# Patient Record
Sex: Female | Born: 1990 | Race: Black or African American | Hispanic: No | Marital: Single | Smoking: Former smoker
Health system: Southern US, Community
[De-identification: ages and names within clinical notes are randomized; demographics above are authoritative.]

## PROBLEM LIST (undated history)

## (undated) DIAGNOSIS — B9689 Other specified bacterial agents as the cause of diseases classified elsewhere: Secondary | ICD-10-CM

## (undated) DIAGNOSIS — N760 Acute vaginitis: Secondary | ICD-10-CM

## (undated) DIAGNOSIS — Z9289 Personal history of other medical treatment: Secondary | ICD-10-CM

## (undated) DIAGNOSIS — J45909 Unspecified asthma, uncomplicated: Secondary | ICD-10-CM

## (undated) DIAGNOSIS — R1115 Cyclical vomiting syndrome unrelated to migraine: Secondary | ICD-10-CM

## (undated) HISTORY — PX: UMBILICAL HERNIA REPAIR: SHX196

## (undated) HISTORY — PX: RIGHT OOPHORECTOMY: SHX2359

## (undated) HISTORY — PX: HERNIA REPAIR: SHX51

---

## 2014-07-27 ENCOUNTER — Emergency Department (HOSPITAL_COMMUNITY)
Admission: EM | Admit: 2014-07-27 | Discharge: 2014-07-27 | Disposition: A | Discharge status: Home / Self Care | Payer: Medicaid - Out of State | Attending: Emergency Medicine | Admitting: Emergency Medicine

## 2014-07-27 ENCOUNTER — Encounter (HOSPITAL_COMMUNITY): Payer: Self-pay

## 2014-07-27 ENCOUNTER — Emergency Department (HOSPITAL_COMMUNITY): Payer: Medicaid - Out of State

## 2014-07-27 DIAGNOSIS — J45909 Unspecified asthma, uncomplicated: Secondary | ICD-10-CM | POA: Diagnosis not present

## 2014-07-27 DIAGNOSIS — Z3A01 Less than 8 weeks gestation of pregnancy: Secondary | ICD-10-CM | POA: Insufficient documentation

## 2014-07-27 DIAGNOSIS — O2341 Unspecified infection of urinary tract in pregnancy, first trimester: Secondary | ICD-10-CM | POA: Diagnosis not present

## 2014-07-27 DIAGNOSIS — F17210 Nicotine dependence, cigarettes, uncomplicated: Secondary | ICD-10-CM | POA: Diagnosis not present

## 2014-07-27 DIAGNOSIS — O99511 Diseases of the respiratory system complicating pregnancy, first trimester: Secondary | ICD-10-CM | POA: Insufficient documentation

## 2014-07-27 DIAGNOSIS — Z3490 Encounter for supervision of normal pregnancy, unspecified, unspecified trimester: Secondary | ICD-10-CM

## 2014-07-27 DIAGNOSIS — O23591 Infection of other part of genital tract in pregnancy, first trimester: Secondary | ICD-10-CM | POA: Diagnosis not present

## 2014-07-27 DIAGNOSIS — O99331 Smoking (tobacco) complicating pregnancy, first trimester: Secondary | ICD-10-CM | POA: Insufficient documentation

## 2014-07-27 DIAGNOSIS — B9689 Other specified bacterial agents as the cause of diseases classified elsewhere: Secondary | ICD-10-CM

## 2014-07-27 DIAGNOSIS — O9989 Other specified diseases and conditions complicating pregnancy, childbirth and the puerperium: Secondary | ICD-10-CM | POA: Diagnosis present

## 2014-07-27 DIAGNOSIS — R102 Pelvic and perineal pain: Secondary | ICD-10-CM

## 2014-07-27 DIAGNOSIS — N390 Urinary tract infection, site not specified: Secondary | ICD-10-CM

## 2014-07-27 DIAGNOSIS — N760 Acute vaginitis: Secondary | ICD-10-CM

## 2014-07-27 HISTORY — DX: Acute vaginitis: N76.0

## 2014-07-27 HISTORY — DX: Other specified bacterial agents as the cause of diseases classified elsewhere: B96.89

## 2014-07-27 HISTORY — DX: Unspecified asthma, uncomplicated: J45.909

## 2014-07-27 LAB — URINALYSIS, ROUTINE W REFLEX MICROSCOPIC
Bilirubin Urine: NEGATIVE
GLUCOSE, UA: NEGATIVE mg/dL
Ketones, ur: NEGATIVE mg/dL
NITRITE: NEGATIVE
PH: 6 (ref 5.0–8.0)
Protein, ur: NEGATIVE mg/dL
Specific Gravity, Urine: 1.029 (ref 1.005–1.030)
Urobilinogen, UA: 0.2 mg/dL (ref 0.0–1.0)

## 2014-07-27 LAB — WET PREP, GENITAL
Trich, Wet Prep: NONE SEEN
YEAST WET PREP: NONE SEEN

## 2014-07-27 LAB — URINE MICROSCOPIC-ADD ON

## 2014-07-27 LAB — I-STAT BETA HCG BLOOD, ED (MC, WL, AP ONLY): I-stat hCG, quantitative: >2000 m[IU]/mL — ABNORMAL HIGH (ref <5)

## 2014-07-27 LAB — POC URINE PREG, ED: PREG TEST UR: POSITIVE — AB

## 2014-07-27 MED ORDER — METRONIDAZOLE 500 MG PO TABS: 500.0000 mg | ORAL_TABLET | Freq: Two times a day (BID) | ORAL | Status: DC

## 2014-07-27 MED ORDER — CEPHALEXIN 250 MG PO CAPS: 250.0000 mg | ORAL_CAPSULE | Freq: Four times a day (QID) | ORAL | Status: DC

## 2014-07-27 NOTE — ED Notes (Signed)
Pt attempted to give sample. Unsuccessful

## 2014-07-27 NOTE — ED Notes (Signed)
Pt dx with BV x 1 1/2 weeks ago.  Unable to complete meds d/t side effects.  Pt continues to have nausea and vaginal discharge.

## 2014-07-27 NOTE — ED Notes (Signed)
US at bedside

## 2014-07-27 NOTE — ED Provider Notes (Signed)
CSN: [id number]     Arrival date & time 07/27/14  [id number] History   First MD Initiated Contact with Patient 07/27/14 1105     Chief Complaint  Patient presents with  . Vaginal Discharge     (Consider location/radiation/quality/duration/timing/severity/associated sxs/prior Treatment) Patient is a 24 y.o. female presenting with vaginal discharge. The history is provided by the patient.  Vaginal Discharge Quality:  Malodorous, yellow and thick Severity:  Moderate Onset quality:  Gradual Duration:  2 weeks Timing:  Constant Progression:  Waxing and waning Chronicity:  Recurrent Context: spontaneously   Relieved by:  Nothing Worsened by:  Nothing tried Ineffective treatments: Took 3 doses of clindamycin given to her last week when she was seen in [place] and diagnosed with BV however because of the severe side effects she was only able to tolerate 3 doses. Associated symptoms: dysuria and vaginal itching   Associated symptoms: no abdominal pain, no fever, no genital lesions, no urinary hesitancy and no urinary incontinence   Risk factors: unprotected sex   Risk factors: no new sexual partner   Risk factors comment:  Prior history of BV. She was diagnosed 2 weeks ago with the same and placed on clindamycin   Past Medical History  Diagnosis Date  . Asthma   . Bacterial vaginitis    Past Surgical History  Procedure Laterality Date  . Hernia repair     History reviewed. No pertinent family history. History  Substance Use Topics  . Smoking status: Current Some Day Smoker  . Smokeless tobacco: Not on file  . Alcohol Use: No   OB History    No data available     Review of Systems  Constitutional: Negative for fever.  Gastrointestinal: Negative for abdominal pain.  Genitourinary: Positive for dysuria and vaginal discharge. Negative for bladder incontinence and hesitancy.  All other systems reviewed and are negative.     Allergies  Review of patient's allergies indicates  no known allergies.  Home Medications   Prior to Admission medications   Medication Sig Start Date End Date Taking? Authorizing Provider  clindamycin (CLEOCIN) 150 MG capsule Take 150 mg by mouth 3 (three) times daily.    Historical Provider, MD   BP 115/76 mmHg  Pulse 71  Temp(Src) 98.1 F (36.7 C) (Oral)  Resp 18  SpO2 100%  LMP 06/26/2014 Physical Exam  Constitutional: She is oriented to person, place, and time. She appears well-developed and well-nourished. No distress.  HENT:  Head: Normocephalic and atraumatic.  Mouth/Throat: Oropharynx is clear and moist.  Eyes: Conjunctivae and EOM are normal. Pupils are equal, round, and reactive to light.  Neck: Normal range of motion. Neck supple.  Cardiovascular: Normal rate, regular rhythm and intact distal pulses.   No murmur heard. Pulmonary/Chest: Effort normal and breath sounds normal. No respiratory distress. She has no wheezes. She has no rales.  Abdominal: Soft. She exhibits no distension. There is no tenderness. There is no rebound and no guarding.  Genitourinary: Uterus normal. Cervix exhibits discharge. Cervix exhibits no motion tenderness and no friability. Right adnexum displays no mass and no tenderness. Left adnexum displays no mass and no tenderness. No tenderness or bleeding in the vagina. Vaginal discharge found.  Musculoskeletal: Normal range of motion. She exhibits no edema or tenderness.  Neurological: She is alert and oriented to person, place, and time.  Skin: Skin is warm and dry. No rash noted. No erythema.  Psychiatric: She has a normal mood and affect. Her behavior is normal.  Nursing  note and vitals reviewed.   ED Course  Procedures (including critical care time) Labs Review Labs Reviewed  WET PREP, GENITAL - Abnormal; Notable for the following:    Clue Cells Wet Prep HPF POC MODERATE (*)    WBC, Wet Prep HPF POC FEW (*)    All other components within normal limits  URINALYSIS, ROUTINE W REFLEX  MICROSCOPIC (NOT AT [facility]) - Abnormal; Notable for the following:    Color, Urine AMBER (*)    APPearance CLOUDY (*)    Hgb urine dipstick SMALL (*)    Leukocytes, UA MODERATE (*)    All other components within normal limits  URINE MICROSCOPIC-ADD ON - Abnormal; Notable for the following:    Squamous Epithelial / LPF FEW (*)    All other components within normal limits  POC URINE PREG, ED - Abnormal; Notable for the following:    Preg Test, Ur POSITIVE (*)    All other components within normal limits  I-STAT BETA HCG BLOOD, ED ([facility], [facility], [facility] ONLY)  GC/CHLAMYDIA PROBE AMP ([place]) NOT AT [facility]    Imaging Review US Ob Comp Less 14 Wks  07/27/2014   CLINICAL DATA:  Pregnancy, discharge, chest pain  EXAM: OBSTETRIC <14 WK [place] AND TRANSVAGINAL OB US  TECHNIQUE: Both transabdominal and transvaginal ultrasound examinations were performed for complete evaluation of the gestation as well as the maternal uterus, adnexal regions, and pelvic cul-de-sac. Transvaginal technique was performed to assess early pregnancy.  COMPARISON:  None.  FINDINGS: Intrauterine gestational sac: Visualized/normal in shape.  Yolk sac:  Present  Embryo:  Presents  Cardiac Activity: Present  Heart Rate: 105  bpm  CRL:  3  mm   5 w   6 d                  [place] EDC: 03/23/2015  Maternal uterus/adnexae: Normal right ovary. Corpus luteum cyst in the left ovary. No adnexal mass. No pelvic free fluid. No subchorionic hemorrhage.  IMPRESSION: 1. Single live intrauterine pregnancy dating 5 weeks 6 days with an ultrasound EDC of 03/23/2015.   Electronically Signed   By: Elige Ko   On: 07/27/2014 16:02   US Ob Transvaginal  07/27/2014   CLINICAL DATA:  Pregnancy, discharge, chest pain  EXAM: OBSTETRIC <14 WK [place] AND TRANSVAGINAL OB US  TECHNIQUE: Both transabdominal and transvaginal ultrasound examinations were performed for complete evaluation of the gestation as well as the maternal uterus, adnexal regions, and pelvic cul-de-sac.  Transvaginal technique was performed to assess early pregnancy.  COMPARISON:  None.  FINDINGS: Intrauterine gestational sac: Visualized/normal in shape.  Yolk sac:  Present  Embryo:  Presents  Cardiac Activity: Present  Heart Rate: 105  bpm  CRL:  3  mm   5 w   6 d                  [place] EDC: 03/23/2015  Maternal uterus/adnexae: Normal right ovary. Corpus luteum cyst in the left ovary. No adnexal mass. No pelvic free fluid. No subchorionic hemorrhage.  IMPRESSION: 1. Single live intrauterine pregnancy dating 5 weeks 6 days with an ultrasound EDC of 03/23/2015.   Electronically Signed   By: Elige Ko   On: 07/27/2014 16:02     EKG Interpretation None      MDM   Final diagnoses:  UTI (lower urinary tract infection)  Pregnancy at early stage  Bacterial vaginosis   patient presents with ongoing vaginal discharge that has only slightly  improved for the last 2 weeks. She was seen in [place] where she normally lives prior to coming here however she was given clindamycin for BV which she can only tolerate 3 doses and she continues to have the discharge. She denies any new sexual partners but does not use protection. She denies any abdominal pain fever or vomiting but has had some mild nausea. LMP was 3 weeks ago.  On exam patient has no abdominal pain or flank tenderness. Low suspicion for pyelonephritis or PID at this time. Will repeat pelvic exam  3:00 PM Discharge without localized tenderness or concern for PID. Patient's UA is consistent with pregnancy which would explain the excessive vaginal discharge or mild nausea. Will do an ultrasound to further identify. UA also consistent with a UTI.  Will treat with abd.  HCG and U/S pending 4:10 PM Ultrasound consistent with a 5 week 6 day pregnancy. No ectopic identified. Patient treated for BV and for UTI. She can follow-up with OB when she gets back to [facility], MD 07/27/14 1610

## 2014-07-28 LAB — GC/CHLAMYDIA PROBE AMP (~~LOC~~) NOT AT ARMC
CHLAMYDIA, DNA PROBE: NEGATIVE
NEISSERIA GONORRHEA: NEGATIVE

## 2014-09-05 ENCOUNTER — Emergency Department (HOSPITAL_COMMUNITY)
Admission: EM | Admit: 2014-09-05 | Discharge: 2014-09-05 | Disposition: A | Discharge status: Home / Self Care | Payer: Medicaid - Out of State | Attending: Emergency Medicine | Admitting: Emergency Medicine

## 2014-09-05 ENCOUNTER — Emergency Department (HOSPITAL_COMMUNITY)
Admission: EM | Admit: 2014-09-05 | Discharge: 2014-09-05 | Disposition: A | Discharge status: Home / Self Care | Payer: Medicaid - Out of State | Attending: Physician Assistant | Admitting: Physician Assistant

## 2014-09-05 ENCOUNTER — Encounter (HOSPITAL_COMMUNITY): Payer: Self-pay

## 2014-09-05 DIAGNOSIS — N760 Acute vaginitis: Secondary | ICD-10-CM | POA: Insufficient documentation

## 2014-09-05 DIAGNOSIS — Z3202 Encounter for pregnancy test, result negative: Secondary | ICD-10-CM | POA: Diagnosis not present

## 2014-09-05 DIAGNOSIS — Z720 Tobacco use: Secondary | ICD-10-CM | POA: Insufficient documentation

## 2014-09-05 DIAGNOSIS — Z792 Long term (current) use of antibiotics: Secondary | ICD-10-CM | POA: Diagnosis not present

## 2014-09-05 DIAGNOSIS — B9689 Other specified bacterial agents as the cause of diseases classified elsewhere: Secondary | ICD-10-CM

## 2014-09-05 DIAGNOSIS — J45909 Unspecified asthma, uncomplicated: Secondary | ICD-10-CM | POA: Diagnosis not present

## 2014-09-05 DIAGNOSIS — R102 Pelvic and perineal pain: Secondary | ICD-10-CM | POA: Diagnosis present

## 2014-09-05 LAB — URINALYSIS, ROUTINE W REFLEX MICROSCOPIC
BILIRUBIN URINE: NEGATIVE
GLUCOSE, UA: NEGATIVE mg/dL
Hgb urine dipstick: NEGATIVE
KETONES UR: NEGATIVE mg/dL
NITRITE: NEGATIVE
PROTEIN: NEGATIVE mg/dL
Specific Gravity, Urine: 1.025 (ref 1.005–1.030)
Urobilinogen, UA: 0.2 mg/dL (ref 0.0–1.0)
pH: 7 (ref 5.0–8.0)

## 2014-09-05 LAB — WET PREP, GENITAL
Trich, Wet Prep: NONE SEEN
Yeast Wet Prep HPF POC: NONE SEEN

## 2014-09-05 LAB — URINE MICROSCOPIC-ADD ON

## 2014-09-05 LAB — PREGNANCY, URINE: Preg Test, Ur: NEGATIVE

## 2014-09-05 MED ORDER — METRONIDAZOLE 500 MG PO TABS: 500.0000 mg | ORAL_TABLET | Freq: Two times a day (BID) | ORAL | Status: DC

## 2014-09-05 NOTE — Discharge Instructions (Signed)
Bacterial Vaginosis Bacterial vaginosis is a vaginal infection that occurs when the normal balance of bacteria in the vagina is disrupted. It results from an overgrowth of certain bacteria. This is the most common vaginal infection in women of childbearing age. Treatment is important to prevent complications, especially in pregnant women, as it can cause a premature delivery. CAUSES  Bacterial vaginosis is caused by an increase in harmful bacteria that are normally present in smaller amounts in the vagina. Several different kinds of bacteria can cause bacterial vaginosis. However, the reason that the condition develops is not fully understood. RISK FACTORS Certain activities or behaviors can put you at an increased risk of developing bacterial vaginosis, including:  Having a new sex partner or multiple sex partners.  Douching.  Using an intrauterine device (IUD) for contraception. Women do not get bacterial vaginosis from toilet seats, bedding, swimming pools, or contact with objects around them. SIGNS AND SYMPTOMS  Some women with bacterial vaginosis have no signs or symptoms. Common symptoms include:  Grey vaginal discharge.  A fishlike odor with discharge, especially after sexual intercourse.  Itching or burning of the vagina and vulva.  Burning or pain with urination. DIAGNOSIS  Your health care provider will take a medical history and examine the vagina for signs of bacterial vaginosis. A sample of vaginal fluid may be taken. Your health care provider will look at this sample under a microscope to check for bacteria and abnormal cells. A vaginal pH test may also be done.  TREATMENT  Bacterial vaginosis may be treated with antibiotic medicines. These may be given in the form of a pill or a vaginal cream. A second round of antibiotics may be prescribed if the condition comes back after treatment.  HOME CARE INSTRUCTIONS   Only take over-the-counter or prescription medicines as  directed by your health care provider.  If antibiotic medicine was prescribed, take it as directed. Make sure you finish it even if you start to feel better.  Do not have sex until treatment is completed.  Tell all sexual partners that you have a vaginal infection. They should see their health care provider and be treated if they have problems, such as a mild rash or itching.  Practice safe sex by using condoms and only having one sex partner. SEEK MEDICAL CARE IF:   Your symptoms are not improving after 3 days of treatment.  You have increased discharge or pain.  You have a fever. MAKE SURE YOU:   Understand these instructions.  Will watch your condition.  Will get help right away if you are not doing well or get worse. FOR MORE INFORMATION  Centers for Disease Control and Prevention, Division of STD Prevention: www.cdc.gov/std American Sexual Health Association (ASHA): www.ashastd.org  Document Released: 12/31/2004 Document Revised: 10/21/2012 Document Reviewed: 08/12/2012 ExitCare Patient Information 2015 [employer]. This information is not intended to replace advice given to you by your health care provider. Make sure you discuss any questions you have with your health care provider.  

## 2014-09-05 NOTE — ED Notes (Signed)
Pt is prone to getting bv with swimming, change in soaps.  Pt having same symptoms.  Burning, itching pain.  Worsening last night.

## 2014-09-05 NOTE — ED Notes (Signed)
Called patient in waiting room multiple times with no answer.

## 2014-09-05 NOTE — ED Notes (Signed)
Called out patient to be triaged. No response

## 2014-09-05 NOTE — ED Provider Notes (Signed)
CSN: [id number]     Arrival date & time 09/05/14  1615 History   First MD Initiated Contact with Patient 09/05/14 1856     Chief Complaint  Patient presents with  . Vaginal Pain     (Consider location/radiation/quality/duration/timing/severity/associated sxs/prior Treatment) HPI Comments: Patient presents to the emergency department with a chief complaint of vaginal discharge, irritation, and history of bacterial vaginitis. Patient states that she began having the symptoms over the past 2 days. It worsened last night. She reports some associated dysuria. She believes that she got this from swimming. She is sexually active. She denies any new sexual partners. There are no aggravating or relieving factors. She denies any fevers, chills, nausea, vomiting, or abdominal pain. She also reports that she has a history of gonorrhea.  The history is provided by the patient. No language interpreter was used.    Past Medical History  Diagnosis Date  . Asthma   . Bacterial vaginitis    Past Surgical History  Procedure Laterality Date  . Hernia repair     History reviewed. No pertinent family history. Social History  Substance Use Topics  . Smoking status: Current Some Day Smoker  . Smokeless tobacco: None  . Alcohol Use: No   OB History    No data available     Review of Systems  Constitutional: Negative for fever and chills.  Respiratory: Negative for shortness of breath.   Cardiovascular: Negative for chest pain.  Gastrointestinal: Negative for nausea, vomiting, diarrhea and constipation.  Genitourinary: Positive for dysuria and vaginal discharge.  All other systems reviewed and are negative.     Allergies  Review of patient's allergies indicates no known allergies.  Home Medications   Prior to Admission medications   Medication Sig Start Date End Date Taking? Authorizing Provider  cephALEXin (KEFLEX) 250 MG capsule Take 1 capsule (250 mg total) by mouth 4 (four) times  daily. 07/27/14   Gwyneth Sprout, MD  clindamycin (CLEOCIN) 150 MG capsule Take 150 mg by mouth 3 (three) times daily.    Historical Provider, MD  metroNIDAZOLE (FLAGYL) 500 MG tablet Take 1 tablet (500 mg total) by mouth 2 (two) times daily. 07/27/14   Gwyneth Sprout, MD   BP 103/67 mmHg  Pulse 88  Temp(Src) 97.9 F (36.6 C) (Oral)  Resp 16  SpO2 100%  LMP 08/08/2014 Physical Exam  Constitutional: She is oriented to person, place, and time. She appears well-developed and well-nourished.  HENT:  Head: Normocephalic and atraumatic.  Eyes: Conjunctivae and EOM are normal. Pupils are equal, round, and reactive to light.  Neck: Normal range of motion. Neck supple.  Cardiovascular: Normal rate and regular rhythm.  Exam reveals no gallop and no friction rub.   No murmur heard. Pulmonary/Chest: Effort normal and breath sounds normal. No respiratory distress. She has no wheezes. She has no rales. She exhibits no tenderness.  Abdominal: Soft. Bowel sounds are normal. She exhibits no distension and no mass. There is no tenderness. There is no rebound and no guarding.  Genitourinary:  Pelvic exam chaperoned by female ER tech, no right or left adnexal tenderness, no uterine tenderness, moderate vaginal discharge, no bleeding, no CMT or friability, no foreign body, no injury to the external genitalia, no other significant findings   Musculoskeletal: Normal range of motion. She exhibits no edema or tenderness.  Neurological: She is alert and oriented to person, place, and time.  Skin: Skin is warm and dry.  Psychiatric: She has a normal mood and affect.  Her behavior is normal. Judgment and thought content normal.  Nursing note and vitals reviewed.   ED Course  Procedures (including critical care time) Labs Review Labs Reviewed  WET PREP, GENITAL  URINALYSIS, ROUTINE W REFLEX MICROSCOPIC (NOT AT [facility])  PREGNANCY, URINE  GC/CHLAMYDIA PROBE AMP ([place]) NOT AT [facility]    Imaging  Review No results found. I have personally reviewed and evaluated these images and lab results as part of my medical decision-making.   EKG Interpretation None      MDM   Final diagnoses:  BV (bacterial vaginosis)    Patient with vaginal discharge and dysuria, will check urinalysis and pelvic exam.  Wet prep remarkable for moderate clue cells. Will discharge with metronidazole. Patient does not have any abdominal pain on exam. Vital signs are stable. Discharge to home. Instructed to follow-up with OB/GYN. Patient understands and agrees to plan. She is stable and ready for discharge.    Roxy Horseman, PA-C 09/05/14 2342  Courteney Randall An, MD 09/05/14 1610

## 2014-09-06 LAB — GC/CHLAMYDIA PROBE AMP (~~LOC~~) NOT AT ARMC
CHLAMYDIA, DNA PROBE: NEGATIVE
NEISSERIA GONORRHEA: NEGATIVE

## 2014-11-07 ENCOUNTER — Emergency Department (HOSPITAL_COMMUNITY)
Admission: EM | Admit: 2014-11-07 | Discharge: 2014-11-07 | Disposition: A | Discharge status: Home / Self Care | Payer: Medicaid - Out of State | Attending: Emergency Medicine | Admitting: Emergency Medicine

## 2014-11-07 ENCOUNTER — Encounter (HOSPITAL_COMMUNITY): Payer: Self-pay | Admitting: Cardiology

## 2014-11-07 DIAGNOSIS — N760 Acute vaginitis: Secondary | ICD-10-CM

## 2014-11-07 DIAGNOSIS — B373 Candidiasis of vulva and vagina: Secondary | ICD-10-CM | POA: Diagnosis not present

## 2014-11-07 DIAGNOSIS — B9689 Other specified bacterial agents as the cause of diseases classified elsewhere: Secondary | ICD-10-CM

## 2014-11-07 DIAGNOSIS — Z87891 Personal history of nicotine dependence: Secondary | ICD-10-CM | POA: Diagnosis not present

## 2014-11-07 DIAGNOSIS — L293 Anogenital pruritus, unspecified: Secondary | ICD-10-CM | POA: Diagnosis present

## 2014-11-07 DIAGNOSIS — J45909 Unspecified asthma, uncomplicated: Secondary | ICD-10-CM | POA: Diagnosis not present

## 2014-11-07 DIAGNOSIS — B379 Candidiasis, unspecified: Secondary | ICD-10-CM

## 2014-11-07 LAB — URINALYSIS, ROUTINE W REFLEX MICROSCOPIC
Bilirubin Urine: NEGATIVE
GLUCOSE, UA: NEGATIVE mg/dL
Ketones, ur: NEGATIVE mg/dL
Nitrite: NEGATIVE
PH: 8 (ref 5.0–8.0)
Protein, ur: NEGATIVE mg/dL
SPECIFIC GRAVITY, URINE: 1.011 (ref 1.005–1.030)
Urobilinogen, UA: 0.2 mg/dL (ref 0.0–1.0)

## 2014-11-07 LAB — URINE MICROSCOPIC-ADD ON

## 2014-11-07 LAB — WET PREP, GENITAL: TRICH WET PREP: NONE SEEN

## 2014-11-07 LAB — POC URINE PREG, ED: Preg Test, Ur: POSITIVE — AB

## 2014-11-07 MED ORDER — FLUCONAZOLE 100 MG PO TABS
150.0000 mg | ORAL_TABLET | Freq: Every day | ORAL | Status: DC
  Administered 2014-11-07: 150 mg via ORAL
  Filled 2014-11-07: qty 2

## 2014-11-07 MED ORDER — METRONIDAZOLE 500 MG PO TABS: 500.0000 mg | ORAL_TABLET | Freq: Two times a day (BID) | ORAL | Status: DC

## 2014-11-07 NOTE — ED Notes (Signed)
Per pt, pt had miscarriage recently and had D&C last week.

## 2014-11-07 NOTE — ED Notes (Signed)
Reports vaginal itching that started yesterday after she went swimming.

## 2014-11-07 NOTE — ED Provider Notes (Signed)
CSN: [id number]     Arrival date & time 11/07/14  1000 History   First MD Initiated Contact with Patient 11/07/14 1113     Chief Complaint  Patient presents with  . Vaginal Itching   HPI  Ms. Krista Stout is a 24 year old female with PMHx of asthma presenting with vaginal itching and discharge. Pt states these symptoms began yesterday after swimming. Discharge is yellow and thin. She states that she frequently get BV and this feels similar. She has not tried any OTC symptom relievers. She denies fever, chills, abdominal pain, nausea, vomiting, diarrhea, dysuria or hematuria. Pt reports that she had a miscarriage last week and had a D&C performed at the time. She reports no complications from the procedure except for occasional light spotting.   Past Medical History  Diagnosis Date  . Asthma   . Bacterial vaginitis    Past Surgical History  Procedure Laterality Date  . Hernia repair     History reviewed. No pertinent family history. Social History  Substance Use Topics  . Smoking status: Former Games developer  . Smokeless tobacco: None  . Alcohol Use: No   OB History    No data available     Review of Systems  Constitutional: Negative for fever and chills.  Gastrointestinal: Negative for nausea, vomiting and abdominal pain.  Genitourinary: Positive for vaginal bleeding, vaginal discharge and vaginal pain (itching). Negative for dysuria, hematuria and flank pain.  Skin: Negative for rash.      Allergies  Review of patient's allergies indicates no known allergies.  Home Medications   Prior to Admission medications   Medication Sig Start Date End Date Taking? Authorizing Provider  cephALEXin (KEFLEX) 250 MG capsule Take 1 capsule (250 mg total) by mouth 4 (four) times daily. Patient not taking: Reported on 09/05/2014 07/27/14   Gwyneth Sprout, MD  metroNIDAZOLE (FLAGYL) 500 MG tablet Take 1 tablet (500 mg total) by mouth 2 (two) times daily. 11/07/14   Rebeca Valdivia, PA-C   BP 115/64  mmHg  Pulse 61  Temp(Src) 98.5 F (36.9 C) (Oral)  Resp 17  Ht  (1.575 m)  Wt 134 lb (60.782 kg)  BMI 24.50 kg/m2  SpO2 100%  LMP 11/05/2014 Physical Exam  Constitutional: She appears well-developed and well-nourished. No distress.  HENT:  Head: Normocephalic and atraumatic.  Eyes: Conjunctivae are normal. Right eye exhibits no discharge. Left eye exhibits no discharge. No scleral icterus.  Neck: Normal range of motion.  Cardiovascular: Normal rate and regular rhythm.   Pulmonary/Chest: Effort normal. No respiratory distress.  Abdominal: Soft. Bowel sounds are normal. She exhibits no distension. There is no tenderness. There is no rebound and no guarding.  Genitourinary: There is no rash or lesion on the right labia. There is no rash or lesion on the left labia. Cervix exhibits no motion tenderness, no discharge and no friability. Right adnexum displays no mass and no tenderness. Left adnexum displays no mass and no tenderness. No erythema or bleeding in the vagina. Vaginal discharge (thin, white) found.  Musculoskeletal: Normal range of motion.  Moves extremities spontaneously  Neurological: She is alert. Coordination normal.  Skin: Skin is warm and dry.  Psychiatric: She has a normal mood and affect. Her behavior is normal.  Nursing note and vitals reviewed.   ED Course  Procedures (including critical care time) Labs Review Labs Reviewed  WET PREP, GENITAL - Abnormal; Notable for the following:    Yeast Wet Prep HPF POC FEW (*)  Clue Cells Wet Prep HPF POC FEW (*)    WBC, Wet Prep HPF POC FEW (*)    All other components within normal limits  URINALYSIS, ROUTINE W REFLEX MICROSCOPIC (NOT AT [facility]) - Abnormal; Notable for the following:    Hgb urine dipstick TRACE (*)    Leukocytes, UA SMALL (*)    All other components within normal limits  POC URINE PREG, ED - Abnormal; Notable for the following:    Preg Test, Ur POSITIVE (*)    All other components within normal  limits  URINE MICROSCOPIC-ADD ON  GC/CHLAMYDIA PROBE AMP ([place]) NOT AT [facility]    Imaging Review No results found. I have personally reviewed and evaluated these images and lab results as part of my medical decision-making.   EKG Interpretation None      MDM   Final diagnoses:  BV (bacterial vaginosis)  Yeast infection   Pt presenting with 1 day of vaginal itching and discharge. Pt has hx of frequent BV and states this feels similar. Also, recent history of miscarriage s/p D&C. VSS. Pt non-toxic appearing. Abdomen is soft, non-tender without peritoneal signs. On pelvic, white, thin vaginal discharge present. No CMT or vaginal bleeding noted. Wet prep positive for clue cells and yeast. Treated with 150 mg diflucan in ED. Will treat BV with flagyl. Return precautions given in discharge paperwork and discussed with pt at bedside. Pt stable for discharge     Alveta HeimlichStevi Zuleyka Kloc, PA-C 11/07/14 1357  Elwin MochaBlair Walden, MD 11/07/14 [phone]

## 2014-11-07 NOTE — Discharge Instructions (Signed)

## 2014-11-07 NOTE — ED Notes (Signed)
Pt from home for eval of vaginal discharge, itching, and discomfort in vagina. Pt states went swimming yesterday and that's when the symtpoms began. Pt states has had BV in the past and feels that this is same. nad noted. Denies any abd pain. No n/v/d .

## 2014-11-08 LAB — GC/CHLAMYDIA PROBE AMP (~~LOC~~) NOT AT ARMC
Chlamydia: NEGATIVE
Neisseria Gonorrhea: NEGATIVE

## 2015-04-15 ENCOUNTER — Encounter (HOSPITAL_COMMUNITY): Payer: Self-pay | Admitting: Emergency Medicine

## 2015-04-15 ENCOUNTER — Emergency Department (HOSPITAL_COMMUNITY)
Admission: EM | Admit: 2015-04-15 | Discharge: 2015-04-15 | Disposition: A | Discharge status: Home / Self Care | Payer: Medicaid - Out of State | Attending: Emergency Medicine | Admitting: Emergency Medicine

## 2015-04-15 DIAGNOSIS — Z8742 Personal history of other diseases of the female genital tract: Secondary | ICD-10-CM | POA: Diagnosis not present

## 2015-04-15 DIAGNOSIS — Z3202 Encounter for pregnancy test, result negative: Secondary | ICD-10-CM | POA: Insufficient documentation

## 2015-04-15 DIAGNOSIS — R109 Unspecified abdominal pain: Secondary | ICD-10-CM | POA: Diagnosis present

## 2015-04-15 DIAGNOSIS — R110 Nausea: Secondary | ICD-10-CM | POA: Diagnosis not present

## 2015-04-15 DIAGNOSIS — J45909 Unspecified asthma, uncomplicated: Secondary | ICD-10-CM | POA: Insufficient documentation

## 2015-04-15 DIAGNOSIS — Z87891 Personal history of nicotine dependence: Secondary | ICD-10-CM | POA: Insufficient documentation

## 2015-04-15 DIAGNOSIS — Z9889 Other specified postprocedural states: Secondary | ICD-10-CM | POA: Insufficient documentation

## 2015-04-15 DIAGNOSIS — R1084 Generalized abdominal pain: Secondary | ICD-10-CM | POA: Insufficient documentation

## 2015-04-15 LAB — CBC WITH DIFFERENTIAL/PLATELET
BASOS PCT: 1 %
Basophils Absolute: 0 10*3/uL (ref 0.0–0.1)
EOS ABS: 0.1 10*3/uL (ref 0.0–0.7)
EOS PCT: 2 %
HEMATOCRIT: 39.6 % (ref 36.0–46.0)
Hemoglobin: 13.7 g/dL (ref 12.0–15.0)
Lymphocytes Relative: 37 %
Lymphs Abs: 3 10*3/uL (ref 0.7–4.0)
MCH: 31.4 pg (ref 26.0–34.0)
MCHC: 34.6 g/dL (ref 30.0–36.0)
MCV: 90.8 fL (ref 78.0–100.0)
MONO ABS: 0.5 10*3/uL (ref 0.1–1.0)
MONOS PCT: 6 %
Neutro Abs: 4.5 10*3/uL (ref 1.7–7.7)
Neutrophils Relative %: 56 %
Platelets: 182 10*3/uL (ref 150–400)
RBC: 4.36 MIL/uL (ref 3.87–5.11)
RDW: 12.8 % (ref 11.5–15.5)
WBC: 8.1 10*3/uL (ref 4.0–10.5)

## 2015-04-15 LAB — URINALYSIS, ROUTINE W REFLEX MICROSCOPIC
Bilirubin Urine: NEGATIVE
Glucose, UA: NEGATIVE mg/dL
LEUKOCYTES UA: NEGATIVE
NITRITE: NEGATIVE
PH: 6 (ref 5.0–8.0)
PROTEIN: NEGATIVE mg/dL
Specific Gravity, Urine: 1.027 (ref 1.005–1.030)

## 2015-04-15 LAB — COMPREHENSIVE METABOLIC PANEL
ALBUMIN: 4.4 g/dL (ref 3.5–5.0)
ALT: 15 U/L (ref 14–54)
ANION GAP: 8 (ref 5–15)
AST: 21 U/L (ref 15–41)
Alkaline Phosphatase: 56 U/L (ref 38–126)
BILIRUBIN TOTAL: 0.4 mg/dL (ref 0.3–1.2)
BUN: 12 mg/dL (ref 6–20)
CO2: 24 mmol/L (ref 22–32)
Calcium: 8.8 mg/dL — ABNORMAL LOW (ref 8.9–10.3)
Chloride: 107 mmol/L (ref 101–111)
Creatinine, Ser: 0.83 mg/dL (ref 0.44–1.00)
GFR calc Af Amer: >60 mL/min (ref >60)
GFR calc non Af Amer: >60 mL/min (ref >60)
GLUCOSE: 129 mg/dL — AB (ref 65–99)
POTASSIUM: 3.4 mmol/L — AB (ref 3.5–5.1)
SODIUM: 139 mmol/L (ref 135–145)
TOTAL PROTEIN: 7.4 g/dL (ref 6.5–8.1)

## 2015-04-15 LAB — URINE MICROSCOPIC-ADD ON: SQUAMOUS EPITHELIAL / LPF: NONE SEEN

## 2015-04-15 LAB — I-STAT BETA HCG BLOOD, ED (MC, WL, AP ONLY): I-stat hCG, quantitative: <5 m[IU]/mL (ref <5)

## 2015-04-15 LAB — LIPASE, BLOOD: Lipase: 23 U/L (ref 11–51)

## 2015-04-15 MED ORDER — ONDANSETRON HCL 4 MG/2ML IJ SOLN
4.0000 mg | Freq: Once | INTRAMUSCULAR | Status: AC
  Administered 2015-04-15: 4 mg via INTRAVENOUS
  Filled 2015-04-15: qty 2

## 2015-04-15 MED ORDER — SODIUM CHLORIDE 0.9 % IV BOLUS (SEPSIS)
1000.0000 mL | Freq: Once | INTRAVENOUS | Status: AC
  Administered 2015-04-15: 1000 mL via INTRAVENOUS

## 2015-04-15 NOTE — ED Notes (Signed)
Awake. Verbally responsive. A/O x4. Resp even and unlabored. No audible adventitious breath sounds noted. ABC's intact.  

## 2015-04-15 NOTE — ED Provider Notes (Signed)
CSN: [id number]     Arrival date & time 04/15/15  [id number] History   First MD Initiated Contact with Patient 04/15/15 0818     Chief Complaint  Patient presents with  . Abdominal Pain     (Consider location/radiation/quality/duration/timing/severity/associated sxs/prior Treatment) HPI Comments: Patient presents with abdominal pain. She is a 25 year old female patient who arrived via EMS. She states that about 15-20 minutes ago she started having abdominal pain that's completely throughout her abdomen. She states it hurts all the way from her boobs, and to her bladder. She's had some nausea but no vomiting. She's having normal bowel movements. No fevers. No urinary symptoms. She denies any vaginal bleeding or discharge. She did not take anything for the pain.   Past Medical History  Diagnosis Date  . Asthma   . Bacterial vaginitis    Past Surgical History  Procedure Laterality Date  . Hernia repair     History reviewed. No pertinent family history. Social History  Substance Use Topics  . Smoking status: Former Games developermoker  . Smokeless tobacco: None  . Alcohol Use: No   OB History    No data available     Review of Systems  Constitutional: Negative for fever, chills, diaphoresis and fatigue.  HENT: Negative for congestion, rhinorrhea and sneezing.   Eyes: Negative.   Respiratory: Negative for cough, chest tightness and shortness of breath.   Cardiovascular: Negative for chest pain and leg swelling.  Gastrointestinal: Positive for nausea and abdominal pain. Negative for vomiting, diarrhea and blood in stool.  Genitourinary: Negative for frequency, hematuria, flank pain and difficulty urinating.  Musculoskeletal: Negative for back pain and arthralgias.  Skin: Negative for rash.  Neurological: Negative for dizziness, speech difficulty, weakness, numbness and headaches.      Allergies  Review of patient's allergies indicates no known allergies.  Home Medications   Prior to  Admission medications   Not on File   BP 113/68 mmHg  Pulse 63  Temp(Src) 98.2 F (36.8 C) (Oral)  Resp 16  SpO2 100%  LMP 04/01/2015 (Exact Date) Physical Exam  Constitutional: She is oriented to person, place, and time. She appears well-developed and well-nourished.  HENT:  Head: Normocephalic and atraumatic.  Eyes: Pupils are equal, round, and reactive to light.  Neck: Normal range of motion. Neck supple.  Cardiovascular: Normal rate, regular rhythm and normal heart sounds.   Pulmonary/Chest: Effort normal and breath sounds normal. No respiratory distress. She has no wheezes. She has no rales. She exhibits no tenderness.  Abdominal: Soft. Bowel sounds are normal. There is tenderness (Mild diffuse tenderness throughout the abdomen). There is no rebound and no guarding.  Musculoskeletal: Normal range of motion. She exhibits no edema.  Lymphadenopathy:    She has no cervical adenopathy.  Neurological: She is alert and oriented to person, place, and time.  Skin: Skin is warm and dry. No rash noted.  Psychiatric: She has a normal mood and affect.    ED Course  Procedures (including critical care time) Labs Review Labs Reviewed  COMPREHENSIVE METABOLIC PANEL - Abnormal; Notable for the following:    Potassium 3.4 (*)    Glucose, Bld 129 (*)    Calcium 8.8 (*)    All other components within normal limits  URINALYSIS, ROUTINE W REFLEX MICROSCOPIC (NOT AT [facility]) - Abnormal; Notable for the following:    Hgb urine dipstick TRACE (*)    Ketones, ur >80 (*)    All other components within normal limits  URINE MICROSCOPIC-ADD  ON - Abnormal; Notable for the following:    Bacteria, UA RARE (*)    All other components within normal limits  CBC WITH DIFFERENTIAL/PLATELET  LIPASE, BLOOD  I-STAT BETA HCG BLOOD, ED ([facility], [facility], [facility] ONLY)    Imaging Review No results found. I have personally reviewed and evaluated these images and lab results as part of my medical decision-making.   EKG  Interpretation None      MDM   Final diagnoses:  Generalized abdominal pain    Patient presents with generalized abdominal pain that started 15-20 minutes prior to arrival. Her labs are unremarkable. She was given a dose of Zofran and states she is feeling completely better. Her abdominal exam is benign without tenderness. Her urine is negative. Her pregnancy test is negative. She denies any vaginal complaints. She was discharged home in good condition. She was advised to use a clear liquid diet for the next 24 hours. She was advised to return here if she has any worsening symptoms.    Rolan Bucco, MD 04/15/15 1038

## 2015-04-15 NOTE — Discharge Instructions (Signed)

## 2015-04-15 NOTE — ED Notes (Signed)
Bed: WA07 Expected date: 04/15/15 Expected time: 7:56 AM Means of arrival: Ambulance Comments: Abd pain

## 2015-04-15 NOTE — ED Notes (Signed)
Pt arrived via EMS with report of generalized abd pain with n/v x1 and denies diarrhea and fever.

## 2015-04-15 NOTE — ED Notes (Signed)
PT aware of urine sample 

## 2015-08-12 ENCOUNTER — Emergency Department (HOSPITAL_COMMUNITY)
Admission: EM | Admit: 2015-08-12 | Discharge: 2015-08-12 | Disposition: A | Discharge status: Home / Self Care | Payer: Medicaid - Out of State | Attending: Emergency Medicine | Admitting: Emergency Medicine

## 2015-08-12 ENCOUNTER — Encounter (HOSPITAL_COMMUNITY): Payer: Self-pay

## 2015-08-12 DIAGNOSIS — Z3A08 8 weeks gestation of pregnancy: Secondary | ICD-10-CM | POA: Diagnosis not present

## 2015-08-12 DIAGNOSIS — Z87891 Personal history of nicotine dependence: Secondary | ICD-10-CM | POA: Insufficient documentation

## 2015-08-12 DIAGNOSIS — R197 Diarrhea, unspecified: Secondary | ICD-10-CM | POA: Insufficient documentation

## 2015-08-12 DIAGNOSIS — J45909 Unspecified asthma, uncomplicated: Secondary | ICD-10-CM | POA: Diagnosis not present

## 2015-08-12 DIAGNOSIS — O219 Vomiting of pregnancy, unspecified: Secondary | ICD-10-CM | POA: Diagnosis present

## 2015-08-12 DIAGNOSIS — O210 Mild hyperemesis gravidarum: Secondary | ICD-10-CM | POA: Insufficient documentation

## 2015-08-12 LAB — URINALYSIS, ROUTINE W REFLEX MICROSCOPIC
GLUCOSE, UA: NEGATIVE mg/dL
HGB URINE DIPSTICK: NEGATIVE
Ketones, ur: >80 mg/dL — AB
Leukocytes, UA: NEGATIVE
Nitrite: NEGATIVE
PH: 6 (ref 5.0–8.0)
Protein, ur: 30 mg/dL — AB
SPECIFIC GRAVITY, URINE: 1.037 — AB (ref 1.005–1.030)

## 2015-08-12 LAB — URINE MICROSCOPIC-ADD ON

## 2015-08-12 LAB — COMPREHENSIVE METABOLIC PANEL
ALT: 19 U/L (ref 14–54)
AST: 22 U/L (ref 15–41)
Albumin: 4.8 g/dL (ref 3.5–5.0)
Alkaline Phosphatase: 64 U/L (ref 38–126)
Anion gap: 14 (ref 5–15)
BILIRUBIN TOTAL: 1.2 mg/dL (ref 0.3–1.2)
BUN: 7 mg/dL (ref 6–20)
CHLORIDE: 102 mmol/L (ref 101–111)
CO2: 21 mmol/L — ABNORMAL LOW (ref 22–32)
Calcium: 9.9 mg/dL (ref 8.9–10.3)
Creatinine, Ser: 0.79 mg/dL (ref 0.44–1.00)
Glucose, Bld: 92 mg/dL (ref 65–99)
POTASSIUM: 3.1 mmol/L — AB (ref 3.5–5.1)
Sodium: 137 mmol/L (ref 135–145)
TOTAL PROTEIN: 8 g/dL (ref 6.5–8.1)

## 2015-08-12 LAB — CBC
HEMATOCRIT: 46.2 % — AB (ref 36.0–46.0)
Hemoglobin: 16.3 g/dL — ABNORMAL HIGH (ref 12.0–15.0)
MCH: 32.3 pg (ref 26.0–34.0)
MCHC: 35.3 g/dL (ref 30.0–36.0)
MCV: 91.7 fL (ref 78.0–100.0)
PLATELETS: 264 10*3/uL (ref 150–400)
RBC: 5.04 MIL/uL (ref 3.87–5.11)
RDW: 12.5 % (ref 11.5–15.5)
WBC: 7.6 10*3/uL (ref 4.0–10.5)

## 2015-08-12 LAB — CBG MONITORING, ED: GLUCOSE-CAPILLARY: 106 mg/dL — AB (ref 65–99)

## 2015-08-12 LAB — LIPASE, BLOOD: LIPASE: 18 U/L (ref 11–51)

## 2015-08-12 LAB — I-STAT BETA HCG BLOOD, ED (MC, WL, AP ONLY)

## 2015-08-12 LAB — HCG, QUANTITATIVE, PREGNANCY: hCG, Beta Chain, Quant, S: 59893 m[IU]/mL — ABNORMAL HIGH (ref <5)

## 2015-08-12 MED ORDER — ONDANSETRON 4 MG PO TBDP: 4.0000 mg | ORAL_TABLET | Freq: Three times a day (TID) | ORAL | 0 refills | Status: DC | PRN

## 2015-08-12 MED ORDER — ONDANSETRON HCL 4 MG/2ML IJ SOLN
4.0000 mg | Freq: Once | INTRAMUSCULAR | Status: AC
  Administered 2015-08-12: 4 mg via INTRAVENOUS
  Filled 2015-08-12: qty 2

## 2015-08-12 MED ORDER — SODIUM CHLORIDE 0.9 % IV BOLUS (SEPSIS)
1000.0000 mL | Freq: Once | INTRAVENOUS | Status: AC
  Administered 2015-08-12: 1000 mL via INTRAVENOUS

## 2015-08-12 MED ORDER — POTASSIUM CHLORIDE CRYS ER 20 MEQ PO TBCR
40.0000 meq | EXTENDED_RELEASE_TABLET | Freq: Once | ORAL | Status: AC
  Administered 2015-08-12: 40 meq via ORAL
  Filled 2015-08-12: qty 2

## 2015-08-12 MED ORDER — METOCLOPRAMIDE HCL 10 MG PO TABS: 10.0000 mg | ORAL_TABLET | Freq: Four times a day (QID) | ORAL | 0 refills | Status: DC | PRN

## 2015-08-12 MED ORDER — ONDANSETRON HCL 4 MG/2ML IJ SOLN: 4.0000 mg | Freq: Once | INTRAMUSCULAR | Status: DC

## 2015-08-12 NOTE — ED Notes (Signed)
Pt is in stable condition upon d/c and ambulates from ED. 

## 2015-08-12 NOTE — ED Triage Notes (Addendum)
Patient here complaining of weakness with vomiting and diarrhea x 4 days. States that she is 6-[redacted] weeks pregnant and cant keep anything down. Dry heaves on arrival,.skin warm and dry, family with patient

## 2015-08-12 NOTE — Discharge Instructions (Signed)
Call the [facility] outpatient clinic to schedule a follow up appointment as soon as possible. I gave you two different prescriptions for nausea medicine. Zofran is what you had in the emergency room today. However, if it is too expensive, fill the Reglan which is on the $4 list. Return to the ER for new or worsening symptoms.

## 2015-08-12 NOTE — ED Notes (Signed)
Gave Pt apple juice per [place] PA

## 2015-08-12 NOTE — ED Provider Notes (Signed)
[facility] Provider Note   CSN: [id number] Arrival date & time: 08/12/15  1209  First Provider Contact:  First MD Initiated Contact with Patient 08/12/15 1350     History   Chief Complaint Chief Complaint  Patient presents with  . Emesis  . Diarrhea   HPI  Krista Stout is an 25 y.o. female G2P1 currently [redacted] weeks pregnant who presents to the ED for evaluation of nausea and vomiting. She states she has not been able to tolerate anything PO for the past five days. She states she has been throwing up everything and even certain tastes or smells make her nauseated. Denies diarrhea. Denies abdominal pain. Denies vaginal bleeding. She denies feeling faint or lightheaded. She recently moved to the area from [place] and has not established OB care locally yet.  Past Medical History:  Diagnosis Date  . Asthma   . Bacterial vaginitis     There are no active problems to display for this patient.   Past Surgical History:  Procedure Laterality Date  . HERNIA REPAIR      OB History    Gravida Para Term Preterm AB Living   1             SAB TAB Ectopic Multiple Live Births                   Home Medications    Prior to Admission medications   Not on File    Family History No family history on file.  Social History Social History  Substance Use Topics  . Smoking status: Former Games developer  . Smokeless tobacco: Never Used  . Alcohol use No     Allergies   Review of patient's allergies indicates no known allergies.   Review of Systems Review of Systems 10 Systems reviewed and are negative for acute change except as noted in the HPI.   Physical Exam Updated Vital Signs BP 120/66   Pulse 89   Temp 98.3 F (36.8 C) (Oral)   Resp 18   LMP 06/27/2015   SpO2 100%   Physical Exam  Constitutional: She is oriented to person, place, and time.  HENT:  Right Ear: External ear normal.  Left Ear: External ear normal.  Nose: Nose normal.  Mouth/Throat:  Oropharynx is clear and moist. No oropharyngeal exudate.  Eyes: Conjunctivae are normal.  Neck: Neck supple.  Cardiovascular: Normal rate, regular rhythm, normal heart sounds and intact distal pulses.   Pulmonary/Chest: Effort normal and breath sounds normal. No respiratory distress. She has no wheezes.  Abdominal: Soft. Bowel sounds are normal. She exhibits no distension. There is no tenderness. There is no rebound and no guarding.  Musculoskeletal: She exhibits no edema.  Neurological: She is alert and oriented to person, place, and time. No cranial nerve deficit.  Skin: Skin is warm and dry.  Psychiatric: She has a normal mood and affect.  Nursing note and vitals reviewed.    ED Treatments / Results  Labs (all labs ordered are listed, but only abnormal results are displayed) Labs Reviewed  COMPREHENSIVE METABOLIC PANEL - Abnormal; Notable for the following:       Result Value   Potassium 3.1 (*)    CO2 21 (*)    All other components within normal limits  CBC - Abnormal; Notable for the following:    Hemoglobin 16.3 (*)    HCT 46.2 (*)    All other components within normal limits  URINALYSIS, ROUTINE W REFLEX MICROSCOPIC (  NOT AT [facility]) - Abnormal; Notable for the following:    Color, Urine AMBER (*)    APPearance TURBID (*)    Specific Gravity, Urine 1.037 (*)    Bilirubin Urine SMALL (*)    Ketones, ur >80 (*)    Protein, ur 30 (*)    All other components within normal limits  HCG, QUANTITATIVE, PREGNANCY - Abnormal; Notable for the following:    hCG, Beta Chain, Quant, S 88,891 (*)    All other components within normal limits  URINE MICROSCOPIC-ADD ON - Abnormal; Notable for the following:    Squamous Epithelial / LPF TOO NUMEROUS TO COUNT (*)    Bacteria, UA MANY (*)    All other components within normal limits  I-STAT BETA HCG BLOOD, ED ([facility], [facility], [facility] ONLY) - Abnormal; Notable for the following:    I-stat hCG, quantitative >2,000.0 (*)    All other components within  normal limits  CBG MONITORING, ED - Abnormal; Notable for the following:    Glucose-Capillary 106 (*)    All other components within normal limits  LIPASE, BLOOD    EKG  EKG Interpretation None       Radiology No results found.  Procedures Procedures (including critical care time)  Medications Ordered in ED Medications  sodium chloride 0.9 % bolus 1,000 mL (0 mLs Intravenous Stopped 08/12/15 1522)  ondansetron (ZOFRAN) injection 4 mg (4 mg Intravenous Given 08/12/15 1420)  potassium chloride SA (K-DUR,KLOR-CON) CR tablet 40 mEq (40 mEq Oral Given 08/12/15 1420)     Initial Impression / Assessment and Plan / ED Course  I have reviewed the triage vital signs and the nursing notes.  Pertinent labs & imaging results that were available during my care of the patient were reviewed by me and considered in my medical decision making (see chart for details).  Clinical Course   Potassium repleted in the ED. Pt feels improved after anti-emetics and is tolerating PO. She is not having any abdominal pain or tenderness and no vaginal bleeding. No indication for further OB imaging. I gave prescriptions for either Zofran or Reglan depending which she can afford. Also instructed [facility] care. ER return precautions given.  Final Clinical Impressions(s) / ED Diagnoses   Final diagnoses:  Hyperemesis gravidarum    New Prescriptions Discharge Medication List as of 08/12/2015  3:22 PM    START taking these medications   Details  metoCLOPramide (REGLAN) 10 MG tablet Take 1 tablet (10 mg total) by mouth every 6 (six) hours as needed for nausea or vomiting., Starting Sat 08/12/2015, Print    ondansetron (ZOFRAN ODT) 4 MG disintegrating tablet Take 1 tablet (4 mg total) by mouth every 8 (eight) hours as needed for nausea or vomiting., Starting Sat 08/12/2015, Print         Carlene Coria, [place] 08/13/15 6945    Pricilla Loveless, MD 08/17/15  [phone]

## 2016-02-03 ENCOUNTER — Encounter (HOSPITAL_COMMUNITY): Payer: Self-pay | Admitting: Emergency Medicine

## 2016-02-03 ENCOUNTER — Emergency Department (HOSPITAL_COMMUNITY)
Admission: EM | Admit: 2016-02-03 | Discharge: 2016-02-03 | Disposition: A | Discharge status: Home / Self Care | Payer: Medicaid - Out of State | Attending: Emergency Medicine | Admitting: Emergency Medicine

## 2016-02-03 DIAGNOSIS — J45909 Unspecified asthma, uncomplicated: Secondary | ICD-10-CM | POA: Diagnosis not present

## 2016-02-03 DIAGNOSIS — Z3490 Encounter for supervision of normal pregnancy, unspecified, unspecified trimester: Secondary | ICD-10-CM

## 2016-02-03 DIAGNOSIS — Z87891 Personal history of nicotine dependence: Secondary | ICD-10-CM | POA: Diagnosis not present

## 2016-02-03 DIAGNOSIS — B9689 Other specified bacterial agents as the cause of diseases classified elsewhere: Secondary | ICD-10-CM | POA: Insufficient documentation

## 2016-02-03 DIAGNOSIS — N760 Acute vaginitis: Secondary | ICD-10-CM | POA: Insufficient documentation

## 2016-02-03 DIAGNOSIS — R112 Nausea with vomiting, unspecified: Secondary | ICD-10-CM | POA: Diagnosis not present

## 2016-02-03 DIAGNOSIS — Z331 Pregnant state, incidental: Secondary | ICD-10-CM | POA: Insufficient documentation

## 2016-02-03 DIAGNOSIS — N898 Other specified noninflammatory disorders of vagina: Secondary | ICD-10-CM | POA: Diagnosis present

## 2016-02-03 LAB — CBC
HCT: 38.8 % (ref 36.0–46.0)
Hemoglobin: 13.2 g/dL (ref 12.0–15.0)
MCH: 31.4 pg (ref 26.0–34.0)
MCHC: 34 g/dL (ref 30.0–36.0)
MCV: 92.4 fL (ref 78.0–100.0)
PLATELETS: 227 10*3/uL (ref 150–400)
RBC: 4.2 MIL/uL (ref 3.87–5.11)
RDW: 13.4 % (ref 11.5–15.5)
WBC: 4.4 10*3/uL (ref 4.0–10.5)

## 2016-02-03 LAB — URINALYSIS, ROUTINE W REFLEX MICROSCOPIC
Bilirubin Urine: NEGATIVE
Glucose, UA: NEGATIVE mg/dL
Hgb urine dipstick: NEGATIVE
Ketones, ur: NEGATIVE mg/dL
LEUKOCYTES UA: NEGATIVE
Nitrite: NEGATIVE
PH: 6 (ref 5.0–8.0)
Protein, ur: NEGATIVE mg/dL
Specific Gravity, Urine: 1.006 (ref 1.005–1.030)

## 2016-02-03 LAB — WET PREP, GENITAL
Sperm: NONE SEEN
Trich, Wet Prep: NONE SEEN

## 2016-02-03 LAB — COMPREHENSIVE METABOLIC PANEL
ALT: 18 U/L (ref 14–54)
AST: 19 U/L (ref 15–41)
Albumin: 3.8 g/dL (ref 3.5–5.0)
Alkaline Phosphatase: 46 U/L (ref 38–126)
Anion gap: 7 (ref 5–15)
CHLORIDE: 104 mmol/L (ref 101–111)
CO2: 23 mmol/L (ref 22–32)
CREATININE: 0.82 mg/dL (ref 0.44–1.00)
Calcium: 9.3 mg/dL (ref 8.9–10.3)
GFR calc Af Amer: >60 mL/min (ref >60)
Glucose, Bld: 102 mg/dL — ABNORMAL HIGH (ref 65–99)
Potassium: 3.9 mmol/L (ref 3.5–5.1)
Sodium: 134 mmol/L — ABNORMAL LOW (ref 135–145)
Total Bilirubin: 0.6 mg/dL (ref 0.3–1.2)
Total Protein: 6.5 g/dL (ref 6.5–8.1)

## 2016-02-03 LAB — I-STAT BETA HCG BLOOD, ED (MC, WL, AP ONLY)

## 2016-02-03 LAB — LIPASE, BLOOD: LIPASE: 20 U/L (ref 11–51)

## 2016-02-03 MED ORDER — SODIUM CHLORIDE 0.9 % IV BOLUS (SEPSIS)
1000.0000 mL | Freq: Once | INTRAVENOUS | Status: AC
  Administered 2016-02-03: 1000 mL via INTRAVENOUS

## 2016-02-03 MED ORDER — FLUCONAZOLE 150 MG PO TABS: 150.0000 mg | ORAL_TABLET | Freq: Once | ORAL | 0 refills | Status: AC

## 2016-02-03 MED ORDER — CEFTRIAXONE SODIUM 250 MG IJ SOLR
250.0000 mg | Freq: Once | INTRAMUSCULAR | Status: AC
  Administered 2016-02-03: 250 mg via INTRAMUSCULAR
  Filled 2016-02-03: qty 250

## 2016-02-03 MED ORDER — PRENATAL VITAMINS 28-0.8 MG PO TABS: 1.0000 | ORAL_TABLET | ORAL | 0 refills | Status: DC

## 2016-02-03 MED ORDER — LIDOCAINE HCL (PF) 1 % IJ SOLN
INTRAMUSCULAR | Status: AC
  Filled 2016-02-03: qty 5

## 2016-02-03 MED ORDER — ONDANSETRON HCL 4 MG/2ML IJ SOLN
4.0000 mg | Freq: Once | INTRAMUSCULAR | Status: AC
  Administered 2016-02-03: 4 mg via INTRAVENOUS
  Filled 2016-02-03: qty 2

## 2016-02-03 MED ORDER — AZITHROMYCIN 250 MG PO TABS
1000.0000 mg | ORAL_TABLET | Freq: Once | ORAL | Status: AC
  Administered 2016-02-03: 1000 mg via ORAL
  Filled 2016-02-03: qty 4

## 2016-02-03 MED ORDER — METRONIDAZOLE 0.75 % VA GEL: 1.0000 | Freq: Two times a day (BID) | VAGINAL | 0 refills | Status: DC

## 2016-02-03 NOTE — ED Provider Notes (Signed)
[facility] Provider Note   CSN: [id number] Arrival date & time: 02/03/16  1104     History   Chief Complaint Chief Complaint  Patient presents with  . Emesis  . Vaginal Discharge    HPI Krista Stout is a 26 y.o. female.  Krista Stout is a 26 y.o. Female who presents to the ED complaining of nausea and vomiting for the past week as well as a vaginal discharge for the past 2 days. Patient reports she's been having nausea and vomiting over the past week and has been vomiting up everything she eats and drinks. She denies any abdominal pain. She denies any symptoms of acid reflux. She is a previous abdominal surgery of umbilical hernia repair several years ago. She's been passing gas. No bowel movement in 48 hours. She tells she's not been eating anything this is not surprising to her. Receiving some Zofran earlier in the week that helps some but has not been helping recently. She reports having some dysuria last week but this has resolved. She is sexually active. She denies fevers, abdominal pain, hematemesis, diarrhea, urinary frequency, urinary urgency, cough, chest pain, shortness of breath or vaginal bleeding.   The history is provided by the patient. No language interpreter was used.  Emesis   Pertinent negatives include no abdominal pain, no chills, no cough, no diarrhea, no fever and no headaches.  Vaginal Discharge   Associated symptoms include nausea and vomiting. Pertinent negatives include no fever, no abdominal pain, no diarrhea, no dysuria and no frequency.    Past Medical History:  Diagnosis Date  . Asthma   . Bacterial vaginitis     There are no active problems to display for this patient.   Past Surgical History:  Procedure Laterality Date  . HERNIA REPAIR      OB History    Gravida Para Term Preterm AB Living   1             SAB TAB Ectopic Multiple Live Births                   Home Medications    Prior to Admission medications     Medication Sig Start Date End Date Taking? Authorizing Provider  ondansetron (ZOFRAN ODT) 4 MG disintegrating tablet Take 1 tablet (4 mg total) by mouth every 8 (eight) hours as needed for nausea or vomiting. 08/12/15  Yes Ace Gins Sam, PA-C  fluconazole (DIFLUCAN) 150 MG tablet Take 1 tablet (150 mg total) by mouth once. 02/03/16 02/03/16  Everlene Farrier, PA-C  metoCLOPramide (REGLAN) 10 MG tablet Take 1 tablet (10 mg total) by mouth every 6 (six) hours as needed for nausea or vomiting. Patient not taking: Reported on 02/03/2016 08/12/15   Ace Gins Sam, PA-C  metroNIDAZOLE (METROGEL VAGINAL) 0.75 % vaginal gel Place 1 Applicatorful vaginally 2 (two) times daily. 02/03/16   Everlene Farrier, PA-C  Prenatal Vit-Fe Fumarate-FA (PRENATAL VITAMINS) 28-0.8 MG TABS Take 1 tablet by mouth as directed. 02/03/16   Everlene Farrier, PA-C    Family History No family history on file.  Social History Social History  Substance Use Topics  . Smoking status: Former Games developer  . Smokeless tobacco: Never Used  . Alcohol use No     Allergies   Patient has no known allergies.   Review of Systems Review of Systems  Constitutional: Negative for chills and fever.  HENT: Negative for congestion and sore throat.   Eyes: Negative for visual disturbance.  Respiratory: Negative for cough, shortness of breath and wheezing.   Cardiovascular: Negative for chest pain and palpitations.  Gastrointestinal: Positive for nausea and vomiting. Negative for abdominal pain, blood in stool and diarrhea.  Genitourinary: Positive for vaginal discharge. Negative for difficulty urinating, dysuria, frequency, hematuria and vaginal bleeding.  Musculoskeletal: Negative for back pain and neck pain.  Skin: Negative for rash.  Neurological: Negative for headaches.     Physical Exam Updated Vital Signs BP (!) 100/45 (BP Location: Right Arm)   Pulse 67   Temp 98.6 F (37 C) (Oral)   Resp 12   Ht 5\' 2"  (1.575 m)   Wt 59.2 kg   LMP  01/01/2016   SpO2 99%   BMI 23.88 kg/m   Physical Exam  Constitutional: She appears well-developed and well-nourished. No distress.  Nontoxic appearing.  HENT:  Head: Normocephalic and atraumatic.  Mouth/Throat: Oropharynx is clear and moist.  Mucous membranes are moist.  Eyes: Conjunctivae are normal. Pupils are equal, round, and reactive to light. Right eye exhibits no discharge. Left eye exhibits no discharge.  Neck: Neck supple.  Cardiovascular: Normal rate, regular rhythm, normal heart sounds and intact distal pulses.  Exam reveals no gallop and no friction rub.   No murmur heard. Pulmonary/Chest: Effort normal and breath sounds normal. No respiratory distress. She has no wheezes. She has no rales.  Abdominal: Soft. Bowel sounds are normal. She exhibits no distension and no mass. There is no tenderness. There is no rebound and no guarding.  Abdomen is soft and nontender to palpation. Bowel sounds are present. No CVA or flank tenderness. No peritoneal signs. No psoas or obturator sign.  Genitourinary: Vaginal discharge found.  Genitourinary Comments: Pelvic exam with female nurse tech as chaperone. Moderate amount of malodorous vaginal discharge. No vaginal bleeding. No cervical motion tenderness. No adnexal tenderness or fullness.  Musculoskeletal: She exhibits no edema.  Lymphadenopathy:    She has no cervical adenopathy.  Neurological: She is alert. Coordination normal.  Skin: Skin is warm and dry. Capillary refill takes less than 2 seconds. No rash noted. She is not diaphoretic. No erythema. No pallor.  Psychiatric: She has a normal mood and affect. Her behavior is normal.  Nursing note and vitals reviewed.    ED Treatments / Results  Labs (all labs ordered are listed, but only abnormal results are displayed) Labs Reviewed  WET PREP, GENITAL - Abnormal; Notable for the following:       Result Value   Yeast Wet Prep HPF POC PRESENT (*)    Clue Cells Wet Prep HPF POC  PRESENT (*)    WBC, Wet Prep HPF POC MODERATE (*)    All other components within normal limits  COMPREHENSIVE METABOLIC PANEL - Abnormal; Notable for the following:    Sodium 134 (*)    Glucose, Bld 102 (*)    BUN <5 (*)    All other components within normal limits  I-STAT BETA HCG BLOOD, ED ([facility], [facility], [facility] ONLY) - Abnormal; Notable for the following:    I-stat hCG, quantitative >2,000.0 (*)    All other components within normal limits  LIPASE, BLOOD  CBC  URINALYSIS, ROUTINE W REFLEX MICROSCOPIC  RPR  HIV ANTIBODY (ROUTINE TESTING)  GC/CHLAMYDIA PROBE AMP ([place]) NOT AT [facility]    EKG  EKG Interpretation None       Radiology No results found.  Procedures Procedures (including critical care time)  Medications Ordered in ED Medications  cefTRIAXone (ROCEPHIN) injection 250 mg (  not administered)  azithromycin (ZITHROMAX) tablet 1,000 mg (not administered)  sodium chloride 0.9 % bolus 1,000 mL (1,000 mLs Intravenous New Bag/Given 02/03/16 1438)  ondansetron (ZOFRAN) injection 4 mg (4 mg Intravenous Given 02/03/16 1438)     Initial Impression / Assessment and Plan / ED Course  I have reviewed the triage vital signs and the nursing notes.  Pertinent labs & imaging results that were available during my care of the patient were reviewed by me and considered in my medical decision making (see chart for details).   This  is a 26 y.o. Female who presents to the ED complaining of nausea and vomiting for the past week as well as a vaginal discharge for the past 2 days. Patient reports she's been having nausea and vomiting over the past week and has been vomiting up everything she eats and drinks. She denies any abdominal pain. On exam the patient is afebrile nontoxic appearing. Her mucous membranes are moist. Her abdomen is soft and nontender to palpation. On pelvic exam she has a moderate amount of white vaginal discharge. No tenderness. No cervical motion tenderness. No vaginal  bleeding. CMP and CBC are unremarkable. Lipase is within normal limits. Pregnancy test is positive. Wet prep shows yeast, clue cells and white blood cells. I discussed these results with the patient. She would like to be treated for gonorrhea and chlamydia here today. We'll also send her home with MetroGel and Diflucan. Prenatal vitamins for pregnancy. She's been pregnant previously. I encouraged follow-up by women's outpatient clinic. She has no abdominal pain. No need for ectopic pregnancy workup. Patient reports nausea has resolved and she is tolerating fluids and a sandwich without nausea or vomiting here before discharge. I advised the patient to follow-up with their primary care provider this week. I advised the patient to return to the emergency department with new or worsening symptoms or new concerns. The patient verbalized understanding and agreement with plan.    This patient was discussed with Dr. Patria Mane who agrees with assessment and plan.   Final Clinical Impressions(s) / ED Diagnoses   Final diagnoses:  Pregnancy, unspecified gestational age  BV (bacterial vaginosis)  Non-intractable vomiting with nausea, unspecified vomiting type    New Prescriptions New Prescriptions   FLUCONAZOLE (DIFLUCAN) 150 MG TABLET    Take 1 tablet (150 mg total) by mouth once.   METRONIDAZOLE (METROGEL VAGINAL) 0.75 % VAGINAL GEL    Place 1 Applicatorful vaginally 2 (two) times daily.   PRENATAL VIT-FE FUMARATE-FA (PRENATAL VITAMINS) 28-0.8 MG TABS    Take 1 tablet by mouth as directed.     Everlene Farrier, PA-C 02/03/16 1638    Azalia Bilis, MD 02/03/16 [phone]

## 2016-02-03 NOTE — ED Triage Notes (Signed)
Pt c/o N/V x 1 week and white vaginal discharge x 2 days.

## 2016-02-04 LAB — HIV ANTIBODY (ROUTINE TESTING W REFLEX): HIV Screen 4th Generation wRfx: NONREACTIVE

## 2016-02-04 LAB — RPR: RPR: NONREACTIVE

## 2016-02-05 LAB — GC/CHLAMYDIA PROBE AMP (~~LOC~~) NOT AT ARMC
CHLAMYDIA, DNA PROBE: NEGATIVE
Neisseria Gonorrhea: NEGATIVE

## 2016-06-22 ENCOUNTER — Encounter (HOSPITAL_COMMUNITY): Payer: Self-pay | Admitting: Emergency Medicine

## 2016-06-22 ENCOUNTER — Emergency Department (HOSPITAL_COMMUNITY)
Admission: EM | Admit: 2016-06-22 | Discharge: 2016-06-22 | Disposition: A | Discharge status: Home / Self Care | Payer: PRIVATE HEALTH INSURANCE | Attending: Emergency Medicine | Admitting: Emergency Medicine

## 2016-06-22 DIAGNOSIS — B9689 Other specified bacterial agents as the cause of diseases classified elsewhere: Secondary | ICD-10-CM | POA: Insufficient documentation

## 2016-06-22 DIAGNOSIS — N760 Acute vaginitis: Secondary | ICD-10-CM | POA: Insufficient documentation

## 2016-06-22 DIAGNOSIS — J45909 Unspecified asthma, uncomplicated: Secondary | ICD-10-CM | POA: Insufficient documentation

## 2016-06-22 DIAGNOSIS — R350 Frequency of micturition: Secondary | ICD-10-CM | POA: Insufficient documentation

## 2016-06-22 DIAGNOSIS — Z87891 Personal history of nicotine dependence: Secondary | ICD-10-CM | POA: Insufficient documentation

## 2016-06-22 DIAGNOSIS — R1030 Lower abdominal pain, unspecified: Secondary | ICD-10-CM | POA: Insufficient documentation

## 2016-06-22 LAB — URINALYSIS, ROUTINE W REFLEX MICROSCOPIC
BACTERIA UA: NONE SEEN
Bilirubin Urine: NEGATIVE
GLUCOSE, UA: NEGATIVE mg/dL
Hgb urine dipstick: NEGATIVE
KETONES UR: NEGATIVE mg/dL
Nitrite: NEGATIVE
PROTEIN: NEGATIVE mg/dL
Specific Gravity, Urine: 1.026 (ref 1.005–1.030)
pH: 5 (ref 5.0–8.0)

## 2016-06-22 LAB — WET PREP, GENITAL
Sperm: NONE SEEN
TRICH WET PREP: NONE SEEN
Yeast Wet Prep HPF POC: NONE SEEN

## 2016-06-22 LAB — PREGNANCY, URINE: Preg Test, Ur: NEGATIVE

## 2016-06-22 LAB — RAPID HIV SCREEN (HIV 1/2 AB+AG)
HIV 1/2 ANTIBODIES: NONREACTIVE
HIV-1 P24 Antigen - HIV24: NONREACTIVE

## 2016-06-22 MED ORDER — CEFTRIAXONE SODIUM 250 MG IJ SOLR
250.0000 mg | Freq: Once | INTRAMUSCULAR | Status: AC
  Administered 2016-06-22: 250 mg via INTRAMUSCULAR
  Filled 2016-06-22: qty 250

## 2016-06-22 MED ORDER — LIDOCAINE HCL (PF) 1 % IJ SOLN
INTRAMUSCULAR | Status: AC
  Administered 2016-06-22: 0.9 mL
  Filled 2016-06-22: qty 5

## 2016-06-22 MED ORDER — METRONIDAZOLE 500 MG PO TABS: 500.0000 mg | ORAL_TABLET | Freq: Two times a day (BID) | ORAL | 0 refills | Status: DC

## 2016-06-22 MED ORDER — AZITHROMYCIN 250 MG PO TABS
1000.0000 mg | ORAL_TABLET | Freq: Once | ORAL | Status: AC
  Administered 2016-06-22: 1000 mg via ORAL
  Filled 2016-06-22: qty 4

## 2016-06-22 NOTE — ED Notes (Signed)
Pt home stable with significant other. With steady gait.

## 2016-06-22 NOTE — ED Provider Notes (Signed)
[facility]-EMERGENCY DEPT Provider Note   CSN: [id number] Arrival date & time: 06/22/16  0751     History   Chief Complaint Chief Complaint  Patient presents with  . Urinary Tract Infection    HPI Krista Stout is a 26 y.o. female w/ a h/o of UTI and BV who presents to the emergency department with a chief complaint of urinary frequency and hesitancy and suprapubic abdominal pain that began 2 days ago. She also reports that she has noted some vaginal discharge with slight odor within the last few weeks. She denies fever, chills, dysuria, hematuria, vaginal itching, nausea, vomiting, diarrhea, or constipation. She reports that she is currently in a monogamous relationship with a female sexual partner. She does not use birth control. She is unsure of her LMP, but believes it was sometime within the last month.  She has recently moved to the area from [place] and does not have a PCP or OBGYN locally. No chronic health problems. NKA.    The history is provided by the patient. No language interpreter was used.    Past Medical History:  Diagnosis Date  . Asthma   . Bacterial vaginitis     There are no active problems to display for this patient.   Past Surgical History:  Procedure Laterality Date  . HERNIA REPAIR      OB History    Gravida Para Term Preterm AB Living   1             SAB TAB Ectopic Multiple Live Births                   Home Medications    Prior to Admission medications   Medication Sig Start Date End Date Taking? Authorizing Provider  metoCLOPramide (REGLAN) 10 MG tablet Take 1 tablet (10 mg total) by mouth every 6 (six) hours as needed for nausea or vomiting. Patient not taking: Reported on 02/03/2016 08/12/15   Sam, Ace Gins, PA-C  metroNIDAZOLE (FLAGYL) 500 MG tablet Take 1 tablet (500 mg total) by mouth 2 (two) times daily. 06/22/16   Mozel Burdett A, PA-C  metroNIDAZOLE (METROGEL VAGINAL) 0.75 % vaginal gel Place 1 Applicatorful vaginally 2 (two) times  daily. Patient not taking: Reported on 06/22/2016 02/03/16   Everlene Farrier, PA-C  ondansetron (ZOFRAN ODT) 4 MG disintegrating tablet Take 1 tablet (4 mg total) by mouth every 8 (eight) hours as needed for nausea or vomiting. Patient not taking: Reported on 06/22/2016 08/12/15   Carlene Coria, PA-C  Prenatal Vit-Fe Fumarate-FA (PRENATAL VITAMINS) 28-0.8 MG TABS Take 1 tablet by mouth as directed. Patient not taking: Reported on 06/22/2016 02/03/16   Everlene Farrier, PA-C    Family History No family history on file.  Social History Social History  Substance Use Topics  . Smoking status: Former Games developer  . Smokeless tobacco: Never Used  . Alcohol use No     Allergies   Patient has no known allergies.   Review of Systems Review of Systems  Constitutional: Negative for activity change, chills and fever.  Respiratory: Negative for shortness of breath.   Cardiovascular: Negative for chest pain.  Gastrointestinal: Positive for abdominal pain. Negative for constipation, diarrhea, nausea and vomiting.  Genitourinary: Positive for frequency and vaginal discharge. Negative for hematuria.       Urinary hesitancy   Musculoskeletal: Negative for back pain.  Skin: Negative for rash.    Physical Exam Updated Vital Signs BP 96/64 (BP Location: Right Arm)  Pulse 60   Temp 98.1 F (36.7 C) (Oral) Comment: Pt eating food and tolerating well.  Resp 16   LMP 06/05/2016   SpO2 99%   Breastfeeding? Unknown   Physical Exam  Constitutional: No distress.  HENT:  Head: Normocephalic.  Eyes: Conjunctivae are normal.  Neck: Neck supple.  Cardiovascular: Normal rate and regular rhythm.  Exam reveals no gallop and no friction rub.   No murmur heard. Pulmonary/Chest: Effort normal. No respiratory distress.  Abdominal: Soft. Bowel sounds are normal. She exhibits no distension. There is tenderness.  Suprapubic tenderness  Genitourinary: Uterus normal. There is no rash or lesion on the right labia.  There is no rash or lesion on the left labia. Cervix exhibits no motion tenderness. Right adnexum displays no tenderness and no fullness. Left adnexum displays no tenderness and no fullness.  Genitourinary Comments: The patient has a piercing in place no signs of surrounding infection. No cervical motion tenderness. Scant white, malordorous discharge is noted in the vaginal vault.  Neurological: She is alert.  Skin: Skin is warm. No rash noted.  Psychiatric: Her behavior is normal.  Nursing note and vitals reviewed.    ED Treatments / Results  Labs (all labs ordered are listed, but only abnormal results are displayed) Labs Reviewed  WET PREP, GENITAL - Abnormal; Notable for the following:       Result Value   Clue Cells Wet Prep HPF POC PRESENT (*)    WBC, Wet Prep HPF POC MODERATE (*)    All other components within normal limits  URINALYSIS, ROUTINE W REFLEX MICROSCOPIC - Abnormal; Notable for the following:    APPearance HAZY (*)    Leukocytes, UA LARGE (*)    Squamous Epithelial / LPF 0-5 (*)    All other components within normal limits  URINE CULTURE  RAPID HIV SCREEN (HIV 1/2 AB+AG)  PREGNANCY, URINE  GC/CHLAMYDIA PROBE AMP ([place]) NOT AT [facility]    EKG  EKG Interpretation None       Radiology No results found.  Procedures Procedures (including critical care time)  Medications Ordered in ED Medications  azithromycin (ZITHROMAX) tablet 1,000 mg (1,000 mg Oral Given 06/22/16 1051)  cefTRIAXone (ROCEPHIN) injection 250 mg (250 mg Intramuscular Given 06/22/16 1054)  lidocaine (PF) (XYLOCAINE) 1 % injection (0.9 mLs  Given 06/22/16 1053)     Initial Impression / Assessment and Plan / ED Course  I have reviewed the triage vital signs and the nursing notes.  Pertinent labs & imaging results that were available during my care of the patient were reviewed by me and considered in my medical decision making (see chart for details).     Patient to be discharged with  instructions to follow up with OBGYN. Discussed importance of using protection when sexually active. Pt understands that they have GC/Chlamydia cultures pending and that they will need to inform all sexual partners if results return positive. Pt has been treated prophylacticly with azithromycin and rocephin due to pts history, pelvic exam, and wet prep with increased WBCs. Pt not concerning for PID because hemodynamically stable and no cervical motion tenderness on pelvic exam. Pt has also been given prescription for flagyl for Bacterial Vaginosis. Pt has been advised to not drink alcohol while on this medication.   Final Clinical Impressions(s) / ED Diagnoses   Final diagnoses:  Bacterial vaginosis    New Prescriptions Discharge Medication List as of 06/22/2016 10:33 AM    START taking these medications   Details  metroNIDAZOLE (FLAGYL) 500 MG tablet Take 1 tablet (500 mg total) by mouth 2 (two) times daily., Starting Sat 06/22/2016, Print         Marge Vandermeulen A, PA-C 06/22/16 2310    Marily Memos, MD 06/23/16 [phone]

## 2016-06-22 NOTE — Discharge Instructions (Signed)
You have been treated in the emergency department today for gonorrhea and chlamydia. Results are still pending in the lab, and someone will call you if they are positive; however you very been treated. You have been given a prescription for metronidazole to treat bacterial vaginosis. Please take this medication twice a day for the next 7 days. Please avoid alcohol use taking this medication because it can cause vomiting. You can call the number on her discharge paperwork to get established with a primary care provider.

## 2016-06-22 NOTE — ED Triage Notes (Signed)
Pt reports having the feeling of a full bladder, frequent urination and burning with urination x 2 days.

## 2016-06-22 NOTE — ED Notes (Signed)
Pt c/o frequency and feeling of fulness after bladder emptying.

## 2016-06-24 LAB — URINE CULTURE: Culture: 80000 — AB

## 2016-06-24 LAB — GC/CHLAMYDIA PROBE AMP (~~LOC~~) NOT AT ARMC
Chlamydia: NEGATIVE
Neisseria Gonorrhea: NEGATIVE

## 2016-06-25 ENCOUNTER — Telehealth: Payer: Self-pay | Admitting: Emergency Medicine

## 2016-06-25 NOTE — Progress Notes (Signed)
ED Antimicrobial Stewardship Positive Culture Follow Up   Krista Stout is an 26 y.o. female who [facility] with a chief complaint of  Chief Complaint  Patient presents with  . Urinary Tract Infection    Recent Results (from the past 720 hour(s))  Urine culture     Status: Abnormal   Collection Time: 06/22/16  8:23 AM  Result Value Ref Range Status   Specimen Description URINE, CLEAN CATCH  Final   Special Requests NONE  Final   Culture 80,000 COLONIES/mL ESCHERICHIA COLI (A)  Final   Report Status 06/24/2016 FINAL  Final   Organism ID, Bacteria ESCHERICHIA COLI (A)  Final      Susceptibility   Escherichia coli - MIC*    AMPICILLIN <=2 SENSITIVE Sensitive     CEFAZOLIN <=4 SENSITIVE Sensitive     CEFTRIAXONE <=1 SENSITIVE Sensitive     CIPROFLOXACIN <=0.25 SENSITIVE Sensitive     GENTAMICIN <=1 SENSITIVE Sensitive     IMIPENEM <=0.25 SENSITIVE Sensitive     NITROFURANTOIN <=16 SENSITIVE Sensitive     TRIMETH/SULFA <=20 SENSITIVE Sensitive     AMPICILLIN/SULBACTAM <=2 SENSITIVE Sensitive     PIP/TAZO <=4 SENSITIVE Sensitive     Extended ESBL NEGATIVE Sensitive     * 80,000 COLONIES/mL ESCHERICHIA COLI  Wet prep, genital     Status: Abnormal   Collection Time: 06/22/16  9:33 AM  Result Value Ref Range Status   Yeast Wet Prep HPF POC NONE SEEN NONE SEEN Final   Trich, Wet Prep NONE SEEN NONE SEEN Final   Clue Cells Wet Prep HPF POC PRESENT (A) NONE SEEN Final   WBC, Wet Prep HPF POC MODERATE (A) NONE SEEN Final    Comment: Swab received with less than 0.5 mL of saline, saline added to specimen, interpret results with caution.   Sperm NONE SEEN  Final    [x]  Patient discharged originally without antimicrobial agent and treatment is now indicated  New antibiotic prescription: Bactrim DS 1 tab bid x 3 days  ED Provider: Teressa LowerVrinda Pickering, NP   Rolley SimsMartin, Elam Ellis Ann 06/25/2016, 9:46 AM Infectious Diseases Pharmacist Phone# [phone]

## 2016-06-25 NOTE — Telephone Encounter (Signed)
Post ED Visit - Positive Culture Follow-up: Successful Patient Follow-Up  Culture assessed and recommendations reviewed by: []  Enzo BiNathan Batchelder, Pharm.D. []  Celedonio MiyamotoJeremy Frens, Pharm.D., BCPS AQ-ID []  Garvin FilaMike Maccia, Pharm.D., BCPS [x]  Georgina PillionElizabeth Martin, Pharm.D., BCPS []  BurnsvilleMinh Pham, [address] BoulevardPharm.D., BCPS, AAHIVP []  Estella HuskMichelle Turner, Pharm.D., BCPS, AAHIVP []  Lysle Pearlachel Rumbarger, PharmD, BCPS []  Casilda Carlsaylor Stone, PharmD, BCPS []  Pollyann SamplesAndy Johnston, PharmD, BCPS  Positive urine culture  [x]  Patient discharged without antimicrobial prescription and treatment is now indicated []  Organism is resistant to prescribed ED discharge antimicrobial []  Patient with positive blood cultures  Changes discussed with ED provider: Teressa LowerVrinda Pickering PA New antibiotic prescription continue flagyl, start Bactrim DS 1 tab po bid x 3 days [facility] [place]  Contacted patient, 06/25/2016 1012   [name], Krista Durnell 06/25/2016, 10:11 AM

## 2017-05-12 ENCOUNTER — Encounter (HOSPITAL_COMMUNITY): Payer: Self-pay | Admitting: Emergency Medicine

## 2017-05-12 ENCOUNTER — Other Ambulatory Visit: Payer: Self-pay

## 2017-05-12 ENCOUNTER — Emergency Department (HOSPITAL_COMMUNITY)
Admission: EM | Admit: 2017-05-12 | Discharge: 2017-05-12 | Disposition: A | Discharge status: Home / Self Care | Payer: Medicaid - Out of State | Attending: Emergency Medicine | Admitting: Emergency Medicine

## 2017-05-12 DIAGNOSIS — B373 Candidiasis of vulva and vagina: Secondary | ICD-10-CM | POA: Insufficient documentation

## 2017-05-12 DIAGNOSIS — J45909 Unspecified asthma, uncomplicated: Secondary | ICD-10-CM | POA: Insufficient documentation

## 2017-05-12 DIAGNOSIS — N898 Other specified noninflammatory disorders of vagina: Secondary | ICD-10-CM | POA: Diagnosis present

## 2017-05-12 DIAGNOSIS — B3731 Acute candidiasis of vulva and vagina: Secondary | ICD-10-CM

## 2017-05-12 DIAGNOSIS — Z87891 Personal history of nicotine dependence: Secondary | ICD-10-CM | POA: Insufficient documentation

## 2017-05-12 LAB — WET PREP, GENITAL
Clue Cells Wet Prep HPF POC: NONE SEEN
Sperm: NONE SEEN
Trich, Wet Prep: NONE SEEN

## 2017-05-12 LAB — I-STAT BETA HCG BLOOD, ED (MC, WL, AP ONLY): I-stat hCG, quantitative: <5 m[IU]/mL (ref <5)

## 2017-05-12 MED ORDER — FLUCONAZOLE 150 MG PO TABS
150.0000 mg | ORAL_TABLET | Freq: Once | ORAL | Status: AC
  Administered 2017-05-12: 150 mg via ORAL
  Filled 2017-05-12: qty 1

## 2017-05-12 MED ORDER — FLUCONAZOLE 150 MG PO TABS: 150.0000 mg | ORAL_TABLET | Freq: Every day | ORAL | 0 refills | Status: AC

## 2017-05-12 NOTE — ED Provider Notes (Signed)
[facility] EMERGENCY DEPARTMENT Provider Note   CSN: [id number] Arrival date & time: 05/12/17  1527   History   Chief Complaint Chief Complaint  Patient presents with  . Vaginal Discharge    Krista Stout is a 27 y.o. female.  Krista   27 year old female presents today with complaints of vaginal discharge and irritation.  Patient notes 4 days ago she started taking amoxicillin as needed for a dental infection.  She notes that shortly after starting the antibiotic she developed white vaginal discharge and irritation.  She reports she is sexually active with men, notes she uses condoms every time.  She denies any abdominal pain fever nausea or vomiting.  Past Medical History:  Diagnosis Date  . Asthma   . Bacterial vaginitis     There are no active problems to display for this patient.   Past Surgical History:  Procedure Laterality Date  . HERNIA REPAIR       OB History    Gravida  1   Para      Term      Preterm      AB      Living        SAB      TAB      Ectopic      Multiple      Live Births               Home Medications    Prior to Admission medications   Medication Sig Start Date End Date Taking? Authorizing Provider  fluconazole (DIFLUCAN) 150 MG tablet Take 1 tablet (150 mg total) by mouth daily for 1 day. 05/12/17 05/13/17  Flemon Kelty, Tinnie Gens, PA-C  metoCLOPramide (REGLAN) 10 MG tablet Take 1 tablet (10 mg total) by mouth every 6 (six) hours as needed for nausea or vomiting. Patient not taking: Reported on 02/03/2016 08/12/15   Sam, Ace Gins, PA-C  metroNIDAZOLE (FLAGYL) 500 MG tablet Take 1 tablet (500 mg total) by mouth 2 (two) times daily. 06/22/16   McDonald, Mia A, PA-C  metroNIDAZOLE (METROGEL VAGINAL) 0.75 % vaginal gel Place 1 Applicatorful vaginally 2 (two) times daily. Patient not taking: Reported on 06/22/2016 02/03/16   Everlene Farrier, PA-C  ondansetron (ZOFRAN ODT) 4 MG disintegrating tablet Take 1 tablet (4  mg total) by mouth every 8 (eight) hours as needed for nausea or vomiting. Patient not taking: Reported on 06/22/2016 08/12/15   Carlene Coria, PA-C  Prenatal Vit-Fe Fumarate-FA (PRENATAL VITAMINS) 28-0.8 MG TABS Take 1 tablet by mouth as directed. Patient not taking: Reported on 06/22/2016 02/03/16   Everlene Farrier, PA-C    Family History No family history on file.  Social History Social History   Tobacco Use  . Smoking status: Former Games developer  . Smokeless tobacco: Never Used  Substance Use Topics  . Alcohol use: No  . Drug use: No    Types: Marijuana    Comment: former     Allergies   Patient has no known allergies.   Review of Systems Review of Systems  All other systems reviewed and are negative.    Physical Exam Updated Vital Signs BP (!) 146/81   Pulse 60   Temp 98.4 F (36.9 C) (Oral)   Resp 16   Ht  (1.575 m)   Wt 64.4 kg (142 lb)   LMP 04/26/2017 (Exact Date)   SpO2 99%   BMI 25.97 kg/m   Physical Exam  Constitutional: She is oriented  to person, place, and time. She appears well-developed and well-nourished.  HENT:  Head: Normocephalic and atraumatic.  Eyes: Pupils are equal, round, and reactive to light. Conjunctivae are normal. Right eye exhibits no discharge. Left eye exhibits no discharge. No scleral icterus.  Neck: Normal range of motion. No JVD present. No tracheal deviation present.  Pulmonary/Chest: Effort normal. No stridor.  Abdominal: Bowel sounds are normal. She exhibits no distension. There is no tenderness.  Genitourinary:  Genitourinary Comments: Thick white clumpy discharge noted in the vaginal vault, no cervical motion tenderness, no adnexal tenderness or masses-no rashes noted  Neurological: She is alert and oriented to person, place, and time. Coordination normal.  Psychiatric: She has a normal mood and affect. Her behavior is normal. Judgment and thought content normal.  Nursing note and vitals reviewed.    ED Treatments /  Results  Labs (all labs ordered are listed, but only abnormal results are displayed) Labs Reviewed  WET PREP, GENITAL - Abnormal; Notable for the following components:      Result Value   Yeast Wet Prep HPF POC PRESENT (*)    WBC, Wet Prep HPF POC MODERATE (*)    All other components within normal limits  I-STAT BETA HCG BLOOD, ED ([facility], [facility], [facility] ONLY)  GC/CHLAMYDIA PROBE AMP ([place]) NOT AT [facility]    EKG None  Radiology No results found.  Procedures Procedures (including critical care time)  Medications Ordered in ED Medications  fluconazole (DIFLUCAN) tablet 150 mg (has no administration in time range)     Initial Impression / Assessment and Plan / ED Course  I have reviewed the triage vital signs and the nursing notes.  Pertinent labs & imaging results that were available during my care of the patient were reviewed by me and considered in my medical decision making (see chart for details).     Patient presentation is most consistent with yeast vaginitis.  No signs of pelvic inflammatory disease or any other acute infections.  Patient treated with fluconazole discharged with a repeat dose after 72 hours with strict return precautions.  She verbalized understanding and agreement to today's plan.  Final Clinical Impressions(s) / ED Diagnoses   Final diagnoses:  Yeast vaginitis    ED Discharge Orders        Ordered    fluconazole (DIFLUCAN) 150 MG tablet  Daily     05/12/17 2137       Eyvonne Mechanic, PA-C 05/12/17 2139    Rolland Porter, MD 05/17/17 [phone]

## 2017-05-12 NOTE — ED Triage Notes (Signed)
Pt st's she started taking Amoxicillin 4 days ago from a dentist.  Now pt st's she is having vaginal discharge with irritation.

## 2017-05-12 NOTE — Discharge Instructions (Addendum)
Please read attached information. If you experience any new or worsening signs or symptoms please return to the emergency room for evaluation. Please follow-up with your primary care provider or specialist as discussed. Please use medication prescribed only as directed and discontinue taking if you have any concerning signs or symptoms.   °

## 2017-05-13 LAB — GC/CHLAMYDIA PROBE AMP (~~LOC~~) NOT AT ARMC
Chlamydia: NEGATIVE
Neisseria Gonorrhea: NEGATIVE

## 2018-06-18 ENCOUNTER — Other Ambulatory Visit: Payer: Self-pay

## 2018-06-18 ENCOUNTER — Encounter (HOSPITAL_COMMUNITY): Payer: Self-pay | Admitting: Emergency Medicine

## 2018-06-18 ENCOUNTER — Emergency Department (HOSPITAL_COMMUNITY)
Admission: EM | Admit: 2018-06-18 | Discharge: 2018-06-18 | Disposition: A | Discharge status: Home / Self Care | Payer: Medicaid - Out of State | Attending: Emergency Medicine | Admitting: Emergency Medicine

## 2018-06-18 DIAGNOSIS — R197 Diarrhea, unspecified: Secondary | ICD-10-CM | POA: Insufficient documentation

## 2018-06-18 DIAGNOSIS — R112 Nausea with vomiting, unspecified: Secondary | ICD-10-CM | POA: Diagnosis not present

## 2018-06-18 DIAGNOSIS — Z87891 Personal history of nicotine dependence: Secondary | ICD-10-CM | POA: Diagnosis not present

## 2018-06-18 DIAGNOSIS — R1084 Generalized abdominal pain: Secondary | ICD-10-CM | POA: Diagnosis not present

## 2018-06-18 LAB — URINALYSIS, ROUTINE W REFLEX MICROSCOPIC
Bacteria, UA: NONE SEEN
Bilirubin Urine: NEGATIVE
Glucose, UA: NEGATIVE mg/dL
Hgb urine dipstick: NEGATIVE
Ketones, ur: 80 mg/dL — AB
Leukocytes,Ua: NEGATIVE
Nitrite: NEGATIVE
Protein, ur: 100 mg/dL — AB
Specific Gravity, Urine: 1.031 — ABNORMAL HIGH (ref 1.005–1.030)
pH: 7 (ref 5.0–8.0)

## 2018-06-18 LAB — CBC
HCT: 42.9 % (ref 36.0–46.0)
Hemoglobin: 14.3 g/dL (ref 12.0–15.0)
MCH: 31.8 pg (ref 26.0–34.0)
MCHC: 33.3 g/dL (ref 30.0–36.0)
MCV: 95.5 fL (ref 80.0–100.0)
Platelets: 241 10*3/uL (ref 150–400)
RBC: 4.49 MIL/uL (ref 3.87–5.11)
RDW: 12.3 % (ref 11.5–15.5)
WBC: 6 10*3/uL (ref 4.0–10.5)
nRBC: 0 % (ref 0.0–0.2)

## 2018-06-18 LAB — RAPID URINE DRUG SCREEN, HOSP PERFORMED
Amphetamines: NOT DETECTED
Barbiturates: NOT DETECTED
Benzodiazepines: NOT DETECTED
Cocaine: NOT DETECTED
Opiates: NOT DETECTED
Tetrahydrocannabinol: POSITIVE — AB

## 2018-06-18 LAB — I-STAT BETA HCG BLOOD, ED (MC, WL, AP ONLY): I-stat hCG, quantitative: <5 m[IU]/mL (ref <5)

## 2018-06-18 LAB — COMPREHENSIVE METABOLIC PANEL
ALT: 18 U/L (ref 0–44)
AST: 19 U/L (ref 15–41)
Albumin: 4.5 g/dL (ref 3.5–5.0)
Alkaline Phosphatase: 73 U/L (ref 38–126)
Anion gap: 10 (ref 5–15)
BUN: 5 mg/dL — ABNORMAL LOW (ref 6–20)
CO2: 25 mmol/L (ref 22–32)
Calcium: 9.4 mg/dL (ref 8.9–10.3)
Chloride: 104 mmol/L (ref 98–111)
Creatinine, Ser: 0.9 mg/dL (ref 0.44–1.00)
GFR calc Af Amer: >60 mL/min (ref >60)
GFR calc non Af Amer: >60 mL/min (ref >60)
Glucose, Bld: 137 mg/dL — ABNORMAL HIGH (ref 70–99)
Potassium: 3.6 mmol/L (ref 3.5–5.1)
Sodium: 139 mmol/L (ref 135–145)
Total Bilirubin: 0.7 mg/dL (ref 0.3–1.2)
Total Protein: 7.6 g/dL (ref 6.5–8.1)

## 2018-06-18 LAB — LIPASE, BLOOD: Lipase: 21 U/L (ref 11–51)

## 2018-06-18 MED ORDER — FAMOTIDINE IN NACL 20-0.9 MG/50ML-% IV SOLN
20.0000 mg | Freq: Once | INTRAVENOUS | Status: AC
  Administered 2018-06-18: 20 mg via INTRAVENOUS
  Filled 2018-06-18: qty 50

## 2018-06-18 MED ORDER — METHOCARBAMOL 1000 MG/10ML IJ SOLN
1000.0000 mg | Freq: Once | INTRAVENOUS | Status: AC
  Administered 2018-06-18: 1000 mg via INTRAVENOUS
  Filled 2018-06-18: qty 10

## 2018-06-18 MED ORDER — MORPHINE SULFATE (PF) 4 MG/ML IV SOLN
4.0000 mg | Freq: Once | INTRAVENOUS | Status: AC
  Administered 2018-06-18: 4 mg via INTRAVENOUS
  Filled 2018-06-18: qty 1

## 2018-06-18 MED ORDER — SODIUM CHLORIDE 0.9 % IV BOLUS
2000.0000 mL | Freq: Once | INTRAVENOUS | Status: AC
  Administered 2018-06-18: 2000 mL via INTRAVENOUS

## 2018-06-18 MED ORDER — SODIUM CHLORIDE 0.9 % IV BOLUS
1000.0000 mL | Freq: Once | INTRAVENOUS | Status: AC
  Administered 2018-06-18: 1000 mL via INTRAVENOUS

## 2018-06-18 MED ORDER — ONDANSETRON HCL 4 MG/2ML IJ SOLN
4.0000 mg | Freq: Once | INTRAMUSCULAR | Status: AC | PRN
  Administered 2018-06-18: 4 mg via INTRAVENOUS
  Filled 2018-06-18: qty 2

## 2018-06-18 MED ORDER — KETOROLAC TROMETHAMINE 15 MG/ML IJ SOLN
15.0000 mg | Freq: Once | INTRAMUSCULAR | Status: AC
  Administered 2018-06-18: 15 mg via INTRAVENOUS
  Filled 2018-06-18: qty 1

## 2018-06-18 MED ORDER — SODIUM CHLORIDE 0.9% FLUSH
3.0000 mL | Freq: Once | INTRAVENOUS | Status: AC
  Administered 2018-06-18: 3 mL via INTRAVENOUS

## 2018-06-18 MED ORDER — ONDANSETRON 4 MG PO TBDP: 4.0000 mg | ORAL_TABLET | Freq: Three times a day (TID) | ORAL | 0 refills | Status: DC | PRN

## 2018-06-18 MED ORDER — METHOCARBAMOL 1000 MG/10ML IJ SOLN: 1000.0000 mg | Freq: Once | INTRAMUSCULAR | Status: DC

## 2018-06-18 MED ORDER — PROMETHAZINE HCL 25 MG/ML IJ SOLN
12.5000 mg | Freq: Once | INTRAMUSCULAR | Status: AC
  Administered 2018-06-18: 12.5 mg via INTRAVENOUS

## 2018-06-18 NOTE — ED Triage Notes (Signed)
Pt arrives to ED from home with complaints of generalized abdominal pain, nausea, and vomiting since yesterday around 4pm. Pt states her emesis is green.

## 2018-06-18 NOTE — ED Provider Notes (Signed)
[facility] EMERGENCY DEPARTMENT Provider Note   CSN: [id number] Arrival date & time: 06/18/18  0944    History   Chief Complaint Chief Complaint  Patient presents with  . Abdominal Pain  . Emesis    HPI Krista Stout is a 28 y.o. female who presents with nausea, vomiting, abdominal pain.  Past medical history significant for asthma, BV.  She states that she started having multiple episodes of nausea, green-colored emesis, and diarrhea yesterday.  Today she feels weak and fatigued and dehydrated so she felt like she needed to come to the emergency department.  She reports generalized abdominal pain.  She denies any sick contacts but is unsure if she has food poisoning.  No fever but she has been having chills.  No urinary symptoms, vaginal discharge or bleeding.  Past surgical history significant for hernia repair.     HPI  Past Medical History:  Diagnosis Date  . Asthma   . Bacterial vaginitis     There are no active problems to display for this patient.   Past Surgical History:  Procedure Laterality Date  . HERNIA REPAIR       OB History    Gravida  1   Para      Term      Preterm      AB      Living        SAB      TAB      Ectopic      Multiple      Live Births               Home Medications    Prior to Admission medications   Medication Sig Start Date End Date Taking? Authorizing Provider  metoCLOPramide (REGLAN) 10 MG tablet Take 1 tablet (10 mg total) by mouth every 6 (six) hours as needed for nausea or vomiting. Patient not taking: Reported on 02/03/2016 08/12/15   Sam, Ace Gins, PA-C  metroNIDAZOLE (FLAGYL) 500 MG tablet Take 1 tablet (500 mg total) by mouth 2 (two) times daily. 06/22/16   McDonald, Mia A, PA-C  metroNIDAZOLE (METROGEL VAGINAL) 0.75 % vaginal gel Place 1 Applicatorful vaginally 2 (two) times daily. Patient not taking: Reported on 06/22/2016 02/03/16   Everlene Farrier, PA-C  ondansetron (ZOFRAN ODT) 4 MG  disintegrating tablet Take 1 tablet (4 mg total) by mouth every 8 (eight) hours as needed for nausea or vomiting. Patient not taking: Reported on 06/22/2016 08/12/15   Carlene Coria, PA-C  Prenatal Vit-Fe Fumarate-FA (PRENATAL VITAMINS) 28-0.8 MG TABS Take 1 tablet by mouth as directed. Patient not taking: Reported on 06/22/2016 02/03/16   Everlene Farrier, PA-C    Family History History reviewed. No pertinent family history.  Social History Social History   Tobacco Use  . Smoking status: Former Games developer  . Smokeless tobacco: Never Used  Substance Use Topics  . Alcohol use: No  . Drug use: No    Types: Marijuana    Comment: former     Allergies   Patient has no known allergies.   Review of Systems Review of Systems  Constitutional: Positive for appetite change, chills and fatigue. Negative for fever.  Respiratory: Negative for shortness of breath.   Cardiovascular: Negative for chest pain.  Gastrointestinal: Positive for abdominal pain, diarrhea, nausea and vomiting.  Genitourinary: Negative for dysuria, flank pain, pelvic pain, vaginal bleeding and vaginal discharge.  All other systems reviewed and are negative.    Physical  Exam Updated Vital Signs BP 136/84 (BP Location: Right Arm)   Pulse (!) 56   Temp 98.2 F (36.8 C) (Oral)   Resp 18   Ht 5\' 1"  (1.549 m)   Wt 65.3 kg   SpO2 99%   BMI 27.21 kg/m   Physical Exam Vitals signs and nursing note reviewed.  Constitutional:      General: She is not in acute distress.    Appearance: She is well-developed. She is not ill-appearing.     Comments: Appears fatigued.  Calm and cooperative  HENT:     Head: Normocephalic and atraumatic.  Eyes:     General: No scleral icterus.       Right eye: No discharge.        Left eye: No discharge.     Conjunctiva/sclera: Conjunctivae normal.     Pupils: Pupils are equal, round, and reactive to light.  Neck:     Musculoskeletal: Normal range of motion.  Cardiovascular:     Rate  and Rhythm: Normal rate and regular rhythm.  Pulmonary:     Effort: Pulmonary effort is normal. No respiratory distress.     Breath sounds: Normal breath sounds.  Abdominal:     General: Abdomen is flat. Bowel sounds are normal. There is no distension.     Palpations: Abdomen is soft.     Tenderness: There is generalized abdominal tenderness.  Skin:    General: Skin is warm and dry.  Neurological:     Mental Status: She is alert and oriented to person, place, and time.  Psychiatric:        Behavior: Behavior normal.      ED Treatments / Results  Labs (all labs ordered are listed, but only abnormal results are displayed) Labs Reviewed  COMPREHENSIVE METABOLIC PANEL - Abnormal; Notable for the following components:      Result Value   Glucose, Bld 137 (*)    BUN 5 (*)    All other components within normal limits  URINALYSIS, ROUTINE W REFLEX MICROSCOPIC - Abnormal; Notable for the following components:   Specific Gravity, Urine 1.031 (*)    Ketones, ur 80 (*)    Protein, ur 100 (*)    All other components within normal limits  RAPID URINE DRUG SCREEN, HOSP PERFORMED - Abnormal; Notable for the following components:   Tetrahydrocannabinol POSITIVE (*)    All other components within normal limits  LIPASE, BLOOD  CBC  I-STAT BETA HCG BLOOD, ED ([facility], [facility], [facility] ONLY)    EKG None  Radiology No results found.  Procedures Procedures (including critical care time)  Medications Ordered in ED Medications  ondansetron (ZOFRAN) injection 4 mg (4 mg Intravenous Given 06/18/18 1008)  sodium chloride flush (NS) 0.9 % injection 3 mL (3 mLs Intravenous Given 06/18/18 1013)  ketorolac (TORADOL) 15 MG/ML injection 15 mg (15 mg Intravenous Given 06/18/18 1107)  sodium chloride 0.9 % bolus 1,000 mL (0 mLs Intravenous Stopped 06/18/18 1249)  famotidine (PEPCID) IVPB 20 mg premix (0 mg Intravenous Stopped 06/18/18 1152)  promethazine (PHENERGAN) injection 12.5 mg (12.5 mg Intravenous Given 06/18/18  1249)  methocarbamol (ROBAXIN) 1,000 mg in dextrose 5 % 50 mL IVPB (0 mg Intravenous Stopped 06/18/18 1349)  sodium chloride 0.9 % bolus 1,000 mL (0 mLs Intravenous Stopped 06/18/18 1436)  sodium chloride 0.9 % bolus 2,000 mL (0 mLs Intravenous Stopped 06/18/18 1651)  morphine 4 MG/ML injection 4 mg (4 mg Intravenous Given 06/18/18 1357)     Initial Impression /  Assessment and Plan / ED Course  I have reviewed the triage vital signs and the nursing notes.  Pertinent labs & imaging results that were available during my care of the patient were reviewed by me and considered in my medical decision making (see chart for details).  28 year old female presents with nausea, vomiting, diarrhea and generalized abdominal pain.  She is mildly bradycardic but otherwise vital signs are normal.  She is fatigued appearing on exam.  Abdomen is generally tender.  Will obtain blood work, urine, pregnancy test.  Will give fluids.  She is already received some Zofran.  CBC is normal.  CMP is remarkable for mild hyperglycemia.  Pregnancy test is negative.  UA has 80 ketones but no signs of infection.  UDS is positive for marijuana.  Suspect acute GI illness versus cannabis hyperemesis syndrome.  She was given fluids, Toradol, Pepcid.  Her primary RN informed me that she has continued to vomit and is requesting something for muscle spasms.  Will order Phenergan and Robaxin.  1:49 PM Rechecked patient.  She states pain is slightly better and is now 8 out of 10.  We will give additional fluid bolus and morphine.  Patient states she is not ready for p.o. challenge.  3:30PM: Rechecked patient again.  She states she would like to go home and try and drink something.  She was given cranberry juice and tolerated a couple sips.  She states that she just feels generally tired but nausea and pain are better. She was given Rx for Zofran and return precautions.    Final Clinical Impressions(s) / ED Diagnoses   Final diagnoses:   Nausea vomiting and diarrhea    ED Discharge Orders    None       Bethel Born, PA-C 06/19/18 0745    Derwood Kaplan, MD 06/19/18 1728

## 2018-06-18 NOTE — Discharge Instructions (Addendum)
Take Zofran as needed for nausea/vomiting Follow BRAT diet until you are feeling better Please return if worsening

## 2018-06-19 ENCOUNTER — Emergency Department (HOSPITAL_COMMUNITY)
Admission: EM | Admit: 2018-06-19 | Discharge: 2018-06-19 | Disposition: A | Discharge status: Home / Self Care | Payer: Medicaid - Out of State | Attending: Emergency Medicine | Admitting: Emergency Medicine

## 2018-06-19 ENCOUNTER — Other Ambulatory Visit: Payer: Self-pay | Admitting: Gynecologic Oncology

## 2018-06-19 ENCOUNTER — Emergency Department (HOSPITAL_COMMUNITY)
Admission: EM | Admit: 2018-06-19 | Discharge: 2018-06-20 | Disposition: A | Discharge status: Home / Self Care | Payer: Medicaid - Out of State | Source: Home / Self Care | Attending: Emergency Medicine | Admitting: Emergency Medicine

## 2018-06-19 ENCOUNTER — Emergency Department (HOSPITAL_COMMUNITY): Payer: Medicaid - Out of State

## 2018-06-19 ENCOUNTER — Other Ambulatory Visit: Payer: Self-pay

## 2018-06-19 ENCOUNTER — Encounter (HOSPITAL_COMMUNITY): Payer: Self-pay | Admitting: Emergency Medicine

## 2018-06-19 DIAGNOSIS — K920 Hematemesis: Secondary | ICD-10-CM | POA: Insufficient documentation

## 2018-06-19 DIAGNOSIS — R197 Diarrhea, unspecified: Secondary | ICD-10-CM | POA: Insufficient documentation

## 2018-06-19 DIAGNOSIS — R109 Unspecified abdominal pain: Secondary | ICD-10-CM | POA: Insufficient documentation

## 2018-06-19 DIAGNOSIS — Z87891 Personal history of nicotine dependence: Secondary | ICD-10-CM | POA: Insufficient documentation

## 2018-06-19 DIAGNOSIS — R1900 Intra-abdominal and pelvic swelling, mass and lump, unspecified site: Secondary | ICD-10-CM | POA: Insufficient documentation

## 2018-06-19 DIAGNOSIS — R112 Nausea with vomiting, unspecified: Secondary | ICD-10-CM | POA: Insufficient documentation

## 2018-06-19 DIAGNOSIS — J45909 Unspecified asthma, uncomplicated: Secondary | ICD-10-CM | POA: Insufficient documentation

## 2018-06-19 DIAGNOSIS — Z1159 Encounter for screening for other viral diseases: Secondary | ICD-10-CM | POA: Diagnosis not present

## 2018-06-19 DIAGNOSIS — Z20828 Contact with and (suspected) exposure to other viral communicable diseases: Secondary | ICD-10-CM | POA: Insufficient documentation

## 2018-06-19 LAB — CBC WITH DIFFERENTIAL/PLATELET
Abs Immature Granulocytes: 0.02 10*3/uL (ref 0.00–0.07)
Abs Immature Granulocytes: 0.03 10*3/uL (ref 0.00–0.07)
Basophils Absolute: 0 10*3/uL (ref 0.0–0.1)
Basophils Absolute: 0 10*3/uL (ref 0.0–0.1)
Basophils Relative: 0 %
Basophils Relative: 0 %
Eosinophils Absolute: 0 10*3/uL (ref 0.0–0.5)
Eosinophils Absolute: 0 10*3/uL (ref 0.0–0.5)
Eosinophils Relative: 0 %
Eosinophils Relative: 0 %
HCT: 39.6 % (ref 36.0–46.0)
HCT: 40.7 % (ref 36.0–46.0)
Hemoglobin: 13.5 g/dL (ref 12.0–15.0)
Hemoglobin: 13.7 g/dL (ref 12.0–15.0)
Immature Granulocytes: 0 %
Immature Granulocytes: 0 %
Lymphocytes Relative: 21 %
Lymphocytes Relative: 27 %
Lymphs Abs: 2 10*3/uL (ref 0.7–4.0)
Lymphs Abs: 2 10*3/uL (ref 0.7–4.0)
MCH: 31.9 pg (ref 26.0–34.0)
MCH: 31.6 pg (ref 26.0–34.0)
MCHC: 34.1 g/dL (ref 30.0–36.0)
MCHC: 33.7 g/dL (ref 30.0–36.0)
MCV: 93.6 fL (ref 80.0–100.0)
MCV: 93.8 fL (ref 80.0–100.0)
Monocytes Absolute: 0.7 10*3/uL (ref 0.1–1.0)
Monocytes Absolute: 0.7 10*3/uL (ref 0.1–1.0)
Monocytes Relative: 7 %
Monocytes Relative: 10 %
Neutro Abs: 6.8 10*3/uL (ref 1.7–7.7)
Neutro Abs: 4.7 10*3/uL (ref 1.7–7.7)
Neutrophils Relative %: 72 %
Neutrophils Relative %: 63 %
Platelets: 235 10*3/uL (ref 150–400)
Platelets: 236 10*3/uL (ref 150–400)
RBC: 4.23 MIL/uL (ref 3.87–5.11)
RBC: 4.34 MIL/uL (ref 3.87–5.11)
RDW: 12.3 % (ref 11.5–15.5)
RDW: 12.3 % (ref 11.5–15.5)
WBC: 9.6 10*3/uL (ref 4.0–10.5)
WBC: 7.4 10*3/uL (ref 4.0–10.5)
nRBC: 0 % (ref 0.0–0.2)
nRBC: 0 % (ref 0.0–0.2)

## 2018-06-19 LAB — COMPREHENSIVE METABOLIC PANEL
ALT: 19 U/L (ref 0–44)
AST: 18 U/L (ref 15–41)
Albumin: 4.1 g/dL (ref 3.5–5.0)
Alkaline Phosphatase: 65 U/L (ref 38–126)
Anion gap: 11 (ref 5–15)
BUN: <5 mg/dL — ABNORMAL LOW (ref 6–20)
CO2: 22 mmol/L (ref 22–32)
Calcium: 8.9 mg/dL (ref 8.9–10.3)
Chloride: 104 mmol/L (ref 98–111)
Creatinine, Ser: 0.82 mg/dL (ref 0.44–1.00)
GFR calc Af Amer: >60 mL/min (ref >60)
GFR calc non Af Amer: >60 mL/min (ref >60)
Glucose, Bld: 108 mg/dL — ABNORMAL HIGH (ref 70–99)
Potassium: 3.2 mmol/L — ABNORMAL LOW (ref 3.5–5.1)
Sodium: 137 mmol/L (ref 135–145)
Total Bilirubin: 0.6 mg/dL (ref 0.3–1.2)
Total Protein: 7.1 g/dL (ref 6.5–8.1)

## 2018-06-19 LAB — URINALYSIS, ROUTINE W REFLEX MICROSCOPIC
Bilirubin Urine: NEGATIVE
Glucose, UA: 50 mg/dL — AB
Hgb urine dipstick: NEGATIVE
Ketones, ur: 80 mg/dL — AB
Leukocytes,Ua: NEGATIVE
Nitrite: NEGATIVE
Protein, ur: NEGATIVE mg/dL
Specific Gravity, Urine: 1.02 (ref 1.005–1.030)
pH: 6 (ref 5.0–8.0)

## 2018-06-19 LAB — SARS CORONAVIRUS 2 BY RT PCR (HOSPITAL ORDER, PERFORMED IN ~~LOC~~ HOSPITAL LAB): SARS Coronavirus 2: NEGATIVE

## 2018-06-19 LAB — LIPASE, BLOOD: Lipase: 27 U/L (ref 11–51)

## 2018-06-19 MED ORDER — PROMETHAZINE HCL 25 MG RE SUPP: 25.0000 mg | Freq: Four times a day (QID) | RECTAL | 0 refills | Status: DC | PRN

## 2018-06-19 MED ORDER — TRAMADOL HCL 50 MG PO TABS: 50.0000 mg | ORAL_TABLET | Freq: Four times a day (QID) | ORAL | 0 refills | Status: DC | PRN

## 2018-06-19 MED ORDER — DICYCLOMINE HCL 20 MG PO TABS: 20.0000 mg | ORAL_TABLET | Freq: Two times a day (BID) | ORAL | 0 refills | Status: DC

## 2018-06-19 MED ORDER — IOHEXOL 300 MG/ML  SOLN
100.0000 mL | Freq: Once | INTRAMUSCULAR | Status: AC | PRN
  Administered 2018-06-19: 100 mL via INTRAVENOUS

## 2018-06-19 MED ORDER — METOCLOPRAMIDE HCL 5 MG/ML IJ SOLN
10.0000 mg | Freq: Once | INTRAMUSCULAR | Status: AC
  Administered 2018-06-19: 10 mg via INTRAVENOUS
  Filled 2018-06-19: qty 2

## 2018-06-19 MED ORDER — SODIUM CHLORIDE 0.9 % IV BOLUS
1000.0000 mL | Freq: Once | INTRAVENOUS | Status: AC
  Administered 2018-06-19: 1000 mL via INTRAVENOUS

## 2018-06-19 MED ORDER — FENTANYL CITRATE (PF) 100 MCG/2ML IJ SOLN
50.0000 ug | Freq: Once | INTRAMUSCULAR | Status: AC
  Administered 2018-06-19: 50 ug via INTRAVENOUS
  Filled 2018-06-19: qty 2

## 2018-06-19 MED ORDER — ONDANSETRON HCL 4 MG/2ML IJ SOLN
4.0000 mg | Freq: Once | INTRAMUSCULAR | Status: AC
  Administered 2018-06-19: 4 mg via INTRAVENOUS
  Filled 2018-06-19: qty 2

## 2018-06-19 MED ORDER — MORPHINE SULFATE (PF) 4 MG/ML IV SOLN
4.0000 mg | Freq: Once | INTRAVENOUS | Status: AC
  Administered 2018-06-19: 4 mg via INTRAVENOUS
  Filled 2018-06-19: qty 1

## 2018-06-19 NOTE — ED Notes (Signed)
Patient verbalizes understanding of discharge instructions. Opportunity for questioning and answers were provided. Armband removed by staff, pt discharged from ED. Ambulated out to lobby  

## 2018-06-19 NOTE — ED Triage Notes (Signed)
Pt here for continued nausea/vomiting, states she has been seen the last 2 days for the same. Reports she is unable to keep her meds down.

## 2018-06-19 NOTE — ED Notes (Signed)
Pt in CT.

## 2018-06-19 NOTE — Discharge Instructions (Addendum)
Dr. Oliver Hum office will call you on Monday to schedule a follow up MRI for further evaluation of your pelvic mass.   Get help right away if: You have chest pain. You feel extremely weak or you faint. You see blood in your vomit. Your vomit looks like coffee grounds. You have bloody or black stools or stools that look like tar. You have a severe headache, a stiff neck, or both. You have a rash. You have severe pain, cramping, or bloating in your abdomen. You have trouble breathing or you are breathing very quickly. Your heart is beating very quickly. Your skin feels cold and clammy. You feel confused. You have pain when you urinate. You have signs of dehydration, such as: Dark urine, very little urine, or no urine. Cracked lips. Dry mouth. Sunken eyes. Sleepiness. Weakness.

## 2018-06-19 NOTE — ED Notes (Signed)
Pt here yesterday for same -- has been unable to keep medicine down that was prescribed for vomiting, still having pain in abd.

## 2018-06-19 NOTE — ED Notes (Signed)
Nurse will start IV and draw labs. 

## 2018-06-19 NOTE — ED Triage Notes (Signed)
Pt in with n/v/d x 24hrs, seen yesterday for same. States her abdominal pain and symptoms are not resolved

## 2018-06-19 NOTE — ED Notes (Signed)
Pt having dry heaves-- ambulated without difficulty to bathroom, UA obtained

## 2018-06-19 NOTE — Progress Notes (Signed)
Patient seen in ED with pelvic mass. Unclear its origin from CT and [place]. Will order MRI pelvis to better characterize its origins. Luisa Dago, MD

## 2018-06-19 NOTE — ED Provider Notes (Signed)
[facility] Provider [facility]   CSN: [id number] Arrival date & time: 06/19/18  [id number]    History   Chief Complaint Chief Complaint  Patient presents with  . N/V/D    HPI Krista Stout is a 28 y.o. female.  Who presents emergency department chief complaint of abdominal pain, nausea vomiting and diarrhea.  She was seen here for the same complaint yesterday.  Review of EMR shows that she had a negative pregnancy test.  She has a previous surgical history of ventral hernia repair.  She denies any recent foreign travel, contacts with similar symptoms.  Patient states that after discharge she was unable to hold down any of her medications.  She has been unable to hold down fluids.  She has had persistent nausea and intermittent vomiting of nonbloody nonbilious vomitus.  She has brown watery stools.  She has no recent antibiotic use.  She denies urinary symptoms or vaginal symptoms.    HPI  Past Medical History:  Diagnosis Date  . Asthma   . Bacterial vaginitis     Patient Active Problem List   Diagnosis Date Noted  . Intractable nausea and vomiting 06/20/2018  . Hypokalemia 06/20/2018  . Chest pain 06/20/2018  . Abdominal pain with vomiting 06/20/2018  . Asthma with status asthmaticus   . Pelvic mass 06/19/2018    Past Surgical History:  Procedure Laterality Date  . HERNIA REPAIR       OB History    Gravida  1   Para      Term      Preterm      AB      Living        SAB      TAB      Ectopic      Multiple      Live Births               Home Medications    Prior to Admission medications   Medication Sig Start Date End Date Taking? Authorizing Provider  dicyclomine (BENTYL) 20 MG tablet Take 1 tablet (20 mg total) by mouth 2 (two) times daily. 06/19/18   Arthor CaptainHarris, Ondre Salvetti, PA-C  HYDROcodone-acetaminophen (NORCO/VICODIN) 5-325 MG tablet Take 1-2 tablets by mouth every 6 (six) hours as needed. 06/20/18   Roxy HorsemanBrowning, Robert,  PA-C  ondansetron (ZOFRAN ODT) 4 MG disintegrating tablet Take 1 tablet (4 mg total) by mouth every 8 (eight) hours as needed for nausea or vomiting. 06/18/18   Bethel BornGekas, Kelly Marie, PA-C  promethazine (PHENERGAN) 25 MG suppository Place 1 suppository (25 mg total) rectally every 6 (six) hours as needed for nausea or vomiting. 06/20/18   Roxy HorsemanBrowning, Robert, PA-C  traMADol (ULTRAM) 50 MG tablet Take 1 tablet (50 mg total) by mouth every 6 (six) hours as needed. 06/19/18   Arthor CaptainHarris, Luc Shammas, PA-C    Family History No family history on file.  Social History Social History   Tobacco Use  . Smoking status: Former Games developermoker  . Smokeless tobacco: Never Used  Substance Use Topics  . Alcohol use: No  . Drug use: No    Types: Marijuana    Comment: former     Allergies   Patient has no known allergies.   Review of Systems Review of Systems  Ten systems reviewed and are negative for acute change, except as noted in the HPI.   Physical Exam Updated Vital Signs BP (!) 148/79 (BP Location: Right Arm)   Pulse (!) 54  Temp 99 F (37.2 C) (Oral)   Resp 16   Wt 65.3 kg   LMP 06/04/2018 (Exact Date)   SpO2 100%   BMI 27.20 kg/m   Physical Exam Vitals signs and nursing note reviewed.  Constitutional:      General: She is not in acute distress.    Appearance: She is well-developed. She is not diaphoretic.  HENT:     Head: Normocephalic and atraumatic.  Eyes:     General: No scleral icterus.    Conjunctiva/sclera: Conjunctivae normal.  Neck:     Musculoskeletal: Normal range of motion.  Cardiovascular:     Rate and Rhythm: Normal rate and regular rhythm.     Heart sounds: Normal heart sounds. No murmur. No friction rub. No gallop.   Pulmonary:     Effort: Pulmonary effort is normal. No respiratory distress.     Breath sounds: Normal breath sounds.  Abdominal:     General: Abdomen is flat. Bowel sounds are normal. There is no distension.     Palpations: Abdomen is soft. There is no mass.      Tenderness: There is generalized abdominal tenderness. There is guarding. There is no rebound.  Skin:    General: Skin is warm and dry.  Neurological:     Mental Status: She is alert and oriented to person, place, and time.  Psychiatric:        Behavior: Behavior normal.      ED Treatments / Results  Labs (all labs ordered are listed, but only abnormal results are displayed) Labs Reviewed  COMPREHENSIVE METABOLIC PANEL - Abnormal; Notable for the following components:      Result Value   Potassium 3.2 (*)    Glucose, Bld 108 (*)    BUN <5 (*)    All other components within normal limits  URINALYSIS, ROUTINE W REFLEX MICROSCOPIC - Abnormal; Notable for the following components:   Glucose, UA 50 (*)    Ketones, ur 80 (*)    All other components within normal limits  CBC WITH DIFFERENTIAL/PLATELET  LIPASE, BLOOD    EKG None  Radiology Dg Chest Port 1 View  Result Date: 06/20/2018 CLINICAL DATA:  27 year old female with history of chest pain. EXAM: PORTABLE CHEST 1 VIEW COMPARISON:  No priors. FINDINGS: Lung volumes are normal. No consolidative airspace disease. No pleural effusions. No pneumothorax. No pulmonary nodule or mass noted. Pulmonary vasculature and the cardiomediastinal silhouette are within normal limits. IMPRESSION: No radiographic evidence of acute cardiopulmonary disease. Electronically Signed   By: Trudie Reed M.D.   On: 06/20/2018 22:19    Procedures Procedures (including critical care time)  Medications Ordered in ED Medications  sodium chloride 0.9 % bolus 1,000 mL (0 mLs Intravenous Stopped 06/19/18 1439)  fentaNYL (SUBLIMAZE) injection 50 mcg (50 mcg Intravenous Given 06/19/18 1026)  metoCLOPramide (REGLAN) injection 10 mg (10 mg Intravenous Given 06/19/18 1025)  iohexol (OMNIPAQUE) 300 MG/ML solution 100 mL (100 mLs Intravenous Contrast Given 06/19/18 1203)  fentaNYL (SUBLIMAZE) injection 50 mcg (50 mcg Intravenous Given 06/19/18 1546)  metoCLOPramide  (REGLAN) injection 10 mg (10 mg Intravenous Given 06/19/18 1545)     Initial Impression / Assessment and Plan / ED Course  I have reviewed the triage vital signs and the nursing notes.  Pertinent labs & imaging results that were available during my care of the patient were reviewed by me and considered in my medical decision making (see chart for details).        CC:n/v/d VS:  BP (!) 148/79 (BP Location: Right Arm)   Pulse (!) 54   Temp 99 F (37.2 C) (Oral)   Resp 16   Wt 65.3 kg   LMP 06/04/2018 (Exact Date)   SpO2 100%   BMI 27.20 kg/m  ZO:XWRUEAV is gathered by patient and the patient, review of EMR. DDX:The causes for generalized abdominal pain include but are not limited to gastritis, gastroenteritis, IBS, pancreatitis, peritonitis, intestinal ischemia, constipation, UTI, intestinal obstruction, perforated viscus, eg, peptic ulcer, appendix, gallbladder, diverticulitis, physical or sexual abuse, abdominal abscess, ruptured ectopic pregnancy, ruptured spleen, AAA, diabetic ketoacidosis, hypercalcemia, uremia, parasitic or other infection, eg: tapeworms, flukes, Giargia, Cryto, Yersinia, adrenal insufficiency,lead poisoning, iron toxicity, polyarteritis nodosa, Henoch-Schnlein purpura, porphyria, eg, acute intermittent porphyria, familial Mediterranean fever.  Labs: I reviewed the labs which show UA with ketones and glucose, no signs of infection. No leukocytosis, mild hypokalemia, normal lipase,HCG negative yesterday.  Imaging: I personally reviewed the images (CT abd/pelvis and Pelvic/ transvaginal US) which show(s) a solitary prerectal mass with art/venous blodflow EKG:N/A MDM: Patient here with n/v/d- she does have a UDS pos for THC, and this may reflect cannabis hyperemesis or viral process. There does not appear to be an emergent cause of sxs. I believe the mass is an incidental finding and not related to current sxs, which ar well controlled. I have discussed the case with GYN  Dr. Vergie Living who reviewed the films with me. I have also discussed the case with Dr. Andrey Farmer of [facility] who will see the patient in her office and schedule OP MRI for further characterization. I have discussed all findings with the patient and her mother via phone at the request. I discussed the differential on the mass which ranges from benign to cancerous. The patient expresses understanding and the importance of follow up. She does not appear to have rectal obstruction form the mass causing diarrhea.  She will  Be discharged with symptomatic treatment.  PDMP reviewed during this encounter.  Patient disposition:discharge Patient condition: good. The patient appears reasonably screened and/or stabilized for discharge and I doubt any other medical condition or other [facility] requiring further screening, evaluation, or treatment in the ED at this time prior to discharge. I have discussed lab and/or imaging findings with the patient and answered all questions/concerns to the best of my ability. I have discussed return precautions and OP follow up.      Final Clinical Impressions(s) / ED Diagnoses   Final diagnoses:  Nausea vomiting and diarrhea  Pelvic mass    ED Discharge Orders         Ordered    promethazine (PHENERGAN) 25 MG suppository  Every 6 hours PRN,   Status:  Discontinued     06/19/18 1621    traMADol (ULTRAM) 50 MG tablet  Every 6 hours PRN     06/19/18 1621    dicyclomine (BENTYL) 20 MG tablet  2 times daily     06/19/18 1621           Arthor Captain, PA-C 06/22/18 1108    Sabas Sous, MD 06/24/18 1317

## 2018-06-19 NOTE — ED Notes (Signed)
Pt. Aware urine is needed. States she is unable to provide any at this time.  

## 2018-06-19 NOTE — ED Provider Notes (Signed)
[facility] Provider Note   CSN: [id number] Arrival date & time: 06/19/18  2103    History   Chief Complaint Chief Complaint  Patient presents with   Emesis    HPI Krista Stout is a 28 y.o. female.     Patient returns again to the emergency department today with a chief complaint of nausea, vomiting, diarrhea.  She was seen this morning for the same.  States that she is still having persistent symptoms and is unable to keep anything down.  She reports persistent cramping sensation in her abdomen.  Denies any fevers.  States that she is vomiting some streaky blood.  Denies any alcohol use, marijuana use.  She had CT performed earlier today which showed an adnexal mass, which is concerning for neoplasm, and was referred to OB/GYN.  She has tried taking Zofran ODT with no relief.  The history is provided by the patient. No language interpreter was used.    Past Medical History:  Diagnosis Date   Asthma    Bacterial vaginitis     Patient Active Problem List   Diagnosis Date Noted   Pelvic mass 06/19/2018    Past Surgical History:  Procedure Laterality Date   HERNIA REPAIR       OB History    Gravida  1   Para      Term      Preterm      AB      Living        SAB      TAB      Ectopic      Multiple      Live Births               Home Medications    Prior to Admission medications   Medication Sig Start Date End Date Taking? Authorizing Provider  dicyclomine (BENTYL) 20 MG tablet Take 1 tablet (20 mg total) by mouth 2 (two) times daily. 06/19/18   Harris, Abigail, PA-C  ondansetron (ZOFRAN ODT) 4 MG disintegrating tablet Take 1 tablet (4 mg total) by mouth every 8 (eight) hours as needed for nausea or vomiting. 06/18/18   Bethel BornGekas, Kelly Marie, PA-C  promethazine (PHENERGAN) 25 MG suppository Place 1 suppository (25 mg total) rectally every 6 (six) hours as needed for nausea or vomiting. 06/19/18   Harris,  Cammy CopaAbigail, PA-C  traMADol (ULTRAM) 50 MG tablet Take 1 tablet (50 mg total) by mouth every 6 (six) hours as needed. 06/19/18   Arthor CaptainHarris, Abigail, PA-C    Family History No family history on file.  Social History Social History   Tobacco Use   Smoking status: Former Smoker   Smokeless tobacco: Never Used  Substance Use Topics   Alcohol use: No   Drug use: No    Types: Marijuana    Comment: former     Allergies   Patient has no known allergies.   Review of Systems Review of Systems  All other systems reviewed and are negative.    Physical Exam Updated Vital Signs BP (!) 153/98 (BP Location: Right Arm)    Pulse (!) 59    Temp 98.5 F (36.9 C)    Resp 18    SpO2 100%   Physical Exam Vitals signs and nursing note reviewed.  Constitutional:      General: She is not in acute distress.    Appearance: She is well-developed.  HENT:     Head: Normocephalic and atraumatic.  Eyes:     Conjunctiva/sclera: Conjunctivae normal.  Neck:     Musculoskeletal: Neck supple.  Cardiovascular:     Rate and Rhythm: Normal rate and regular rhythm.     Heart sounds: No murmur.  Pulmonary:     Effort: Pulmonary effort is normal. No respiratory distress.     Breath sounds: Normal breath sounds.  Abdominal:     Palpations: Abdomen is soft.     Tenderness: There is no abdominal tenderness.  Musculoskeletal: Normal range of motion.  Skin:    General: Skin is warm and dry.  Neurological:     Mental Status: She is alert and oriented to person, place, and time.  Psychiatric:        Mood and Affect: Mood normal.        Behavior: Behavior normal.        Thought Content: Thought content normal.        Judgment: Judgment normal.      ED Treatments / Results  Labs (all labs ordered are listed, but only abnormal results are displayed) Labs Reviewed  SARS CORONAVIRUS 2 (HOSPITAL ORDER, PERFORMED IN [facility])  CBC WITH DIFFERENTIAL/PLATELET  COMPREHENSIVE METABOLIC PANEL   LIPASE, BLOOD    EKG None  Radiology US Transvaginal Non-ob  Result Date: 06/19/2018 CLINICAL DATA:  Pelvic mass in the presacral space on an abdomen and pelvis CT obtained yesterday. This was concerning for a possible ovarian neoplasm. She presented with acute generalized abdominal pain with nausea, vomiting and diarrhea. Bacterial vaginitis. EXAM: TRANSABDOMINAL AND TRANSVAGINAL ULTRASOUND OF PELVIS DOPPLER ULTRASOUND OF OVARIES TECHNIQUE: Both transabdominal and transvaginal ultrasound examinations of the pelvis were performed. Transabdominal technique was performed for global imaging of the pelvis including uterus, ovaries, adnexal regions, and pelvic cul-de-sac. It was necessary to proceed with endovaginal exam following the transabdominal exam to visualize the uterus, ovaries and pelvic structures in better detail. Color and duplex Doppler ultrasound was utilized to evaluate blood flow to the ovaries. COMPARISON:  Abdomen and pelvis CT obtained yesterday. FINDINGS: Uterus Measurements: 7.4 x 4.7 x 4.0 cm = volume: 73 mL. No fibroids or other mass visualized. Endometrium Thickness: 8.9 mm.  No focal abnormality visualized. Right ovary Measurements: 4.1 x 2.3 x 2.0 cm = volume: 10 mL. Normal appearance/no adnexal mass. Left ovary Measurements: 3.0 x 2.6 x 1.8 cm = volume: 7 mL. Normal appearance/no adnexal mass. Pulsed Doppler evaluation of both ovaries demonstrates normal low-resistance arterial and venous waveforms. Other findings There is an oval, mildly heterogeneous and mildly hypoechoic mass in the central pelvis adjacent to the uterus and left ovary. This is circumscribed and measures 5.2 x 4.2 x 3.3 cm. This contains internal blood flow with color Doppler and is separate from the uterus and ovaries. Also noted is a small amount of free peritoneal fluid. IMPRESSION: 1. 5.2 cm oval, solid mass in the pelvic cul-de-sac. This appears separate from the uterus and ovaries and has nonspecific  features. This could represent a benign or malignant neoplasm with possibilities including sarcoma and neurogenic tumor. 2. Small amount of free peritoneal fluid. 3. Normal appearing uterus and ovaries. Electronically Signed   By: Beckie Salts M.D.   On: 06/19/2018 14:44   US Pelvis Complete  Result Date: 06/19/2018 CLINICAL DATA:  Pelvic mass in the presacral space on an abdomen and pelvis CT obtained yesterday. This was concerning for a possible ovarian neoplasm. She presented with acute generalized abdominal pain with nausea, vomiting and diarrhea. Bacterial vaginitis. EXAM:  TRANSABDOMINAL AND TRANSVAGINAL ULTRASOUND OF PELVIS DOPPLER ULTRASOUND OF OVARIES TECHNIQUE: Both transabdominal and transvaginal ultrasound examinations of the pelvis were performed. Transabdominal technique was performed for global imaging of the pelvis including uterus, ovaries, adnexal regions, and pelvic cul-de-sac. It was necessary to proceed with endovaginal exam following the transabdominal exam to visualize the uterus, ovaries and pelvic structures in better detail. Color and duplex Doppler ultrasound was utilized to evaluate blood flow to the ovaries. COMPARISON:  Abdomen and pelvis CT obtained yesterday. FINDINGS: Uterus Measurements: 7.4 x 4.7 x 4.0 cm = volume: 73 mL. No fibroids or other mass visualized. Endometrium Thickness: 8.9 mm.  No focal abnormality visualized. Right ovary Measurements: 4.1 x 2.3 x 2.0 cm = volume: 10 mL. Normal appearance/no adnexal mass. Left ovary Measurements: 3.0 x 2.6 x 1.8 cm = volume: 7 mL. Normal appearance/no adnexal mass. Pulsed Doppler evaluation of both ovaries demonstrates normal low-resistance arterial and venous waveforms. Other findings There is an oval, mildly heterogeneous and mildly hypoechoic mass in the central pelvis adjacent to the uterus and left ovary. This is circumscribed and measures 5.2 x 4.2 x 3.3 cm. This contains internal blood flow with color Doppler and is separate  from the uterus and ovaries. Also noted is a small amount of free peritoneal fluid. IMPRESSION: 1. 5.2 cm oval, solid mass in the pelvic cul-de-sac. This appears separate from the uterus and ovaries and has nonspecific features. This could represent a benign or malignant neoplasm with possibilities including sarcoma and neurogenic tumor. 2. Small amount of free peritoneal fluid. 3. Normal appearing uterus and ovaries. Electronically Signed   By: Beckie Salts M.D.   On: 06/19/2018 14:44   Ct Abdomen Pelvis W Contrast  Result Date: 06/19/2018 CLINICAL DATA:  Acute generalized abdominal pain. EXAM: CT ABDOMEN AND PELVIS WITH CONTRAST TECHNIQUE: Multidetector CT imaging of the abdomen and pelvis was performed using the standard protocol following bolus administration of intravenous contrast. CONTRAST:  OMNIPAQUE IOHEXOL 300 MG/ML  SOLN COMPARISON:  None. FINDINGS: Lower chest: No acute abnormality. Hepatobiliary: No focal liver abnormality is seen. No gallstones, gallbladder wall thickening, or biliary dilatation. Pancreas: Unremarkable. No pancreatic ductal dilatation or surrounding inflammatory changes. Spleen: Normal in size without focal abnormality. Adrenals/Urinary Tract: Adrenal glands are unremarkable. Kidneys are normal, without renal calculi, focal lesion, or hydronephrosis. Bladder is unremarkable. Stomach/Bowel: Stomach is within normal limits. Appendix appears normal. No evidence of bowel wall thickening, distention, or inflammatory changes. Vascular/Lymphatic: No significant vascular findings are present. No enlarged abdominal or pelvic lymph nodes. Reproductive: Uterus and left adnexal region are unremarkable. 5.3 x 3.6 cm oval-shaped solid mass or abnormality is noted in the pre rectal space of the pelvis which may represent ovarian or adnexal abnormality. Small amount of free fluid is seen in this area as well. Other: No definite hernia is noted. Musculoskeletal: No acute or significant osseous  findings. IMPRESSION: 5.3 x 3.6 cm oval-shaped solid abnormality or mass is noted in the pre rectal space of the pelvis with mild amount of surrounding free fluid. Potentially this may represent ovarian or adnexal neoplasm, and pelvic ultrasound is recommended for further evaluation. If pelvic ultrasound does not demonstrate this abnormality, further evaluation with MRI would then be recommended. Electronically Signed   By: Lupita Raider M.D.   On: 06/19/2018 12:37   [place] Art/ven Flow Abd Pelv Doppler  Result Date: 06/19/2018 CLINICAL DATA:  Pelvic mass in the presacral space on an abdomen and pelvis CT obtained yesterday. This was concerning  for a possible ovarian neoplasm. She presented with acute generalized abdominal pain with nausea, vomiting and diarrhea. Bacterial vaginitis. EXAM: TRANSABDOMINAL AND TRANSVAGINAL ULTRASOUND OF PELVIS DOPPLER ULTRASOUND OF OVARIES TECHNIQUE: Both transabdominal and transvaginal ultrasound examinations of the pelvis were performed. Transabdominal technique was performed for global imaging of the pelvis including uterus, ovaries, adnexal regions, and pelvic cul-de-sac. It was necessary to proceed with endovaginal exam following the transabdominal exam to visualize the uterus, ovaries and pelvic structures in better detail. Color and duplex Doppler ultrasound was utilized to evaluate blood flow to the ovaries. COMPARISON:  Abdomen and pelvis CT obtained yesterday. FINDINGS: Uterus Measurements: 7.4 x 4.7 x 4.0 cm = volume: 73 mL. No fibroids or other mass visualized. Endometrium Thickness: 8.9 mm.  No focal abnormality visualized. Right ovary Measurements: 4.1 x 2.3 x 2.0 cm = volume: 10 mL. Normal appearance/no adnexal mass. Left ovary Measurements: 3.0 x 2.6 x 1.8 cm = volume: 7 mL. Normal appearance/no adnexal mass. Pulsed Doppler evaluation of both ovaries demonstrates normal low-resistance arterial and venous waveforms. Other findings There is an oval, mildly heterogeneous  and mildly hypoechoic mass in the central pelvis adjacent to the uterus and left ovary. This is circumscribed and measures 5.2 x 4.2 x 3.3 cm. This contains internal blood flow with color Doppler and is separate from the uterus and ovaries. Also noted is a small amount of free peritoneal fluid. IMPRESSION: 1. 5.2 cm oval, solid mass in the pelvic cul-de-sac. This appears separate from the uterus and ovaries and has nonspecific features. This could represent a benign or malignant neoplasm with possibilities including sarcoma and neurogenic tumor. 2. Small amount of free peritoneal fluid. 3. Normal appearing uterus and ovaries. Electronically Signed   By: Beckie Salts M.D.   On: 06/19/2018 14:44    Procedures Procedures (including critical care time)  Medications Ordered in ED Medications  sodium chloride 0.9 % bolus 1,000 mL (has no administration in time range)  morphine 4 MG/ML injection 4 mg (has no administration in time range)  ondansetron (ZOFRAN) injection 4 mg (has no administration in time range)     Initial Impression / Assessment and Plan / ED Course  I have reviewed the triage vital signs and the nursing notes.  Pertinent labs & imaging results that were available during my care of the patient were reviewed by me and considered in my medical decision making (see chart for details).        Patient with nausea and vomiting.  Seen once earlier today, and once yesterday.  Had CT imaging and ultrasound, which showed adnexal mass, concerning for possible neoplasm.  These results were discussed with the patient.  She will follow-up with OB/GYN.  I do not feel that these findings are the cause of the patient's symptoms today.  In the emergency department tonight, she was given 2 L of fluid and pain and nausea medicine.  She is able to tolerate some sips of ginger ale and was able to take p.o. medication.  We did discuss admission, but at this time, feel that patient may be better served  with trying additional medications at home rather than being admitted to the hospital during the coronavirus pandemic.  She agrees with this plan.  Discussed with patient that she will likely continue to have symptoms for the next 24 to 48 hours, but if her symptoms change or worsen in any way she should come back to the emergency department.  She understands and agrees this plan.  She is stable ready for discharge.  Final Clinical Impressions(s) / ED Diagnoses   Final diagnoses:  Non-intractable vomiting with nausea, unspecified vomiting type    ED Discharge Orders         Ordered    promethazine (PHENERGAN) 25 MG suppository  Every 6 hours PRN     06/20/18 0454    HYDROcodone-acetaminophen (NORCO/VICODIN) 5-325 MG tablet  Every 6 hours PRN     06/20/18 0454           Roxy Horseman, PA-C 06/20/18 0457    Cathren Laine, MD 06/20/18 1241

## 2018-06-20 ENCOUNTER — Encounter (HOSPITAL_COMMUNITY): Payer: Self-pay

## 2018-06-20 ENCOUNTER — Inpatient Hospital Stay (HOSPITAL_COMMUNITY)
Admission: EM | Admit: 2018-06-20 | Discharge: 2018-06-22 | DRG: 392 | Disposition: A | Discharge status: Home / Self Care | Payer: Medicaid - Out of State | Attending: Internal Medicine | Admitting: Internal Medicine

## 2018-06-20 ENCOUNTER — Observation Stay (HOSPITAL_COMMUNITY): Payer: Medicaid - Out of State

## 2018-06-20 ENCOUNTER — Other Ambulatory Visit: Payer: Self-pay

## 2018-06-20 DIAGNOSIS — E876 Hypokalemia: Secondary | ICD-10-CM | POA: Diagnosis present

## 2018-06-20 DIAGNOSIS — R109 Unspecified abdominal pain: Secondary | ICD-10-CM | POA: Diagnosis present

## 2018-06-20 DIAGNOSIS — R1900 Intra-abdominal and pelvic swelling, mass and lump, unspecified site: Secondary | ICD-10-CM | POA: Diagnosis present

## 2018-06-20 DIAGNOSIS — R112 Nausea with vomiting, unspecified: Principal | ICD-10-CM | POA: Diagnosis present

## 2018-06-20 DIAGNOSIS — R1110 Vomiting, unspecified: Secondary | ICD-10-CM

## 2018-06-20 DIAGNOSIS — Z87891 Personal history of nicotine dependence: Secondary | ICD-10-CM

## 2018-06-20 DIAGNOSIS — R0789 Other chest pain: Secondary | ICD-10-CM | POA: Diagnosis present

## 2018-06-20 DIAGNOSIS — R079 Chest pain, unspecified: Secondary | ICD-10-CM | POA: Diagnosis present

## 2018-06-20 DIAGNOSIS — J45902 Unspecified asthma with status asthmaticus: Secondary | ICD-10-CM

## 2018-06-20 DIAGNOSIS — J45909 Unspecified asthma, uncomplicated: Secondary | ICD-10-CM | POA: Diagnosis present

## 2018-06-20 LAB — COMPREHENSIVE METABOLIC PANEL
ALT: 17 U/L (ref 0–44)
ALT: 17 U/L (ref 0–44)
AST: 20 U/L (ref 15–41)
AST: 25 U/L (ref 15–41)
Albumin: 4 g/dL (ref 3.5–5.0)
Albumin: 3.6 g/dL (ref 3.5–5.0)
Alkaline Phosphatase: 60 U/L (ref 38–126)
Alkaline Phosphatase: 53 U/L (ref 38–126)
Anion gap: 12 (ref 5–15)
Anion gap: 12 (ref 5–15)
BUN: <5 mg/dL — ABNORMAL LOW (ref 6–20)
BUN: 7 mg/dL (ref 6–20)
CO2: 24 mmol/L (ref 22–32)
CO2: 28 mmol/L (ref 22–32)
Calcium: 8.7 mg/dL — ABNORMAL LOW (ref 8.9–10.3)
Calcium: 8 mg/dL — ABNORMAL LOW (ref 8.9–10.3)
Chloride: 101 mmol/L (ref 98–111)
Chloride: 99 mmol/L (ref 98–111)
Creatinine, Ser: 0.87 mg/dL (ref 0.44–1.00)
Creatinine, Ser: 0.99 mg/dL (ref 0.44–1.00)
GFR calc Af Amer: >60 mL/min (ref >60)
GFR calc Af Amer: >60 mL/min (ref >60)
GFR calc non Af Amer: >60 mL/min (ref >60)
GFR calc non Af Amer: >60 mL/min (ref >60)
Glucose, Bld: 100 mg/dL — ABNORMAL HIGH (ref 70–99)
Glucose, Bld: 88 mg/dL (ref 70–99)
Potassium: 2.9 mmol/L — ABNORMAL LOW (ref 3.5–5.1)
Potassium: 3.2 mmol/L — ABNORMAL LOW (ref 3.5–5.1)
Sodium: 137 mmol/L (ref 135–145)
Sodium: 139 mmol/L (ref 135–145)
Total Bilirubin: 0.5 mg/dL (ref 0.3–1.2)
Total Bilirubin: 1.4 mg/dL — ABNORMAL HIGH (ref 0.3–1.2)
Total Protein: 6.8 g/dL (ref 6.5–8.1)
Total Protein: 6.1 g/dL — ABNORMAL LOW (ref 6.5–8.1)

## 2018-06-20 LAB — LIPASE, BLOOD: Lipase: 30 U/L (ref 11–51)

## 2018-06-20 LAB — URINALYSIS, ROUTINE W REFLEX MICROSCOPIC
Bacteria, UA: NONE SEEN
Bilirubin Urine: NEGATIVE
Glucose, UA: NEGATIVE mg/dL
Ketones, ur: 20 mg/dL — AB
Nitrite: NEGATIVE
Protein, ur: NEGATIVE mg/dL
Specific Gravity, Urine: 1.005 (ref 1.005–1.030)
pH: 8 (ref 5.0–8.0)

## 2018-06-20 LAB — CBC
HCT: 41 % (ref 36.0–46.0)
Hemoglobin: 13.6 g/dL (ref 12.0–15.0)
MCH: 31.7 pg (ref 26.0–34.0)
MCHC: 33.2 g/dL (ref 30.0–36.0)
MCV: 95.6 fL (ref 80.0–100.0)
Platelets: 191 10*3/uL (ref 150–400)
RBC: 4.29 MIL/uL (ref 3.87–5.11)
RDW: 12 % (ref 11.5–15.5)
WBC: 7.3 10*3/uL (ref 4.0–10.5)
nRBC: 0 % (ref 0.0–0.2)

## 2018-06-20 LAB — POC URINE PREG, ED: Preg Test, Ur: NEGATIVE

## 2018-06-20 LAB — TROPONIN I: Troponin I: <0.03 ng/mL (ref <0.03)

## 2018-06-20 MED ORDER — ACETAMINOPHEN 325 MG PO TABS: 650.0000 mg | ORAL_TABLET | Freq: Four times a day (QID) | ORAL | Status: DC | PRN

## 2018-06-20 MED ORDER — PANTOPRAZOLE SODIUM 40 MG IV SOLR
40.0000 mg | Freq: Once | INTRAVENOUS | Status: AC
  Administered 2018-06-20: 40 mg via INTRAVENOUS
  Filled 2018-06-20: qty 40

## 2018-06-20 MED ORDER — MORPHINE SULFATE (PF) 2 MG/ML IV SOLN
1.0000 mg | INTRAVENOUS | Status: DC | PRN
  Administered 2018-06-20: 2 mg via INTRAVENOUS
  Administered 2018-06-21 (×2): 3 mg via INTRAVENOUS
  Administered 2018-06-21 (×3): 2 mg via INTRAVENOUS
  Filled 2018-06-20 (×3): qty 1
  Filled 2018-06-20: qty 2
  Filled 2018-06-20: qty 1
  Filled 2018-06-20: qty 2

## 2018-06-20 MED ORDER — ONDANSETRON HCL 4 MG/2ML IJ SOLN
4.0000 mg | Freq: Once | INTRAMUSCULAR | Status: AC
  Administered 2018-06-20: 4 mg via INTRAVENOUS
  Filled 2018-06-20: qty 2

## 2018-06-20 MED ORDER — POTASSIUM CHLORIDE IN NACL 20-0.9 MEQ/L-% IV SOLN
INTRAVENOUS | Status: DC
  Administered 2018-06-21: 1000 mL via INTRAVENOUS
  Administered 2018-06-21: 01:00:00 via INTRAVENOUS
  Filled 2018-06-20 (×2): qty 1000

## 2018-06-20 MED ORDER — SODIUM CHLORIDE 0.9 % IV BOLUS
1000.0000 mL | Freq: Once | INTRAVENOUS | Status: AC
  Administered 2018-06-20: 1000 mL via INTRAVENOUS

## 2018-06-20 MED ORDER — ONDANSETRON HCL 4 MG/2ML IJ SOLN: 4.0000 mg | Freq: Four times a day (QID) | INTRAMUSCULAR | Status: DC | PRN

## 2018-06-20 MED ORDER — PROMETHAZINE HCL 25 MG/ML IJ SOLN
12.5000 mg | Freq: Four times a day (QID) | INTRAMUSCULAR | Status: DC | PRN
  Administered 2018-06-20 – 2018-06-21 (×4): 12.5 mg via INTRAVENOUS
  Filled 2018-06-20 (×4): qty 1

## 2018-06-20 MED ORDER — ONDANSETRON HCL 4 MG PO TABS: 4.0000 mg | ORAL_TABLET | Freq: Four times a day (QID) | ORAL | Status: DC | PRN

## 2018-06-20 MED ORDER — SODIUM CHLORIDE 0.9% FLUSH
3.0000 mL | Freq: Two times a day (BID) | INTRAVENOUS | Status: DC
  Administered 2018-06-20 – 2018-06-21 (×2): 3 mL via INTRAVENOUS

## 2018-06-20 MED ORDER — PROMETHAZINE HCL 25 MG RE SUPP: 25.0000 mg | Freq: Four times a day (QID) | RECTAL | 0 refills | Status: DC | PRN

## 2018-06-20 MED ORDER — DROPERIDOL 2.5 MG/ML IJ SOLN
2.5000 mg | Freq: Once | INTRAMUSCULAR | Status: AC
  Administered 2018-06-20: 2.5 mg via INTRAVENOUS
  Filled 2018-06-20: qty 2

## 2018-06-20 MED ORDER — HYDROCODONE-ACETAMINOPHEN 5-325 MG PO TABS: 1.0000 | ORAL_TABLET | Freq: Four times a day (QID) | ORAL | 0 refills | Status: DC | PRN

## 2018-06-20 MED ORDER — ACETAMINOPHEN 650 MG RE SUPP: 650.0000 mg | Freq: Four times a day (QID) | RECTAL | Status: DC | PRN

## 2018-06-20 MED ORDER — MORPHINE SULFATE (PF) 2 MG/ML IV SOLN
2.0000 mg | Freq: Once | INTRAVENOUS | Status: AC
  Administered 2018-06-20: 19:00:00 2 mg via INTRAVENOUS
  Filled 2018-06-20: qty 1

## 2018-06-20 MED ORDER — CAPSAICIN 0.025 % EX CREA
TOPICAL_CREAM | Freq: Once | CUTANEOUS | Status: AC
  Administered 2018-06-20: 19:00:00 via TOPICAL
  Filled 2018-06-20: qty 60

## 2018-06-20 MED ORDER — PROMETHAZINE HCL 25 MG/ML IJ SOLN
25.0000 mg | Freq: Once | INTRAMUSCULAR | Status: AC
  Administered 2018-06-20: 25 mg via INTRAVENOUS
  Filled 2018-06-20: qty 1

## 2018-06-20 MED ORDER — CAPSAICIN 0.075 % EX CREA
TOPICAL_CREAM | Freq: Once | CUTANEOUS | Status: DC
  Filled 2018-06-20: qty 60

## 2018-06-20 MED ORDER — ONDANSETRON HCL 4 MG/2ML IJ SOLN
4.0000 mg | Freq: Once | INTRAMUSCULAR | Status: AC | PRN
  Administered 2018-06-20: 4 mg via INTRAVENOUS
  Filled 2018-06-20: qty 2

## 2018-06-20 MED ORDER — PANTOPRAZOLE SODIUM 40 MG IV SOLR
40.0000 mg | Freq: Two times a day (BID) | INTRAVENOUS | Status: DC
  Administered 2018-06-21 – 2018-06-22 (×3): 40 mg via INTRAVENOUS
  Filled 2018-06-20 (×3): qty 40

## 2018-06-20 MED ORDER — MAGNESIUM SULFATE IN D5W 1-5 GM/100ML-% IV SOLN
1.0000 g | Freq: Once | INTRAVENOUS | Status: AC
  Administered 2018-06-20: 1 g via INTRAVENOUS
  Filled 2018-06-20: qty 100

## 2018-06-20 MED ORDER — POTASSIUM CHLORIDE 10 MEQ/100ML IV SOLN
10.0000 meq | INTRAVENOUS | Status: AC
  Administered 2018-06-20 (×2): 10 meq via INTRAVENOUS
  Filled 2018-06-20 (×2): qty 100

## 2018-06-20 MED ORDER — ONDANSETRON HCL 4 MG/2ML IJ SOLN
4.0000 mg | Freq: Four times a day (QID) | INTRAMUSCULAR | Status: DC | PRN
  Administered 2018-06-21 (×3): 4 mg via INTRAVENOUS
  Filled 2018-06-20 (×3): qty 2

## 2018-06-20 MED ORDER — HYDROCODONE-ACETAMINOPHEN 5-325 MG PO TABS
2.0000 | ORAL_TABLET | Freq: Once | ORAL | Status: AC
  Administered 2018-06-20: 2 via ORAL
  Filled 2018-06-20: qty 2

## 2018-06-20 MED ORDER — KETOROLAC TROMETHAMINE 30 MG/ML IJ SOLN
30.0000 mg | Freq: Once | INTRAMUSCULAR | Status: AC
  Administered 2018-06-20: 30 mg via INTRAVENOUS
  Filled 2018-06-20: qty 1

## 2018-06-20 MED ORDER — FENTANYL CITRATE (PF) 100 MCG/2ML IJ SOLN
50.0000 ug | Freq: Once | INTRAMUSCULAR | Status: AC | PRN
  Administered 2018-06-20: 50 ug via INTRAVENOUS
  Filled 2018-06-20: qty 2

## 2018-06-20 MED ORDER — ALBUTEROL SULFATE (2.5 MG/3ML) 0.083% IN NEBU
2.5000 mg | INHALATION_SOLUTION | Freq: Four times a day (QID) | RESPIRATORY_TRACT | Status: DC | PRN
  Administered 2018-06-21: 2.5 mg via RESPIRATORY_TRACT
  Filled 2018-06-20: qty 3

## 2018-06-20 NOTE — ED Notes (Signed)
Report called to Candace, RN 

## 2018-06-20 NOTE — ED Notes (Addendum)
Note made in error

## 2018-06-20 NOTE — H&P (Signed)
History and Physical    Krista Stout UVO:[id number] DOB: 22-Jun-1990 DOA: 06/20/2018  PCP: System, Pcp Not In   Patient coming from: Home   Chief Complaint: Abdominal pain, N/V, chest pain, low-grade fever  HPI: Krista Stout is a 28 y.o. female with medical history significant for asthma, now presenting to the emergency department for evaluation of abdominal pain, nausea, vomiting, chest pain.  Patient reports that she been in her usual state of health until approximately 4 days ago when she developed subjective fevers, nausea, vomiting, and nonbloody diarrhea.  She has been seen in the emergency department multiple times over the past couple days for this, was found to have a pelvic mass that will need further work-up, had stable vitals and reassuringly went home with Norco and antiemetics, but the symptoms have persisted and she has been unable to tolerate anything by mouth.  The diarrhea seems to have resolved over the past couple days and the patient is no longer having chills, but generalized abdominal pain persists, she continues to have nausea and vomits with small streaks of red blood, and has been unable to eat or drink.  She woke with chest discomfort this morning that she thought could be related to her asthma, but she was not having any wheezing or cough and symptoms did not improve with her albuterol inhaler.  Chest pain is mild, persisting, worse with vomiting.    ED Course: Upon arrival to the ED, patient is found to be afebrile, saturating well on room air, and with vitals otherwise stable.  EKG features a sinus rhythm.  Chemistry panel is notable for potassium of 3.2 and total bilirubin 1.4.  CBC is unremarkable.  Troponin is undetectable, pregnancy test is negative, and lipase was normal yesterday.  Urinalysis notable for ketonuria microscopic hematuria.  Patient was given a liter of normal saline, 20 mEq IV potassium, fentanyl, morphine, acetaminophen, Zofran, and even droperidol  in the ED.  She remains hemodynamically stable, is not in any apparent respiratory distress, but continues to have severe nausea, vomiting with small streaks of blood, unable to eat or drink, and will be observed for ongoing evaluation and management.  Review of Systems:  All other systems reviewed and apart from HPI, are negative.  Past Medical History:  Diagnosis Date   Asthma    Bacterial vaginitis     Past Surgical History:  Procedure Laterality Date   HERNIA REPAIR       reports that she has quit smoking. She has never used smokeless tobacco. She reports that she does not drink alcohol or use drugs.  No Known Allergies  History reviewed. No pertinent family history.   Prior to Admission medications   Medication Sig Start Date End Date Taking? Authorizing Provider  dicyclomine (BENTYL) 20 MG tablet Take 1 tablet (20 mg total) by mouth 2 (two) times daily. 06/19/18   Margarita Mail, PA-C  HYDROcodone-acetaminophen (NORCO/VICODIN) 5-325 MG tablet Take 1-2 tablets by mouth every 6 (six) hours as needed. 06/20/18   Montine Circle, PA-C  ondansetron (ZOFRAN ODT) 4 MG disintegrating tablet Take 1 tablet (4 mg total) by mouth every 8 (eight) hours as needed for nausea or vomiting. 06/18/18   Recardo Evangelist, PA-C  promethazine (PHENERGAN) 25 MG suppository Place 1 suppository (25 mg total) rectally every 6 (six) hours as needed for nausea or vomiting. 06/20/18   Montine Circle, PA-C  traMADol (ULTRAM) 50 MG tablet Take 1 tablet (50 mg total) by mouth every 6 (six)  hours as needed. 06/19/18   Arthor CaptainHarris, Abigail, PA-C    Physical Exam: Vitals:   06/20/18 1815 06/20/18 1830 06/20/18 1845 06/20/18 2015  BP: (!) 157/100 (!) 144/90 (!) 163/90 (!) 145/94  Pulse: 83 65 66 61  Resp:  16 (!) 22 (!) 22  Temp:      TempSrc:      SpO2: 95% 100% 99% 99%    Constitutional: NAD, appears uncomfortable   Eyes: PERTLA, lids and conjunctivae normal ENMT: Mucous membranes are moist. Posterior  pharynx clear of any exudate or lesions.   Neck: normal, supple, no masses, no thyromegaly Respiratory: clear to auscultation bilaterally, no wheezing, no crackles. No accessory muscle use.  Cardiovascular: S1 & S2 heard, regular rate and rhythm. No extremity edema.   Abdomen: No distension, soft, generalized tenderness without rebound pain or guarding. Bowel sounds active.  Musculoskeletal: no clubbing / cyanosis. No joint deformity upper and lower extremities.    Skin: no significant rashes, lesions, ulcers. Warm, dry, well-perfused. Neurologic: CN 2-12 grossly intact. Sensation intact. Strength 5/5 in all 4 limbs.  Psychiatric:  Alert and oriented x 3. Pleasant, cooperative.    Labs on Admission: I have personally reviewed following labs and imaging studies  CBC: Recent Labs  Lab 06/18/18 1012 06/19/18 0932 06/19/18 2242 06/20/18 1958  WBC 6.0 9.6 7.4 7.3  NEUTROABS  --  6.8 4.7  --   HGB 14.3 13.5 13.7 13.6  HCT 42.9 39.6 40.7 41.0  MCV 95.5 93.6 93.8 95.6  PLT 241 235 236 191   Basic Metabolic Panel: Recent Labs  Lab 06/18/18 1012 06/19/18 0932 06/19/18 2242 06/20/18 1543  NA 139 137 137 139  K 3.6 3.2* 2.9* 3.2*  CL 104 104 101 99  CO2 25 22 24 28   GLUCOSE 137* 108* 100* 88  BUN 5* <5* <5* 7  CREATININE 0.90 0.82 0.87 0.99  CALCIUM 9.4 8.9 8.7* 8.0*   GFR: Estimated Creatinine Clearance: 73.2 mL/min (by C-G formula based on SCr of 0.99 mg/dL). Liver Function Tests: Recent Labs  Lab 06/18/18 1012 06/19/18 0932 06/19/18 2242 06/20/18 1543  AST 19 18 20 25   ALT 18 19 17 17   ALKPHOS 73 65 60 53  BILITOT 0.7 0.6 0.5 1.4*  PROT 7.6 7.1 6.8 6.1*  ALBUMIN 4.5 4.1 4.0 3.6   Recent Labs  Lab 06/18/18 1012 06/19/18 0932 06/19/18 2242  LIPASE 21 27 30    No results for input(s): AMMONIA in the last 168 hours. Coagulation Profile: No results for input(s): INR, PROTIME in the last 168 hours. Cardiac Enzymes: Recent Labs  Lab 06/20/18 1543  TROPONINI  <0.03   BNP (last 3 results) No results for input(s): PROBNP in the last 8760 hours. HbA1C: No results for input(s): HGBA1C in the last 72 hours. CBG: No results for input(s): GLUCAP in the last 168 hours. Lipid Profile: No results for input(s): CHOL, HDL, LDLCALC, TRIG, CHOLHDL, LDLDIRECT in the last 72 hours. Thyroid Function Tests: No results for input(s): TSH, T4TOTAL, FREET4, T3FREE, THYROIDAB in the last 72 hours. Anemia Panel: No results for input(s): VITAMINB12, FOLATE, FERRITIN, TIBC, IRON, RETICCTPCT in the last 72 hours. Urine analysis:    Component Value Date/Time   COLORURINE STRAW (A) 06/20/2018 1616   APPEARANCEUR CLEAR 06/20/2018 1616   LABSPEC 1.005 06/20/2018 1616   PHURINE 8.0 06/20/2018 1616   GLUCOSEU NEGATIVE 06/20/2018 1616   HGBUR SMALL (A) 06/20/2018 1616   BILIRUBINUR NEGATIVE 06/20/2018 1616   KETONESUR 20 (A) 06/20/2018 1616  PROTEINUR NEGATIVE 06/20/2018 1616   UROBILINOGEN 0.2 11/07/2014 1148   NITRITE NEGATIVE 06/20/2018 1616   LEUKOCYTESUR TRACE (A) 06/20/2018 1616   Sepsis Labs: @LABRCNTIP (procalcitonin:4,lacticidven:4) ) Recent Results (from the past 240 hour(s))  SARS Coronavirus 2 (CEPHEID - Performed in [facility] hospital lab), Hosp Order     Status: None   Collection Time: 06/19/18 10:42 PM  Result Value Ref Range Status   SARS Coronavirus 2 NEGATIVE NEGATIVE Final    Comment: (NOTE) If result is NEGATIVE SARS-CoV-2 target nucleic acids are NOT DETECTED. The SARS-CoV-2 RNA is generally detectable in upper and lower  respiratory specimens during the acute phase of infection. The lowest  concentration of SARS-CoV-2 viral copies this assay can detect is 250  copies / mL. A negative result does not preclude SARS-CoV-2 infection  and should not be used as the sole basis for treatment or other  patient management decisions.  A negative result may occur with  improper specimen collection / handling, submission of specimen other  than  nasopharyngeal swab, presence of viral mutation(s) within the  areas targeted by this assay, and inadequate number of viral copies  (<250 copies / mL). A negative result must be combined with clinical  observations, patient history, and epidemiological information. If result is POSITIVE SARS-CoV-2 target nucleic acids are DETECTED. The SARS-CoV-2 RNA is generally detectable in upper and lower  respiratory specimens dur ing the acute phase of infection.  Positive  results are indicative of active infection with SARS-CoV-2.  Clinical  correlation with patient history and other diagnostic information is  necessary to determine patient infection status.  Positive results do  not rule out bacterial infection or co-infection with other viruses. If result is PRESUMPTIVE POSTIVE SARS-CoV-2 nucleic acids MAY BE PRESENT.   A presumptive positive result was obtained on the submitted specimen  and confirmed on repeat testing.  While 2019 novel coronavirus  (SARS-CoV-2) nucleic acids may be present in the submitted sample  additional confirmatory testing may be necessary for epidemiological  and / or clinical management purposes  to differentiate between  SARS-CoV-2 and other Sarbecovirus currently known to infect humans.  If clinically indicated additional testing with an alternate test  methodology [phone]([id number]) is advised. The SARS-CoV-2 RNA is generally  detectable in upper and lower respiratory sp ecimens during the acute  phase of infection. The expected result is Negative. Fact Sheet for Patients:  BoilerBrush.com.cyhttps://www.fda.gov/media/136312/download Fact Sheet for Healthcare Providers: https://pope.com/https://www.fda.gov/media/136313/download This test is not yet approved or cleared by the [place] FDA and has been authorized for detection and/or diagnosis of SARS-CoV-2 by FDA under an Emergency Use Authorization (EUA).  This EUA will remain in effect (meaning this test can be used) for the duration of  the COVID-19 declaration under Section 564(b)(1) of the Act, 21 U.S.C. section 360bbb-3(b)(1), unless the authorization is terminated or revoked sooner. Performed at [facility], [address] [place], [place] [address]      Radiological Exams on Admission: [place] Transvaginal Non-ob  Result Date: 06/19/2018 CLINICAL DATA:  Pelvic mass in the presacral space on an abdomen and pelvis CT obtained yesterday. This was concerning for a possible ovarian neoplasm. She presented with acute generalized abdominal pain with nausea, vomiting and diarrhea. Bacterial vaginitis. EXAM: TRANSABDOMINAL AND TRANSVAGINAL ULTRASOUND OF PELVIS DOPPLER ULTRASOUND OF OVARIES TECHNIQUE: Both transabdominal and transvaginal ultrasound examinations of the pelvis were performed. Transabdominal technique was performed for global imaging of the pelvis including uterus, ovaries, adnexal regions, and pelvic cul-de-sac. It was  necessary to proceed with endovaginal exam following the transabdominal exam to visualize the uterus, ovaries and pelvic structures in better detail. Color and duplex Doppler ultrasound was utilized to evaluate blood flow to the ovaries. COMPARISON:  Abdomen and pelvis CT obtained yesterday. FINDINGS: Uterus Measurements: 7.4 x 4.7 x 4.0 cm = volume: 73 mL. No fibroids or other mass visualized. Endometrium Thickness: 8.9 mm.  No focal abnormality visualized. Right ovary Measurements: 4.1 x 2.3 x 2.0 cm = volume: 10 mL. Normal appearance/no adnexal mass. Left ovary Measurements: 3.0 x 2.6 x 1.8 cm = volume: 7 mL. Normal appearance/no adnexal mass. Pulsed Doppler evaluation of both ovaries demonstrates normal low-resistance arterial and venous waveforms. Other findings There is an oval, mildly heterogeneous and mildly hypoechoic mass in the central pelvis adjacent to the uterus and left ovary. This is circumscribed and measures 5.2 x 4.2 x 3.3 cm. This contains internal blood flow with color Doppler and is  separate from the uterus and ovaries. Also noted is a small amount of free peritoneal fluid. IMPRESSION: 1. 5.2 cm oval, solid mass in the pelvic cul-de-sac. This appears separate from the uterus and ovaries and has nonspecific features. This could represent a benign or malignant neoplasm with possibilities including sarcoma and neurogenic tumor. 2. Small amount of free peritoneal fluid. 3. Normal appearing uterus and ovaries. Electronically Signed   By: Beckie Salts M.D.   On: 06/19/2018 14:44   US Pelvis Complete  Result Date: 06/19/2018 CLINICAL DATA:  Pelvic mass in the presacral space on an abdomen and pelvis CT obtained yesterday. This was concerning for a possible ovarian neoplasm. She presented with acute generalized abdominal pain with nausea, vomiting and diarrhea. Bacterial vaginitis. EXAM: TRANSABDOMINAL AND TRANSVAGINAL ULTRASOUND OF PELVIS DOPPLER ULTRASOUND OF OVARIES TECHNIQUE: Both transabdominal and transvaginal ultrasound examinations of the pelvis were performed. Transabdominal technique was performed for global imaging of the pelvis including uterus, ovaries, adnexal regions, and pelvic cul-de-sac. It was necessary to proceed with endovaginal exam following the transabdominal exam to visualize the uterus, ovaries and pelvic structures in better detail. Color and duplex Doppler ultrasound was utilized to evaluate blood flow to the ovaries. COMPARISON:  Abdomen and pelvis CT obtained yesterday. FINDINGS: Uterus Measurements: 7.4 x 4.7 x 4.0 cm = volume: 73 mL. No fibroids or other mass visualized. Endometrium Thickness: 8.9 mm.  No focal abnormality visualized. Right ovary Measurements: 4.1 x 2.3 x 2.0 cm = volume: 10 mL. Normal appearance/no adnexal mass. Left ovary Measurements: 3.0 x 2.6 x 1.8 cm = volume: 7 mL. Normal appearance/no adnexal mass. Pulsed Doppler evaluation of both ovaries demonstrates normal low-resistance arterial and venous waveforms. Other findings There is an oval, mildly  heterogeneous and mildly hypoechoic mass in the central pelvis adjacent to the uterus and left ovary. This is circumscribed and measures 5.2 x 4.2 x 3.3 cm. This contains internal blood flow with color Doppler and is separate from the uterus and ovaries. Also noted is a small amount of free peritoneal fluid. IMPRESSION: 1. 5.2 cm oval, solid mass in the pelvic cul-de-sac. This appears separate from the uterus and ovaries and has nonspecific features. This could represent a benign or malignant neoplasm with possibilities including sarcoma and neurogenic tumor. 2. Small amount of free peritoneal fluid. 3. Normal appearing uterus and ovaries. Electronically Signed   By: Beckie Salts M.D.   On: 06/19/2018 14:44   Ct Abdomen Pelvis W Contrast  Result Date: 06/19/2018 CLINICAL DATA:  Acute generalized abdominal pain. EXAM: CT ABDOMEN  AND PELVIS WITH CONTRAST TECHNIQUE: Multidetector CT imaging of the abdomen and pelvis was performed using the standard protocol following bolus administration of intravenous contrast. CONTRAST:  OMNIPAQUE IOHEXOL 300 MG/ML  SOLN COMPARISON:  None. FINDINGS: Lower chest: No acute abnormality. Hepatobiliary: No focal liver abnormality is seen. No gallstones, gallbladder wall thickening, or biliary dilatation. Pancreas: Unremarkable. No pancreatic ductal dilatation or surrounding inflammatory changes. Spleen: Normal in size without focal abnormality. Adrenals/Urinary Tract: Adrenal glands are unremarkable. Kidneys are normal, without renal calculi, focal lesion, or hydronephrosis. Bladder is unremarkable. Stomach/Bowel: Stomach is within normal limits. Appendix appears normal. No evidence of bowel wall thickening, distention, or inflammatory changes. Vascular/Lymphatic: No significant vascular findings are present. No enlarged abdominal or pelvic lymph nodes. Reproductive: Uterus and left adnexal region are unremarkable. 5.3 x 3.6 cm oval-shaped solid mass or abnormality is noted in  the pre rectal space of the pelvis which may represent ovarian or adnexal abnormality. Small amount of free fluid is seen in this area as well. Other: No definite hernia is noted. Musculoskeletal: No acute or significant osseous findings. IMPRESSION: 5.3 x 3.6 cm oval-shaped solid abnormality or mass is noted in the pre rectal space of the pelvis with mild amount of surrounding free fluid. Potentially this may represent ovarian or adnexal neoplasm, and pelvic ultrasound is recommended for further evaluation. If pelvic ultrasound does not demonstrate this abnormality, further evaluation with MRI would then be recommended. Electronically Signed   By: Lupita Raider M.D.   On: 06/19/2018 12:37   [place] Art/ven Flow Abd Pelv Doppler  Result Date: 06/19/2018 CLINICAL DATA:  Pelvic mass in the presacral space on an abdomen and pelvis CT obtained yesterday. This was concerning for a possible ovarian neoplasm. She presented with acute generalized abdominal pain with nausea, vomiting and diarrhea. Bacterial vaginitis. EXAM: TRANSABDOMINAL AND TRANSVAGINAL ULTRASOUND OF PELVIS DOPPLER ULTRASOUND OF OVARIES TECHNIQUE: Both transabdominal and transvaginal ultrasound examinations of the pelvis were performed. Transabdominal technique was performed for global imaging of the pelvis including uterus, ovaries, adnexal regions, and pelvic cul-de-sac. It was necessary to proceed with endovaginal exam following the transabdominal exam to visualize the uterus, ovaries and pelvic structures in better detail. Color and duplex Doppler ultrasound was utilized to evaluate blood flow to the ovaries. COMPARISON:  Abdomen and pelvis CT obtained yesterday. FINDINGS: Uterus Measurements: 7.4 x 4.7 x 4.0 cm = volume: 73 mL. No fibroids or other mass visualized. Endometrium Thickness: 8.9 mm.  No focal abnormality visualized. Right ovary Measurements: 4.1 x 2.3 x 2.0 cm = volume: 10 mL. Normal appearance/no adnexal mass. Left ovary Measurements:  3.0 x 2.6 x 1.8 cm = volume: 7 mL. Normal appearance/no adnexal mass. Pulsed Doppler evaluation of both ovaries demonstrates normal low-resistance arterial and venous waveforms. Other findings There is an oval, mildly heterogeneous and mildly hypoechoic mass in the central pelvis adjacent to the uterus and left ovary. This is circumscribed and measures 5.2 x 4.2 x 3.3 cm. This contains internal blood flow with color Doppler and is separate from the uterus and ovaries. Also noted is a small amount of free peritoneal fluid. IMPRESSION: 1. 5.2 cm oval, solid mass in the pelvic cul-de-sac. This appears separate from the uterus and ovaries and has nonspecific features. This could represent a benign or malignant neoplasm with possibilities including sarcoma and neurogenic tumor. 2. Small amount of free peritoneal fluid. 3. Normal appearing uterus and ovaries. Electronically Signed   By: Beckie Salts M.D.   On: 06/19/2018 14:44  EKG: Independently reviewed. Sinus rhythm, QTc 459 ms.   Assessment/Plan   1. Abdominal pain with N/V  - Presents with 4 days of abdominal pain with N/V and subjective fever/chills  - She had diarrhea early in the course but has resolved  - CT (6/5) with adnexal mass as discussed below  - Pregnancy test negative - Abd is soft on exam with active bowel sounds and no peritoneal signs   - Possible viral syndrome or related to mass on CT, THC noted on UDS but she was reportedly febrile  - Treated in ED with IVF, Zofran, pain-control, droperidol, but continues to complain of nausea and vomiting  - Continue antiemetics, IVF hydration, monitor electrolytes, advance diet as her condition improves   2. Chest pain - Reports 1 day of chest pain not suggestive of ACS and with reassuring EKG and troponin, and likely secondary to her intractable N/V  - There is scant blood in vomitus, but pain is mild and Boerhaave's unlikely but could be Mallor-Weiss tear - Check CXR, start PPI, continue  cardiac monitoring   3. Hypokalemia  - Serum potassium is 3.2, secondary to GI-losses  - Given 20 mEq IV potassium and 1 g mag, KCl added to IVF given ongoing vomiting  - Repeat chemistries in am   3. Pelvic mass  - Noted on CT and US in ED on 06/19/18, could be benign or malignant   - She was referred to Gyn onc and will get MRI pelvis outpatient to further characterize   4. Asthma  - No cough, wheeze, or SOB on admsision - Continue as-needed albuterol     PPE: Mask, face shield. Patient wearing mask.  DVT prophylaxis: SCD's  Code Status: Full  Family Communication: Discussed with patient  Consults called: None Admission status: Observation     Briscoe Deutscherimothy S Devony Mcgrady, MD [facility] Pager [phone]  If 7PM-7AM, please contact night-coverage [email or web address] Password [email or web address]  06/20/2018, 9:58 PM

## 2018-06-20 NOTE — ED Notes (Signed)
ED Provider at bedside. 

## 2018-06-20 NOTE — ED Notes (Signed)
Patient verbalized understanding of dc instructions, vss, ambulatory with nad.   

## 2018-06-20 NOTE — ED Provider Notes (Signed)
[facility] EMERGENCY DEPARTMENT Provider Note   CSN: [id number] Arrival date & time: 06/20/18  1410  History   Chief Complaint Chief Complaint  Patient presents with   Chest Pain    HPI Krista Stout is a 28 y.o. female with h/o asthma here for N/V, chest pain, seen in ED twice yesterday and once the day before for same. Reports the past few days she has had abdominal pain, nonbloody diarrhea, chills, nausea, bloody vomiting. She hasn't been able to keep much down. States since this morning when she woke up around 10am, she has had chest tightness, describes as an elephant sitting on her chest, and lightheadedness with persistent nausea and bloody vomiting. States sister told her she fainted on the way to the bathroom. She states she has lost 6lbs in 4 days. Denies sick contacts. Prior to a few days ago, this has never happened before. No h/o diabetes. Former smoker. No FH of early cardiac disease.  During work up in ED yesterday, CT Abd/Pelvis and [place] at that time with 5.3x3.6cm oval-shaped pelvic mass separate from uterus and ovaries. She was given IVF boluses, antiemetics and referred to OB/GYN, planning for outpatient pelvic MRI. COVID neg yesterday.    Past Medical History:  Diagnosis Date   Asthma    Bacterial vaginitis     Patient Active Problem List   Diagnosis Date Noted   Intractable nausea and vomiting 06/20/2018   Hypokalemia 06/20/2018   Pelvic mass 06/19/2018    Past Surgical History:  Procedure Laterality Date   HERNIA REPAIR       OB History    Gravida  1   Para      Term      Preterm      AB      Living        SAB      TAB      Ectopic      Multiple      Live Births              Home Medications    Prior to Admission medications   Medication Sig Start Date End Date Taking? Authorizing Provider  dicyclomine (BENTYL) 20 MG tablet Take 1 tablet (20 mg total) by mouth 2 (two) times daily. 06/19/18   Margarita Mail, PA-C  HYDROcodone-acetaminophen (NORCO/VICODIN) 5-325 MG tablet Take 1-2 tablets by mouth every 6 (six) hours as needed. 06/20/18   Montine Circle, PA-C  ondansetron (ZOFRAN ODT) 4 MG disintegrating tablet Take 1 tablet (4 mg total) by mouth every 8 (eight) hours as needed for nausea or vomiting. 06/18/18   Recardo Evangelist, PA-C  promethazine (PHENERGAN) 25 MG suppository Place 1 suppository (25 mg total) rectally every 6 (six) hours as needed for nausea or vomiting. 06/20/18   Montine Circle, PA-C  traMADol (ULTRAM) 50 MG tablet Take 1 tablet (50 mg total) by mouth every 6 (six) hours as needed. 06/19/18   Margarita Mail, PA-C   Family History History reviewed. No pertinent family history.  Social History Social History   Tobacco Use   Smoking status: Former Smoker   Smokeless tobacco: Never Used  Substance Use Topics   Alcohol use: No   Drug use: No    Types: Marijuana    Comment: former    Allergies   Patient has no known allergies.  Review of Systems Review of Systems  Constitutional: Negative for fever.  HENT: Negative for congestion and sore throat.  Respiratory: Positive for shortness of breath.   Cardiovascular: Positive for chest pain.  Gastrointestinal: Positive for abdominal pain, nausea and vomiting. Negative for blood in stool and diarrhea.  Genitourinary: Negative for dysuria, flank pain, vaginal bleeding and vaginal discharge.  Skin: Negative for rash.  Neurological: Positive for headaches.   Physical Exam Updated Vital Signs BP (!) 145/94    Pulse 61    Temp 99.9 F (37.7 C) (Oral)    Resp (!) 22    LMP 06/04/2018 (Exact Date)    SpO2 99%    Breastfeeding No   Physical Exam Vitals signs and nursing note reviewed.  Constitutional:      General: She is in acute distress.     Appearance: She is ill-appearing.  HENT:     Head: Normocephalic.  Eyes:     Pupils: Pupils are equal, round, and reactive to light.  Cardiovascular:     Rate and  Rhythm: Normal rate and regular rhythm.     Heart sounds: Normal heart sounds. No murmur.  Pulmonary:     Effort: Pulmonary effort is normal. No tachypnea or respiratory distress.     Breath sounds: Normal breath sounds.  Chest:     Chest wall: No tenderness.  Abdominal:     Palpations: There is no fluid wave.     Tenderness: There is abdominal tenderness. There is guarding. There is no rebound.     Comments: Hypoactive bowel sounds.   Skin:    General: Skin is warm.  Neurological:     Mental Status: She is alert and oriented to person, place, and time.    ED Treatments / Results  Labs (all labs ordered are listed, but only abnormal results are displayed) Labs Reviewed  COMPREHENSIVE METABOLIC PANEL - Abnormal; Notable for the following components:      Result Value   Potassium 3.2 (*)    Calcium 8.0 (*)    Total Protein 6.1 (*)    Total Bilirubin 1.4 (*)    All other components within normal limits  URINALYSIS, ROUTINE W REFLEX MICROSCOPIC - Abnormal; Notable for the following components:   Color, Urine STRAW (*)    Hgb urine dipstick SMALL (*)    Ketones, ur 20 (*)    Leukocytes,Ua TRACE (*)    All other components within normal limits  TROPONIN I  CBC  POC URINE PREG, ED   EKG EKG Interpretation  Date/Time:  Saturday June 20 2018 14:17:33 EDT Ventricular Rate:  62 PR Interval:    QRS Duration: 103 QT Interval:  452 QTC Calculation: 459 R Axis:   2 Text Interpretation:  Sinus rhythm Low voltage, precordial leads RSR' in V1 or V2, right VCD or RVH No previous ECGs available Confirmed by Frederick Peers [phone]) on 06/20/2018 3:23:24 PM  Radiology US Transvaginal Non-ob  Result Date: 06/19/2018 CLINICAL DATA:  Pelvic mass in the presacral space on an abdomen and pelvis CT obtained yesterday. This was concerning for a possible ovarian neoplasm. She presented with acute generalized abdominal pain with nausea, vomiting and diarrhea. Bacterial vaginitis. EXAM:  TRANSABDOMINAL AND TRANSVAGINAL ULTRASOUND OF PELVIS DOPPLER ULTRASOUND OF OVARIES TECHNIQUE: Both transabdominal and transvaginal ultrasound examinations of the pelvis were performed. Transabdominal technique was performed for global imaging of the pelvis including uterus, ovaries, adnexal regions, and pelvic cul-de-sac. It was necessary to proceed with endovaginal exam following the transabdominal exam to visualize the uterus, ovaries and pelvic structures in better detail. Color and duplex Doppler ultrasound was utilized to  evaluate blood flow to the ovaries. COMPARISON:  Abdomen and pelvis CT obtained yesterday. FINDINGS: Uterus Measurements: 7.4 x 4.7 x 4.0 cm = volume: 73 mL. No fibroids or other mass visualized. Endometrium Thickness: 8.9 mm.  No focal abnormality visualized. Right ovary Measurements: 4.1 x 2.3 x 2.0 cm = volume: 10 mL. Normal appearance/no adnexal mass. Left ovary Measurements: 3.0 x 2.6 x 1.8 cm = volume: 7 mL. Normal appearance/no adnexal mass. Pulsed Doppler evaluation of both ovaries demonstrates normal low-resistance arterial and venous waveforms. Other findings There is an oval, mildly heterogeneous and mildly hypoechoic mass in the central pelvis adjacent to the uterus and left ovary. This is circumscribed and measures 5.2 x 4.2 x 3.3 cm. This contains internal blood flow with color Doppler and is separate from the uterus and ovaries. Also noted is a small amount of free peritoneal fluid. IMPRESSION: 1. 5.2 cm oval, solid mass in the pelvic cul-de-sac. This appears separate from the uterus and ovaries and has nonspecific features. This could represent a benign or malignant neoplasm with possibilities including sarcoma and neurogenic tumor. 2. Small amount of free peritoneal fluid. 3. Normal appearing uterus and ovaries. Electronically Signed   By: Beckie SaltsSteven  Reid M.D.   On: 06/19/2018 14:44   [place] Pelvis Complete  Result Date: 06/19/2018 CLINICAL DATA:  Pelvic mass in the presacral  space on an abdomen and pelvis CT obtained yesterday. This was concerning for a possible ovarian neoplasm. She presented with acute generalized abdominal pain with nausea, vomiting and diarrhea. Bacterial vaginitis. EXAM: TRANSABDOMINAL AND TRANSVAGINAL ULTRASOUND OF PELVIS DOPPLER ULTRASOUND OF OVARIES TECHNIQUE: Both transabdominal and transvaginal ultrasound examinations of the pelvis were performed. Transabdominal technique was performed for global imaging of the pelvis including uterus, ovaries, adnexal regions, and pelvic cul-de-sac. It was necessary to proceed with endovaginal exam following the transabdominal exam to visualize the uterus, ovaries and pelvic structures in better detail. Color and duplex Doppler ultrasound was utilized to evaluate blood flow to the ovaries. COMPARISON:  Abdomen and pelvis CT obtained yesterday. FINDINGS: Uterus Measurements: 7.4 x 4.7 x 4.0 cm = volume: 73 mL. No fibroids or other mass visualized. Endometrium Thickness: 8.9 mm.  No focal abnormality visualized. Right ovary Measurements: 4.1 x 2.3 x 2.0 cm = volume: 10 mL. Normal appearance/no adnexal mass. Left ovary Measurements: 3.0 x 2.6 x 1.8 cm = volume: 7 mL. Normal appearance/no adnexal mass. Pulsed Doppler evaluation of both ovaries demonstrates normal low-resistance arterial and venous waveforms. Other findings There is an oval, mildly heterogeneous and mildly hypoechoic mass in the central pelvis adjacent to the uterus and left ovary. This is circumscribed and measures 5.2 x 4.2 x 3.3 cm. This contains internal blood flow with color Doppler and is separate from the uterus and ovaries. Also noted is a small amount of free peritoneal fluid. IMPRESSION: 1. 5.2 cm oval, solid mass in the pelvic cul-de-sac. This appears separate from the uterus and ovaries and has nonspecific features. This could represent a benign or malignant neoplasm with possibilities including sarcoma and neurogenic tumor. 2. Small amount of free  peritoneal fluid. 3. Normal appearing uterus and ovaries. Electronically Signed   By: Beckie SaltsSteven  Reid M.D.   On: 06/19/2018 14:44   Ct Abdomen Pelvis W Contrast  Result Date: 06/19/2018 CLINICAL DATA:  Acute generalized abdominal pain. EXAM: CT ABDOMEN AND PELVIS WITH CONTRAST TECHNIQUE: Multidetector CT imaging of the abdomen and pelvis was performed using the standard protocol following bolus administration of intravenous contrast. CONTRAST:  100mL OMNIPAQUE  IOHEXOL 300 MG/ML  SOLN COMPARISON:  None. FINDINGS: Lower chest: No acute abnormality. Hepatobiliary: No focal liver abnormality is seen. No gallstones, gallbladder wall thickening, or biliary dilatation. Pancreas: Unremarkable. No pancreatic ductal dilatation or surrounding inflammatory changes. Spleen: Normal in size without focal abnormality. Adrenals/Urinary Tract: Adrenal glands are unremarkable. Kidneys are normal, without renal calculi, focal lesion, or hydronephrosis. Bladder is unremarkable. Stomach/Bowel: Stomach is within normal limits. Appendix appears normal. No evidence of bowel wall thickening, distention, or inflammatory changes. Vascular/Lymphatic: No significant vascular findings are present. No enlarged abdominal or pelvic lymph nodes. Reproductive: Uterus and left adnexal region are unremarkable. 5.3 x 3.6 cm oval-shaped solid mass or abnormality is noted in the pre rectal space of the pelvis which may represent ovarian or adnexal abnormality. Small amount of free fluid is seen in this area as well. Other: No definite hernia is noted. Musculoskeletal: No acute or significant osseous findings. IMPRESSION: 5.3 x 3.6 cm oval-shaped solid abnormality or mass is noted in the pre rectal space of the pelvis with mild amount of surrounding free fluid. Potentially this may represent ovarian or adnexal neoplasm, and pelvic ultrasound is recommended for further evaluation. If pelvic ultrasound does not demonstrate this abnormality, further evaluation  with MRI would then be recommended. Electronically Signed   By: Lupita RaiderJames  Green Jr M.D.   On: 06/19/2018 12:37   [place] Art/ven Flow Abd Pelv Doppler  Result Date: 06/19/2018 CLINICAL DATA:  Pelvic mass in the presacral space on an abdomen and pelvis CT obtained yesterday. This was concerning for a possible ovarian neoplasm. She presented with acute generalized abdominal pain with nausea, vomiting and diarrhea. Bacterial vaginitis. EXAM: TRANSABDOMINAL AND TRANSVAGINAL ULTRASOUND OF PELVIS DOPPLER ULTRASOUND OF OVARIES TECHNIQUE: Both transabdominal and transvaginal ultrasound examinations of the pelvis were performed. Transabdominal technique was performed for global imaging of the pelvis including uterus, ovaries, adnexal regions, and pelvic cul-de-sac. It was necessary to proceed with endovaginal exam following the transabdominal exam to visualize the uterus, ovaries and pelvic structures in better detail. Color and duplex Doppler ultrasound was utilized to evaluate blood flow to the ovaries. COMPARISON:  Abdomen and pelvis CT obtained yesterday. FINDINGS: Uterus Measurements: 7.4 x 4.7 x 4.0 cm = volume: 73 mL. No fibroids or other mass visualized. Endometrium Thickness: 8.9 mm.  No focal abnormality visualized. Right ovary Measurements: 4.1 x 2.3 x 2.0 cm = volume: 10 mL. Normal appearance/no adnexal mass. Left ovary Measurements: 3.0 x 2.6 x 1.8 cm = volume: 7 mL. Normal appearance/no adnexal mass. Pulsed Doppler evaluation of both ovaries demonstrates normal low-resistance arterial and venous waveforms. Other findings There is an oval, mildly heterogeneous and mildly hypoechoic mass in the central pelvis adjacent to the uterus and left ovary. This is circumscribed and measures 5.2 x 4.2 x 3.3 cm. This contains internal blood flow with color Doppler and is separate from the uterus and ovaries. Also noted is a small amount of free peritoneal fluid. IMPRESSION: 1. 5.2 cm oval, solid mass in the pelvic cul-de-sac.  This appears separate from the uterus and ovaries and has nonspecific features. This could represent a benign or malignant neoplasm with possibilities including sarcoma and neurogenic tumor. 2. Small amount of free peritoneal fluid. 3. Normal appearing uterus and ovaries. Electronically Signed   By: Beckie SaltsSteven  Reid M.D.   On: 06/19/2018 14:44   Procedures Procedures (including critical care time)  Medications Ordered in ED Medications  acetaminophen (TYLENOL) tablet 650 mg (has no administration in time range)  ondansetron (ZOFRAN) injection  4 mg (4 mg Intravenous Given 06/20/18 1445)  sodium chloride 0.9 % bolus 1,000 mL (0 mLs Intravenous Stopped 06/20/18 2028)  droperidol (INAPSINE) 2.5 MG/ML injection 2.5 mg (2.5 mg Intravenous Given 06/20/18 1638)  fentaNYL (SUBLIMAZE) injection 50 mcg (50 mcg Intravenous Given 06/20/18 1634)  potassium chloride 10 mEq in 100 mL IVPB (0 mEq Intravenous Stopped 06/20/18 1920)  capsaicin (ZOSTRIX) 0.025 % cream ( Topical Given 06/20/18 1853)  morphine 2 MG/ML injection 2 mg (2 mg Intravenous Given 06/20/18 1843)    Initial Impression / Assessment and Plan / ED Course  I have reviewed the triage vital signs and the nursing notes.  Pertinent labs & imaging results that were available during my care of the patient were reviewed by me and considered in my medical decision making (see chart for details).    [id number] - 28yo F with new pelvic mass here w/ persistent nausea and bloody vomiting, now with chest pain. COVID neg yesterday. Previously negative lipase. Symptoms not likely ACS given age and no risk factors although with slight T wave depressions, which may be related to dehydration, will order troponin x1. Exam with generalized abd pain but without rebounding or peritoneal signs, otherwise ill-appearing but hemodynamically stable. Could be cannabis hyperemesis syndrome given UDS +THC previously although denies THC use. Given generalized pain, likely not related to pelvic  mass. Will get basic labs to evaluate further.    1915 - UPreg negative. U/A consistent with dehydration, no evidence of infection. No evidence of hepatic injury. Troponin negative.   2028 - CBC wnl. Reevaluated patient. Pain and nausea improved but still have some pain. Discussed possibility of admission, given this is her 4th ED visit and inability for outpatient medications to control pain and nausea, warrants further workup. Will admit for unassigned.    Final Clinical Impressions(s) / ED Diagnoses   Final diagnoses:  Chest pain    ED Discharge Orders    None     Ellwood DenseAlison Jahquan Klugh, DO PGY-2, [facility] 06/20/2018 9:54 PM     Ellwood Denseumball, Jong Rickman, DO 06/20/18 2154    Little, Ambrose Finlandachel Morgan, MD 06/26/18 1610

## 2018-06-20 NOTE — ED Notes (Signed)
Pt was given juice.  Dr. Rex Kras stated it was ok.  Ice chips from now on.  Pt is aware

## 2018-06-20 NOTE — ED Triage Notes (Addendum)
Pt endorses continued chest pain with n/v with "blood in my vomit again" Unable to keep anything down. Seen here 3 times for this already this week. Covid neg yesterday.

## 2018-06-20 NOTE — ED Notes (Signed)
Patient reports vomiting after she took norco, states that phenergan helped some but she still feels nauseated.

## 2018-06-21 DIAGNOSIS — Z87891 Personal history of nicotine dependence: Secondary | ICD-10-CM | POA: Diagnosis not present

## 2018-06-21 DIAGNOSIS — R0789 Other chest pain: Secondary | ICD-10-CM | POA: Diagnosis present

## 2018-06-21 DIAGNOSIS — J45909 Unspecified asthma, uncomplicated: Secondary | ICD-10-CM | POA: Diagnosis present

## 2018-06-21 DIAGNOSIS — E876 Hypokalemia: Secondary | ICD-10-CM | POA: Diagnosis present

## 2018-06-21 DIAGNOSIS — R112 Nausea with vomiting, unspecified: Secondary | ICD-10-CM | POA: Diagnosis not present

## 2018-06-21 DIAGNOSIS — R1900 Intra-abdominal and pelvic swelling, mass and lump, unspecified site: Secondary | ICD-10-CM | POA: Diagnosis present

## 2018-06-21 LAB — CBC WITH DIFFERENTIAL/PLATELET
Abs Immature Granulocytes: 0.02 10*3/uL (ref 0.00–0.07)
Basophils Absolute: 0 10*3/uL (ref 0.0–0.1)
Basophils Relative: 1 %
Eosinophils Absolute: 0 10*3/uL (ref 0.0–0.5)
Eosinophils Relative: 0 %
HCT: 38.4 % (ref 36.0–46.0)
Hemoglobin: 13.5 g/dL (ref 12.0–15.0)
Immature Granulocytes: 0 %
Lymphocytes Relative: 32 %
Lymphs Abs: 2.4 10*3/uL (ref 0.7–4.0)
MCH: 32.3 pg (ref 26.0–34.0)
MCHC: 35.2 g/dL (ref 30.0–36.0)
MCV: 91.9 fL (ref 80.0–100.0)
Monocytes Absolute: 1 10*3/uL (ref 0.1–1.0)
Monocytes Relative: 13 %
Neutro Abs: 4.2 10*3/uL (ref 1.7–7.7)
Neutrophils Relative %: 54 %
Platelets: 211 10*3/uL (ref 150–400)
RBC: 4.18 MIL/uL (ref 3.87–5.11)
RDW: 11.9 % (ref 11.5–15.5)
WBC: 7.7 10*3/uL (ref 4.0–10.5)
nRBC: 0 % (ref 0.0–0.2)

## 2018-06-21 LAB — COMPREHENSIVE METABOLIC PANEL
ALT: 13 U/L (ref 0–44)
AST: 15 U/L (ref 15–41)
Albumin: 3.3 g/dL — ABNORMAL LOW (ref 3.5–5.0)
Alkaline Phosphatase: 57 U/L (ref 38–126)
Anion gap: 13 (ref 5–15)
BUN: <5 mg/dL — ABNORMAL LOW (ref 6–20)
CO2: 26 mmol/L (ref 22–32)
Calcium: 8.1 mg/dL — ABNORMAL LOW (ref 8.9–10.3)
Chloride: 100 mmol/L (ref 98–111)
Creatinine, Ser: 0.86 mg/dL (ref 0.44–1.00)
GFR calc Af Amer: >60 mL/min (ref >60)
GFR calc non Af Amer: >60 mL/min (ref >60)
Glucose, Bld: 82 mg/dL (ref 70–99)
Potassium: 2.7 mmol/L — CL (ref 3.5–5.1)
Sodium: 139 mmol/L (ref 135–145)
Total Bilirubin: 0.7 mg/dL (ref 0.3–1.2)
Total Protein: 6 g/dL — ABNORMAL LOW (ref 6.5–8.1)

## 2018-06-21 LAB — HIV ANTIBODY (ROUTINE TESTING W REFLEX): HIV Screen 4th Generation wRfx: NONREACTIVE

## 2018-06-21 LAB — GLUCOSE, CAPILLARY
Glucose-Capillary: 66 mg/dL — ABNORMAL LOW (ref 70–99)
Glucose-Capillary: 84 mg/dL (ref 70–99)

## 2018-06-21 LAB — LIPASE, BLOOD: Lipase: 23 U/L (ref 11–51)

## 2018-06-21 LAB — MAGNESIUM: Magnesium: 2 mg/dL (ref 1.7–2.4)

## 2018-06-21 MED ORDER — POTASSIUM CHLORIDE 10 MEQ/100ML IV SOLN
10.0000 meq | INTRAVENOUS | Status: AC
  Administered 2018-06-21 (×4): 10 meq via INTRAVENOUS
  Filled 2018-06-21 (×4): qty 100

## 2018-06-21 MED ORDER — POTASSIUM CHLORIDE IN NACL 20-0.9 MEQ/L-% IV SOLN
INTRAVENOUS | Status: AC
  Administered 2018-06-21: 21:00:00 via INTRAVENOUS
  Filled 2018-06-21: qty 1000

## 2018-06-21 MED ORDER — BOOST / RESOURCE BREEZE PO LIQD CUSTOM
1.0000 | Freq: Three times a day (TID) | ORAL | Status: DC
  Administered 2018-06-21 – 2018-06-22 (×2): 1 via ORAL

## 2018-06-21 NOTE — Progress Notes (Signed)
Notified by central tele that pt is bradycardic. Pt HR 49. Pt asymptomatic. On call NP, Bodenheimer, made aware. Will continue to monitor.

## 2018-06-21 NOTE — Progress Notes (Signed)
PROGRESS NOTE    Krista Stout  ZOX:[id number] DOB: 02/22/1990 DOA: 06/20/2018 PCP: System, Pcp Not In    Brief Narrative:  28 y.o. female with medical history significant for asthma, now presenting to the emergency department for evaluation of abdominal pain, nausea, vomiting, chest pain.  Patient reports that she been in her usual state of health until approximately 4 days ago when she developed subjective fevers, nausea, vomiting, and nonbloody diarrhea.  She has been seen in the emergency department multiple times over the past couple days for this, was found to have a pelvic mass that will need further work-up, had stable vitals and reassuringly went home with Norco and antiemetics, but the symptoms have persisted and she has been unable to tolerate anything by mouth.  The diarrhea seems to have resolved over the past couple days and the patient is no longer having chills, but generalized abdominal pain persists, she continues to have nausea and vomits with small streaks of red blood, and has been unable to eat or drink.  She woke with chest discomfort this morning that she thought could be related to her asthma, but she was not having any wheezing or cough and symptoms did not improve with her albuterol inhaler.  Chest pain is mild, persisting, worse with vomiting.    ED Course: Upon arrival to the ED, patient is found to be afebrile, saturating well on room air, and with vitals otherwise stable.  EKG features a sinus rhythm.  Chemistry panel is notable for potassium of 3.2 and total bilirubin 1.4.  CBC is unremarkable.  Troponin is undetectable, pregnancy test is negative, and lipase was normal yesterday.  Urinalysis notable for ketonuria microscopic hematuria.  Patient was given a liter of normal saline, 20 mEq IV potassium, fentanyl, morphine, acetaminophen, Zofran, and even droperidol in the ED.  She remains hemodynamically stable, is not in any apparent respiratory distress, but continues to  have severe nausea, vomiting with small streaks of blood, unable to eat or drink, and will be observed for ongoing evaluation and management.   Assessment & Plan:   Principal Problem:   Intractable nausea and vomiting Active Problems:   Pelvic mass   Hypokalemia   Chest pain   Abdominal pain with vomiting  1. Abdominal pain with N/V  - Presented with 4 days of abdominal pain with N/V and subjective fever/chills  - She had diarrhea early in the course but has resolved  - CT (6/5) with adnexal mass as discussed below  - Pregnancy test negative - Abd is soft on exam with active bowel sounds and no peritoneal signs   - Treated in ED with IVF, Zofran, pain-control, droperidol, but continues to complain of nausea and vomiting  - This AM, patient continues to vomit, unable to tolerate PO. Emesis bag at bedside. Will continue with IVF hydration as pt is not tolerating PO intake - Plan to advance diet with clinical improvement  2. Chest pain - Reports 1 day of chest pain not suggestive of ACS and with reassuring EKG and troponin, and likely secondary to her intractable N/V  - CXR personally reviewed, normal - Continued on PPI  3. Hypokalemia  - Potassium remains low, 2.7 this AM - Continue to replace as tolerated - Repeat bmet in  AM  3. Pelvic mass  - Noted on CT and US in ED on 06/19/18, could be benign or malignant   - She was referred to Gyn onc and will get MRI pelvis outpatient to  further characterize  - Pelvic US reviewed personally. Finding of 5.2cm oval mass in pelvic cul-de-sac. Follow up with GYN per above  4. Asthma  - No cough, wheeze, or SOB on admsision - Continue as-needed albuterol - On minimal O2 support    DVT prophylaxis: SCD's Code Status: Full Family Communication: Pt in room, family not at bedside Disposition Plan: Uncertain at this time  Consultants:     Procedures:     Antimicrobials: Anti-infectives (From admission, onward)   None        Subjective: Still complaining of marked nausea, vomiting, unable to tolerate any PO this AM (threw up trial of chicken broth).  Objective: Vitals:   06/20/18 2241 06/20/18 2309 06/21/18 0556 06/21/18 0646  BP: 119/88 (!) 154/93  104/66  Pulse: 60 (!) 53  (!) 48  Resp: 16     Temp:  99.3 F (37.4 C)  98.8 F (37.1 C)  TempSrc:  Oral  Oral  SpO2: 99% 100%  99%  Weight:   61.2 kg   Height:   5\' 2"  (1.575 m)     Intake/Output Summary (Last 24 hours) at 06/21/2018 1638 Last data filed at 06/21/2018 1116 Gross per 24 hour  Intake 2301.2 ml  Output --  Net 2301.2 ml   Filed Weights   06/21/18 0556  Weight: 61.2 kg    Examination:  General exam: Appears calm and, laying in bed uncomfortable Respiratory system: Clear to auscultation. Respiratory effort normal. Cardiovascular system: S1 & S2 heard, RRR Gastrointestinal system: Pos BS, generally uncomfortable, worse in lower quadrants Central nervous system: Alert and oriented. No focal neurological deficits. Extremities: Symmetric 5 x 5 power. Skin: No rashes, lesions Psychiatry: Judgement and insight appear normal. Mood & affect appropriate.   Data Reviewed: I have personally reviewed following labs and imaging studies  CBC: Recent Labs  Lab 06/18/18 1012 06/19/18 0932 06/19/18 2242 06/20/18 1958 06/21/18 0347  WBC 6.0 9.6 7.4 7.3 7.7  NEUTROABS  --  6.8 4.7  --  4.2  HGB 14.3 13.5 13.7 13.6 13.5  HCT 42.9 39.6 40.7 41.0 38.4  MCV 95.5 93.6 93.8 95.6 91.9  PLT 241 235 236 191 458   Basic Metabolic Panel: Recent Labs  Lab 06/18/18 1012 06/19/18 0932 06/19/18 2242 06/20/18 1543 06/21/18 0347  NA 139 137 137 139 139  K 3.6 3.2* 2.9* 3.2* 2.7*  CL 104 104 101 99 100  CO2 25 22 24 28 26   GLUCOSE 137* 108* 100* 88 82  BUN 5* <5* <5* 7 <5*  CREATININE 0.90 0.82 0.87 0.99 0.86  CALCIUM 9.4 8.9 8.7* 8.0* 8.1*  MG  --   --   --   --  2.0   GFR: Estimated Creatinine Clearance: 83.8 mL/min (by C-G formula  based on SCr of 0.86 mg/dL). Liver Function Tests: Recent Labs  Lab 06/18/18 1012 06/19/18 0932 06/19/18 2242 06/20/18 1543 06/21/18 0347  AST 19 18 20 25 15   ALT 18 19 17 17 13   ALKPHOS 73 65 60 53 57  BILITOT 0.7 0.6 0.5 1.4* 0.7  PROT 7.6 7.1 6.8 6.1* 6.0*  ALBUMIN 4.5 4.1 4.0 3.6 3.3*   Recent Labs  Lab 06/18/18 1012 06/19/18 0932 06/19/18 2242 06/21/18 0347  LIPASE 21 27 30 23    No results for input(s): AMMONIA in the last 168 hours. Coagulation Profile: No results for input(s): INR, PROTIME in the last 168 hours. Cardiac Enzymes: Recent Labs  Lab 06/20/18 1543  TROPONINI <0.03  BNP (last 3 results) No results for input(s): PROBNP in the last 8760 hours. HbA1C: No results for input(s): HGBA1C in the last 72 hours. CBG: Recent Labs  Lab 06/21/18 0758 06/21/18 1016  GLUCAP 66* 84   Lipid Profile: No results for input(s): CHOL, HDL, LDLCALC, TRIG, CHOLHDL, LDLDIRECT in the last 72 hours. Thyroid Function Tests: No results for input(s): TSH, T4TOTAL, FREET4, T3FREE, THYROIDAB in the last 72 hours. Anemia Panel: No results for input(s): VITAMINB12, FOLATE, FERRITIN, TIBC, IRON, RETICCTPCT in the last 72 hours. Sepsis Labs: No results for input(s): PROCALCITON, LATICACIDVEN in the last 168 hours.  Recent Results (from the past 240 hour(s))  SARS Coronavirus 2 (CEPHEID - Performed in [facility] lab), Hosp Order     Status: None   Collection Time: 06/19/18 10:42 PM  Result Value Ref Range Status   SARS Coronavirus 2 NEGATIVE NEGATIVE Final    Comment: (NOTE) If result is NEGATIVE SARS-CoV-2 target nucleic acids are NOT DETECTED. The SARS-CoV-2 RNA is generally detectable in upper and lower  respiratory specimens during the acute phase of infection. The lowest  concentration of SARS-CoV-2 viral copies this assay can detect is 250  copies / mL. A negative result does not preclude SARS-CoV-2 infection  and should not be used as the sole basis for  treatment or other  patient management decisions.  A negative result may occur with  improper specimen collection / handling, submission of specimen other  than nasopharyngeal swab, presence of viral mutation(s) within the  areas targeted by this assay, and inadequate number of viral copies  (<250 copies / mL). A negative result must be combined with clinical  observations, patient history, and epidemiological information. If result is POSITIVE SARS-CoV-2 target nucleic acids are DETECTED. The SARS-CoV-2 RNA is generally detectable in upper and lower  respiratory specimens dur ing the acute phase of infection.  Positive  results are indicative of active infection with SARS-CoV-2.  Clinical  correlation with patient history and other diagnostic information is  necessary to determine patient infection status.  Positive results do  not rule out bacterial infection or co-infection with other viruses. If result is PRESUMPTIVE POSTIVE SARS-CoV-2 nucleic acids MAY BE PRESENT.   A presumptive positive result was obtained on the submitted specimen  and confirmed on repeat testing.  While 2019 novel coronavirus  (SARS-CoV-2) nucleic acids may be present in the submitted sample  additional confirmatory testing may be necessary for epidemiological  and / or clinical management purposes  to differentiate between  SARS-CoV-2 and other Sarbecovirus currently known to infect humans.  If clinically indicated additional testing with an alternate test  methodology [phone]([id number]) is advised. The SARS-CoV-2 RNA is generally  detectable in upper and lower respiratory sp ecimens during the acute  phase of infection. The expected result is Negative. Fact Sheet for Patients:  BoilerBrush.com.cyhttps://www.fda.gov/media/136312/download Fact Sheet for Healthcare Providers: https://pope.com/https://www.fda.gov/media/136313/download This test is not yet approved or cleared by the [place] FDA and has been authorized for detection and/or  diagnosis of SARS-CoV-2 by FDA under an Emergency Use Authorization (EUA).  This EUA will remain in effect (meaning this test can be used) for the duration of the COVID-19 declaration under Section 564(b)(1) of the Act, 21 U.S.C. section 360bbb-3(b)(1), unless the authorization is terminated or revoked sooner. Performed at [facility], [address] [place], [place] [address]      Radiology Studies: Dg Chest [place]  Result Date: 06/20/2018 CLINICAL DATA:  28 year old female with  history of chest pain. EXAM: PORTABLE CHEST 1 VIEW COMPARISON:  No priors. FINDINGS: Lung volumes are normal. No consolidative airspace disease. No pleural effusions. No pneumothorax. No pulmonary nodule or mass noted. Pulmonary vasculature and the cardiomediastinal silhouette are within normal limits. IMPRESSION: No radiographic evidence of acute cardiopulmonary disease. Electronically Signed   By: Trudie Reedaniel  Entrikin M.D.   On: 06/20/2018 22:19    Scheduled Meds:  feeding supplement  1 Container Oral TID BM   pantoprazole (PROTONIX) IV  40 mg Intravenous Q12H   sodium chloride flush  3 mL Intravenous Q12H   Continuous Infusions:  0.9 % NaCl with KCl 20 mEq / L 100 mL/hr at 06/21/18 1116     LOS: 0 days   Rickey BarbaraStephen Raelene Trew, MD [facility] Pager On Amion  If 7PM-7AM, please contact night-coverage 06/21/2018, 4:38 PM

## 2018-06-21 NOTE — Progress Notes (Signed)
Patient's CBG this AM was 66, patient resting in bed asymptomatic; after 240 ml orange juice CBG came up to 84. MD notified. Will continue to monitor.

## 2018-06-21 NOTE — Progress Notes (Signed)
Krista Stout is a 28 y.o. female patient admitted from ED awake, alert - oriented  X 4 - no acute distress noted.  VSS - Blood pressure (!) 154/93, pulse (!) 53, temperature 99.3 F (37.4 C), temperature source Oral, resp. rate 16, last menstrual period 06/04/2018, SpO2 100 %, not currently breastfeeding.    Pt continues to c/o abdominal and chest pain and nausea. Will give ordered PRN medications. IV in place, occlusive dsg intact without redness.  Orientation to room, and floor completed. Admission INP armband ID verified with patient, and in place.   SR up x 2, fall assessment complete, with patient able to verbalize understanding of risk associated with falls, and verbalized understanding to call nsg before up out of bed.  Call light within reach, patient able to voice, and demonstrate understanding.  Skin, clean-dry- intact without evidence of bruising, or skin tears.   No evidence of skin break down noted on exam.     Will cont to eval and treat per MD orders.  Valorie Roosevelt, RN 06/21/2018 2:33 AM

## 2018-06-21 NOTE — Progress Notes (Signed)
CRITICAL VALUE ALERT  Critical Value:  Potassium 2.7  /Date & Time Notied:  06/21/2018 0555  Provider Notified: Silas Sacramento  Orders Received/Actions taken: Awaiting orders

## 2018-06-21 NOTE — Plan of Care (Signed)
  Problem: Activity: Goal: Risk for activity intolerance will decrease Outcome: Progressing   Problem: Nutrition: Goal: Adequate nutrition will be maintained Outcome: Not Progressing   

## 2018-06-22 LAB — BASIC METABOLIC PANEL
Anion gap: 7 (ref 5–15)
BUN: <5 mg/dL — ABNORMAL LOW (ref 6–20)
CO2: 29 mmol/L (ref 22–32)
Calcium: 8.4 mg/dL — ABNORMAL LOW (ref 8.9–10.3)
Chloride: 102 mmol/L (ref 98–111)
Creatinine, Ser: 1.05 mg/dL — ABNORMAL HIGH (ref 0.44–1.00)
GFR calc Af Amer: >60 mL/min (ref >60)
GFR calc non Af Amer: >60 mL/min (ref >60)
Glucose, Bld: 82 mg/dL (ref 70–99)
Potassium: 3.1 mmol/L — ABNORMAL LOW (ref 3.5–5.1)
Sodium: 138 mmol/L (ref 135–145)

## 2018-06-22 LAB — GLUCOSE, CAPILLARY: Glucose-Capillary: 76 mg/dL (ref 70–99)

## 2018-06-22 MED ORDER — POTASSIUM CHLORIDE 10 MEQ/100ML IV SOLN
10.0000 meq | INTRAVENOUS | Status: DC
  Filled 2018-06-22: qty 100

## 2018-06-22 MED ORDER — POTASSIUM CHLORIDE 10 MEQ/100ML IV SOLN
10.0000 meq | INTRAVENOUS | Status: DC
  Administered 2018-06-22 (×2): 10 meq via INTRAVENOUS
  Filled 2018-06-22: qty 100

## 2018-06-22 MED ORDER — POTASSIUM CHLORIDE CRYS ER 20 MEQ PO TBCR
40.0000 meq | EXTENDED_RELEASE_TABLET | Freq: Once | ORAL | Status: AC
  Administered 2018-06-22: 40 meq via ORAL
  Filled 2018-06-22: qty 2

## 2018-06-22 NOTE — Progress Notes (Signed)
Pt seen by MD, orders written for discharge. RN went over discharge instructions with pt and answered all questions. RN removed IV's. Escorted for discharge via wheelchair with all belongings. Pt will follow up outpatient with MD. Pt seen by MD, orders written for discharge. RN went over discharge instructions with pt and answered all questions. RN removed IV's. Escorted for discharge via wheelchair with all belongings. Pt will follow up outpatient with MD.

## 2018-06-22 NOTE — Discharge Summary (Signed)
Physician Discharge Summary  Krista Stout ZOX:[id number] DOB: [birth year] DOA: 06/20/2018  PCP: System, Pcp Not In  Admit date: 06/20/2018 Discharge date: 06/22/2018  Admitted From: Home Disposition:  Home  Recommendations for Outpatient Follow-up:  1. Follow up with PCP in 2-3 weeks 2. Follow up with [facility]  Discharge Condition:Improved CODE STATUS:Full Diet recommendation: Regular   Brief/Interim Summary: 28 y.o.femalewith medical history significant forasthma, now presenting to the emergency department for evaluation of abdominal pain, nausea, vomiting, chest pain. Patient reports that she been in her usual state of health until approximately 4 days ago when she developed subjective fevers, nausea, vomiting, and nonbloody diarrhea. She has been seen in the emergency department multiple times over the past couple days for this, was found to have a pelvic mass that will need further work-up, had stable vitals and reassuringly went home with Norco and antiemetics, but the symptoms have persisted and she has been unable to tolerate anything by mouth. The diarrhea seems to have resolved over the past couple days and the patient is no longer having chills, but generalized abdominal pain persists, she continues to have nausea and vomits with small streaks of red blood, and has been unable to eat or drink. She woke with chest discomfort this morning that she thought could be related to her asthma, but she was not having any wheezing or cough and symptoms did not improve with her albuterol inhaler. Chest pain is mild, persisting, worse with vomiting.   ED Course:Upon arrival to the ED, patient is found to be afebrile, saturating well on room air, and with vitals otherwise stable. EKG features a sinus rhythm. Chemistry panel is notable for potassium of 3.2 and total bilirubin 1.4. CBC is unremarkable. Troponin is undetectable, pregnancy test is negative, and lipase was normal  yesterday. Urinalysis notable for ketonuria microscopic hematuria. Patient was given a liter of normal saline, 20 mEq IV potassium, fentanyl, morphine, acetaminophen, Zofran, and even droperidol in the ED. She remains hemodynamically stable, is not in any apparent respiratory distress, but continues to have severe nausea, vomiting with small streaks of blood, unable to eat or drink,and will be observed for ongoing evaluation and management.   Discharge Diagnoses:  Principal Problem:   Intractable nausea and vomiting Active Problems:   Pelvic mass   Hypokalemia   Chest pain   Abdominal pain with vomiting   1.Abdominal pain with N/V -Presented with 4 days of abdominal pain with N/V and subjective fever/chills -She had diarrhea early in the course but has resolved -CT (6/5) with adnexal mass as discussed below  - Pregnancy test negative -Abd is soft on exam with active bowel sounds and no peritoneal signs -Treated in ED with IVF, Zofran, pain-control, droperidol, but continues to complain of nausea and vomiting -Symptoms noted to be self-limiting and patient later able to tolerate regular diet - Of note, patient noted to be pos for marijuana. 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 Hypokalemia -Potassium remains low, 2.7 this AM - Remained low on repeat BMET, replaced  3.Pelvic mass -Noted on CT and [place] in ED on 06/19/18, could be benign or malignant -She was referred to Gyn onc and will get MRI pelvis outpatient to further characterize - Pelvic US reviewed personally. Finding of 5.2cm oval mass in pelvic cul-de-sac.  Follow up with GYN per above  4.Asthma -No cough, wheeze, or SOB on admsision - Stable   Discharge Instructions   Allergies  as of 06/22/2018   No Known Allergies     Medication List    TAKE these medications   dicyclomine 20 MG tablet Commonly known as:  BENTYL Take 1 tablet (20 mg total) by mouth 2 (two) times daily.   HYDROcodone-acetaminophen 5-325 MG tablet Commonly known as:  NORCO/VICODIN Take 1-2 tablets by mouth every 6 (six) hours as needed.   ondansetron 4 MG disintegrating tablet Commonly known as:  Zofran ODT Take 1 tablet (4 mg total) by mouth every 8 (eight) hours as needed for nausea or vomiting.   promethazine 25 MG suppository Commonly known as:  PHENERGAN Place 1 suppository (25 mg total) rectally every 6 (six) hours as needed for nausea or vomiting.   traMADol 50 MG tablet Commonly known as:  ULTRAM Take 1 tablet (50 mg total) by mouth every 6 (six) hours as needed.      Follow-up Information    Follow up with PCP in 2-3 weeks. Schedule an appointment as soon as possible for a visit.        [facility] Schedule an appointment as soon as possible for a visit.   Contact information: [address], [place], [place] [address] Phone: [phone]         No Known Allergies   Procedures/Studies: Kore[place] Transvaginal Non-ob  Result Date: 06/19/2018 CLINICAL DATA:  Pelvic mass in the presacral space on an abdomen and pelvis CT obtained yesterday. This was concerning for a possible ovarian neoplasm. She presented with acute generalized abdominal pain with nausea, vomiting and diarrhea. Bacterial vaginitis. EXAM: TRANSABDOMINAL AND TRANSVAGINAL ULTRASOUND OF PELVIS DOPPLER ULTRASOUND OF OVARIES TECHNIQUE: Both transabdominal and transvaginal ultrasound examinations of the pelvis were performed. Transabdominal technique was performed for global imaging of the pelvis including uterus, ovaries, adnexal regions, and pelvic cul-de-sac. It was necessary to proceed with endovaginal exam following the transabdominal exam to visualize the uterus, ovaries and  pelvic structures in better detail. Color and duplex Doppler ultrasound was utilized to evaluate blood flow to the ovaries. COMPARISON:  Abdomen and pelvis CT obtained yesterday. FINDINGS: Uterus Measurements: 7.4 x 4.7 x 4.0 cm = volume: 73 mL. No fibroids or other mass visualized. Endometrium Thickness: 8.9 mm.  No focal abnormality visualized. Right ovary Measurements: 4.1 x 2.3 x 2.0 cm = volume: 10 mL. Normal appearance/no adnexal mass. Left ovary Measurements: 3.0 x 2.6 x 1.8 cm = volume: 7 mL. Normal appearance/no adnexal mass. Pulsed Doppler evaluation of both ovaries demonstrates normal low-resistance arterial and venous waveforms. Other findings There is an oval, mildly heterogeneous and mildly hypoechoic mass in the central pelvis adjacent to the uterus and left ovary. This is circumscribed and measures 5.2 x 4.2 x 3.3 cm. This contains internal blood flow with color Doppler and is separate from the uterus and ovaries. Also noted is a small amount of free peritoneal fluid. IMPRESSION: 1. 5.2 cm oval, solid mass in the pelvic cul-de-sac. This appears separate from the uterus and ovaries and has nonspecific features. This could represent a benign or malignant neoplasm with possibilities including sarcoma and neurogenic tumor. 2. Small amount of free peritoneal fluid. 3. Normal appearing uterus and ovaries. Electronically Signed   By: Beckie SaltsSteven  Reid M.D.   On: 06/19/2018 14:44   [place] Pelvis Complete  Result Date: 06/19/2018 CLINICAL DATA:  Pelvic mass  in the presacral space on an abdomen and pelvis CT obtained yesterday. This was concerning for a possible ovarian neoplasm. She presented with acute generalized abdominal pain with nausea, vomiting and diarrhea. Bacterial vaginitis. EXAM: TRANSABDOMINAL AND TRANSVAGINAL ULTRASOUND OF PELVIS DOPPLER ULTRASOUND OF OVARIES TECHNIQUE: Both transabdominal and transvaginal ultrasound examinations of the pelvis were performed. Transabdominal technique was performed for  global imaging of the pelvis including uterus, ovaries, adnexal regions, and pelvic cul-de-sac. It was necessary to proceed with endovaginal exam following the transabdominal exam to visualize the uterus, ovaries and pelvic structures in better detail. Color and duplex Doppler ultrasound was utilized to evaluate blood flow to the ovaries. COMPARISON:  Abdomen and pelvis CT obtained yesterday. FINDINGS: Uterus Measurements: 7.4 x 4.7 x 4.0 cm = volume: 73 mL. No fibroids or other mass visualized. Endometrium Thickness: 8.9 mm.  No focal abnormality visualized. Right ovary Measurements: 4.1 x 2.3 x 2.0 cm = volume: 10 mL. Normal appearance/no adnexal mass. Left ovary Measurements: 3.0 x 2.6 x 1.8 cm = volume: 7 mL. Normal appearance/no adnexal mass. Pulsed Doppler evaluation of both ovaries demonstrates normal low-resistance arterial and venous waveforms. Other findings There is an oval, mildly heterogeneous and mildly hypoechoic mass in the central pelvis adjacent to the uterus and left ovary. This is circumscribed and measures 5.2 x 4.2 x 3.3 cm. This contains internal blood flow with color Doppler and is separate from the uterus and ovaries. Also noted is a small amount of free peritoneal fluid. IMPRESSION: 1. 5.2 cm oval, solid mass in the pelvic cul-de-sac. This appears separate from the uterus and ovaries and has nonspecific features. This could represent a benign or malignant neoplasm with possibilities including sarcoma and neurogenic tumor. 2. Small amount of free peritoneal fluid. 3. Normal appearing uterus and ovaries. Electronically Signed   By: Beckie SaltsSteven  Reid M.D.   On: 06/19/2018 14:44   Ct Abdomen Pelvis W Contrast  Result Date: 06/19/2018 CLINICAL DATA:  Acute generalized abdominal pain. EXAM: CT ABDOMEN AND PELVIS WITH CONTRAST TECHNIQUE: Multidetector CT imaging of the abdomen and pelvis was performed using the standard protocol following bolus administration of intravenous contrast. CONTRAST:   100mL OMNIPAQUE IOHEXOL 300 MG/ML  SOLN COMPARISON:  None. FINDINGS: Lower chest: No acute abnormality. Hepatobiliary: No focal liver abnormality is seen. No gallstones, gallbladder wall thickening, or biliary dilatation. Pancreas: Unremarkable. No pancreatic ductal dilatation or surrounding inflammatory changes. Spleen: Normal in size without focal abnormality. Adrenals/Urinary Tract: Adrenal glands are unremarkable. Kidneys are normal, without renal calculi, focal lesion, or hydronephrosis. Bladder is unremarkable. Stomach/Bowel: Stomach is within normal limits. Appendix appears normal. No evidence of bowel wall thickening, distention, or inflammatory changes. Vascular/Lymphatic: No significant vascular findings are present. No enlarged abdominal or pelvic lymph nodes. Reproductive: Uterus and left adnexal region are unremarkable. 5.3 x 3.6 cm oval-shaped solid mass or abnormality is noted in the pre rectal space of the pelvis which may represent ovarian or adnexal abnormality. Small amount of free fluid is seen in this area as well. Other: No definite hernia is noted. Musculoskeletal: No acute or significant osseous findings. IMPRESSION: 5.3 x 3.6 cm oval-shaped solid abnormality or mass is noted in the pre rectal space of the pelvis with mild amount of surrounding free fluid. Potentially this may represent ovarian or adnexal neoplasm, and pelvic ultrasound is recommended for further evaluation. If pelvic ultrasound does not demonstrate this abnormality, further evaluation with MRI would then be recommended. Electronically Signed   By: Zenda AlpersJames  Green Jr M.D.  On: 06/19/2018 12:37   [place] Art/ven Flow Abd Pelv Doppler  Result Date: 06/19/2018 CLINICAL DATA:  Pelvic mass in the presacral space on an abdomen and pelvis CT obtained yesterday. This was concerning for a possible ovarian neoplasm. She presented with acute generalized abdominal pain with nausea, vomiting and diarrhea. Bacterial vaginitis. EXAM:  TRANSABDOMINAL AND TRANSVAGINAL ULTRASOUND OF PELVIS DOPPLER ULTRASOUND OF OVARIES TECHNIQUE: Both transabdominal and transvaginal ultrasound examinations of the pelvis were performed. Transabdominal technique was performed for global imaging of the pelvis including uterus, ovaries, adnexal regions, and pelvic cul-de-sac. It was necessary to proceed with endovaginal exam following the transabdominal exam to visualize the uterus, ovaries and pelvic structures in better detail. Color and duplex Doppler ultrasound was utilized to evaluate blood flow to the ovaries. COMPARISON:  Abdomen and pelvis CT obtained yesterday. FINDINGS: Uterus Measurements: 7.4 x 4.7 x 4.0 cm = volume: 73 mL. No fibroids or other mass visualized. Endometrium Thickness: 8.9 mm.  No focal abnormality visualized. Right ovary Measurements: 4.1 x 2.3 x 2.0 cm = volume: 10 mL. Normal appearance/no adnexal mass. Left ovary Measurements: 3.0 x 2.6 x 1.8 cm = volume: 7 mL. Normal appearance/no adnexal mass. Pulsed Doppler evaluation of both ovaries demonstrates normal low-resistance arterial and venous waveforms. Other findings There is an oval, mildly heterogeneous and mildly hypoechoic mass in the central pelvis adjacent to the uterus and left ovary. This is circumscribed and measures 5.2 x 4.2 x 3.3 cm. This contains internal blood flow with color Doppler and is separate from the uterus and ovaries. Also noted is a small amount of free peritoneal fluid. IMPRESSION: 1. 5.2 cm oval, solid mass in the pelvic cul-de-sac. This appears separate from the uterus and ovaries and has nonspecific features. This could represent a benign or malignant neoplasm with possibilities including sarcoma and neurogenic tumor. 2. Small amount of free peritoneal fluid. 3. Normal appearing uterus and ovaries. Electronically Signed   By: Beckie Salts M.D.   On: 06/19/2018 14:44   Dg Chest Port 1 View  Result Date: 06/20/2018 CLINICAL DATA:  28 year old female with history  of chest pain. EXAM: PORTABLE CHEST 1 VIEW COMPARISON:  No priors. FINDINGS: Lung volumes are normal. No consolidative airspace disease. No pleural effusions. No pneumothorax. No pulmonary nodule or mass noted. Pulmonary vasculature and the cardiomediastinal silhouette are within normal limits. IMPRESSION: No radiographic evidence of acute cardiopulmonary disease. Electronically Signed   By: Trudie Reed M.D.   On: 06/20/2018 22:19     Subjective: Eager to go home  Discharge Exam: Vitals:   06/22/18 0508 06/22/18 1354  BP: 130/87 104/76  Pulse: (!) 51 64  Resp: 18 18  Temp: 99.5 F (37.5 C)   SpO2: 99% 100%   Vitals:   06/21/18 2120 06/22/18 0508 06/22/18 0559 06/22/18 1354  BP: 113/83 130/87  104/76  Pulse: 65 (!) 51  64  Resp: Temp: 99.2 F (37.3 C) 99.5 F (37.5 C)    TempSrc: Oral Oral    SpO2: 100% 99%  100%  Weight:   59.6 kg   Height:        General: Pt is alert, awake, not in acute distress Cardiovascular: RRR, S1/S2 +, no rubs, no gallops Respiratory: CTA bilaterally, no wheezing, no rhonchi Abdominal: Soft, NT, ND, bowel sounds + Extremities: no edema, no cyanosis   The results of significant diagnostics from this hospitalization (including imaging, microbiology, ancillary and laboratory) are listed below for reference.  Microbiology: Recent Results (from the past 240 hour(s))  SARS Coronavirus 2 (CEPHEID - Performed in [place] hospital lab), Hosp Order     Status: None   Collection Time: 06/19/18 10:42 PM  Result Value Ref Range Status   SARS Coronavirus 2 NEGATIVE NEGATIVE Final    Comment: (NOTE) If result is NEGATIVE SARS-CoV-2 target nucleic acids are NOT DETECTED. The SARS-CoV-2 RNA is generally detectable in upper and lower  respiratory specimens during the acute phase of infection. The lowest  concentration of SARS-CoV-2 viral copies this assay can detect is 250  copies / mL. A negative result does not preclude SARS-CoV-2  infection  and should not be used as the sole basis for treatment or other  patient management decisions.  A negative result may occur with  improper specimen collection / handling, submission of specimen other  than nasopharyngeal swab, presence of viral mutation(s) within the  areas targeted by this assay, and inadequate number of viral copies  (<250 copies / mL). A negative result must be combined with clinical  observations, patient history, and epidemiological information. If result is POSITIVE SARS-CoV-2 target nucleic acids are DETECTED. The SARS-CoV-2 RNA is generally detectable in upper and lower  respiratory specimens dur ing the acute phase of infection.  Positive  results are indicative of active infection with SARS-CoV-2.  Clinical  correlation with patient history and other diagnostic information is  necessary to determine patient infection status.  Positive results do  not rule out bacterial infection or co-infection with other viruses. If result is PRESUMPTIVE POSTIVE SARS-CoV-2 nucleic acids MAY BE PRESENT.   A presumptive positive result was obtained on the submitted specimen  and confirmed on repeat testing.  While 2019 novel coronavirus  (SARS-CoV-2) nucleic acids may be present in the submitted sample  additional confirmatory testing may be necessary for epidemiological  and / or clinical management purposes  to differentiate between  SARS-CoV-2 and other Sarbecovirus currently known to infect humans.  If clinically indicated additional testing with an alternate test  methodology [phone]) is advised. The SARS-CoV-2 RNA is generally  detectable in upper and lower respiratory sp ecimens during the acute  phase of infection. The expected result is Negative. Fact Sheet for Patients:  StrictlyIdeas.no Fact Sheet for Healthcare Providers: BankingDealers.co.za This test is not yet approved or cleared by the [place]  FDA and has been authorized for detection and/or diagnosis of SARS-CoV-2 by FDA under an Emergency Use Authorization (EUA).  This EUA will remain in effect (meaning this test can be used) for the duration of the COVID-19 declaration under Section 564(b)(1) of the Act, 21 U.S.C. section 360bbb-3(b)(1), unless the authorization is terminated or revoked sooner. Performed at [facility], [place] [address], [place], [place] [address]      Labs: BNP (last 3 results) No results for input(s): BNP in the last 8760 hours. Basic Metabolic Panel: Recent Labs  Lab 06/19/18 0932 06/19/18 2242 06/20/18 1543 06/21/18 0347 06/22/18 0205  NA 137 137 139 139 138  K 3.2* 2.9* 3.2* 2.7* 3.1*  CL 104 101 99 100 102  CO2 22 24 28 26 29   GLUCOSE 108* 100* 88 82 82  BUN <5* <5* 7 <5* <5*  CREATININE 0.82 0.87 0.99 0.86 1.05*  CALCIUM 8.9 8.7* 8.0* 8.1* 8.4*  MG  --   --   --  2.0  --    Liver Function Tests: Recent Labs  Lab 06/18/18 1012 06/19/18 0932 06/19/18 2242 06/20/18 1543 06/21/18  0347  AST ALT ALKPHOS 73 65 60 53 57  BILITOT 0.7 0.6 0.5 1.4* 0.7  PROT 7.6 7.1 6.8 6.1* 6.0*  ALBUMIN 4.5 4.1 4.0 3.6 3.3*   Recent Labs  Lab 06/18/18 1012 06/19/18 0932 06/19/18 2242 06/21/18 0347  LIPASE No results for input(s): AMMONIA in the last 168 hours. CBC: Recent Labs  Lab 06/18/18 1012 06/19/18 0932 06/19/18 2242 06/20/18 1958 06/21/18 0347  WBC 6.0 9.6 7.4 7.3 7.7  NEUTROABS  --  6.8 4.7  --  4.2  HGB 14.3 13.5 13.7 13.6 13.5  HCT 42.9 39.6 40.7 41.0 38.4  MCV 95.5 93.6 93.8 95.6 91.9  PLT 241 235 236 191 211   Cardiac Enzymes: Recent Labs  Lab 06/20/18 1543  TROPONINI <0.03   BNP: Invalid input(s): POCBNP CBG: Recent Labs  Lab 06/21/18 0758 06/21/18 1016 06/22/18 0742  GLUCAP 66* 84 76   D-Dimer No results for input(s): DDIMER in the last 72 hours. Hgb A1c No results for input(s): HGBA1C in the last 72  hours. Lipid Profile No results for input(s): CHOL, HDL, LDLCALC, TRIG, CHOLHDL, LDLDIRECT in the last 72 hours. Thyroid function studies No results for input(s): TSH, T4TOTAL, T3FREE, THYROIDAB in the last 72 hours.  Invalid input(s): FREET3 Anemia work up No results for input(s): VITAMINB12, FOLATE, FERRITIN, TIBC, IRON, RETICCTPCT in the last 72 hours. Urinalysis    Component Value Date/Time   COLORURINE STRAW (A) 06/20/2018 1616   APPEARANCEUR CLEAR 06/20/2018 1616   LABSPEC 1.005 06/20/2018 1616   PHURINE 8.0 06/20/2018 1616   GLUCOSEU NEGATIVE 06/20/2018 1616   HGBUR SMALL (A) 06/20/2018 1616   BILIRUBINUR NEGATIVE 06/20/2018 1616   KETONESUR 20 (A) 06/20/2018 1616   PROTEINUR NEGATIVE 06/20/2018 1616   UROBILINOGEN 0.2 11/07/2014 1148   NITRITE NEGATIVE 06/20/2018 1616   LEUKOCYTESUR TRACE (A) 06/20/2018 1616   Sepsis Labs Invalid input(s): PROCALCITONIN,  WBC,  LACTICIDVEN Microbiology Recent Results (from the past 240 hour(s))  SARS Coronavirus 2 (CEPHEID - Performed in [facility] hospital lab), Hosp Order     Status: None   Collection Time: 06/19/18 10:42 PM  Result Value Ref Range Status   SARS Coronavirus 2 NEGATIVE NEGATIVE Final    Comment: (NOTE) If result is NEGATIVE SARS-CoV-2 target nucleic acids are NOT DETECTED. The SARS-CoV-2 RNA is generally detectable in upper and lower  respiratory specimens during the acute phase of infection. The lowest  concentration of SARS-CoV-2 viral copies this assay can detect is 250  copies / mL. A negative result does not preclude SARS-CoV-2 infection  and should not be used as the sole basis for treatment or other  patient management decisions.  A negative result may occur with  improper specimen collection / handling, submission of specimen other  than nasopharyngeal swab, presence of viral mutation(s) within the  areas targeted by this assay, and inadequate number of viral copies  (<250 copies / mL). A negative result  must be combined with clinical  observations, patient history, and epidemiological information. If result is POSITIVE SARS-CoV-2 target nucleic acids are DETECTED. The SARS-CoV-2 RNA is generally detectable in upper and lower  respiratory specimens dur ing the acute phase of infection.  Positive  results are indicative of active infection with SARS-CoV-2.  Clinical  correlation with patient history and other diagnostic information is  necessary to determine patient infection status.  Positive results do  not  rule out bacterial infection or co-infection with other viruses. If result is PRESUMPTIVE POSTIVE SARS-CoV-2 nucleic acids MAY BE PRESENT.   A presumptive positive result was obtained on the submitted specimen  and confirmed on repeat testing.  While 2019 novel coronavirus  (SARS-CoV-2) nucleic acids may be present in the submitted sample  additional confirmatory testing may be necessary for epidemiological  and / or clinical management purposes  to differentiate between  SARS-CoV-2 and other Sarbecovirus currently known to infect humans.  If clinically indicated additional testing with an alternate test  methodology [phone] is advised. The SARS-CoV-2 RNA is generally  detectable in upper and lower respiratory sp ecimens during the acute  phase of infection. The expected result is Negative. Fact Sheet for Patients:  BoilerBrush.com.cyhttps://www.fda.gov/media/136312/download Fact Sheet for Healthcare Providers: https://pope.com/https://www.fda.gov/media/136313/download This test is not yet approved or cleared by the [place] FDA and has been authorized for detection and/or diagnosis of SARS-CoV-2 by FDA under an Emergency Use Authorization (EUA).  This EUA will remain in effect (meaning this test can be used) for the duration of the COVID-19 declaration under Section 564(b)(1) of the Act, 21 U.S.C. section 360bbb-3(b)(1), unless the authorization is terminated or revoked sooner. Performed at [facility], [address] [place], [place] [address]    Time spent: 30min  SIGNED:   Rickey BarbaraStephen , MD  [facility] 06/22/2018, 3:07 PM  If 7PM-7AM, please contact night-coverage

## 2018-06-23 ENCOUNTER — Telehealth: Payer: Self-pay | Admitting: *Deleted

## 2018-06-23 NOTE — Telephone Encounter (Signed)
Patient called back and needed to reschedule the MRI. MRI moved to Friday 6/12, patient aware

## 2018-06-23 NOTE — Telephone Encounter (Signed)
Called and scheduled the MRI for the patient. Called and left the patient a message to call the office back, need to give the MRI information

## 2018-06-25 ENCOUNTER — Ambulatory Visit (HOSPITAL_COMMUNITY): Payer: Medicaid - Out of State

## 2018-06-26 ENCOUNTER — Telehealth: Payer: Self-pay | Admitting: *Deleted

## 2018-06-26 ENCOUNTER — Ambulatory Visit (HOSPITAL_COMMUNITY): Payer: Medicaid - Out of State

## 2018-06-26 NOTE — Telephone Encounter (Signed)
Patient's MRI appt for today was canceled due to not having her insurance approved yet. Per e mail from Gaspar Bidding [facility]) and April Pait (radiology) the patient will have the scan done in [place]. Called and spoke with the patient; patient stated that she will having everything done in [place]. (MRI/consultation) Appt canceled

## 2018-06-29 ENCOUNTER — Ambulatory Visit: Payer: Medicaid - Out of State | Admitting: Gynecologic Oncology

## 2019-01-15 DIAGNOSIS — Z9289 Personal history of other medical treatment: Secondary | ICD-10-CM

## 2019-01-15 HISTORY — DX: Personal history of other medical treatment: Z92.89

## 2019-01-21 ENCOUNTER — Emergency Department (HOSPITAL_COMMUNITY): Payer: Self-pay

## 2019-01-21 ENCOUNTER — Encounter (HOSPITAL_COMMUNITY): Payer: Self-pay

## 2019-01-21 ENCOUNTER — Other Ambulatory Visit: Payer: Self-pay

## 2019-01-21 ENCOUNTER — Emergency Department (HOSPITAL_COMMUNITY)
Admission: EM | Admit: 2019-01-21 | Discharge: 2019-01-21 | Disposition: A | Discharge status: Home / Self Care | Payer: Self-pay | Attending: Emergency Medicine | Admitting: Emergency Medicine

## 2019-01-21 DIAGNOSIS — Z87891 Personal history of nicotine dependence: Secondary | ICD-10-CM | POA: Insufficient documentation

## 2019-01-21 DIAGNOSIS — Z79899 Other long term (current) drug therapy: Secondary | ICD-10-CM | POA: Insufficient documentation

## 2019-01-21 DIAGNOSIS — J45909 Unspecified asthma, uncomplicated: Secondary | ICD-10-CM | POA: Insufficient documentation

## 2019-01-21 DIAGNOSIS — R112 Nausea with vomiting, unspecified: Secondary | ICD-10-CM | POA: Insufficient documentation

## 2019-01-21 DIAGNOSIS — R197 Diarrhea, unspecified: Secondary | ICD-10-CM | POA: Insufficient documentation

## 2019-01-21 DIAGNOSIS — R109 Unspecified abdominal pain: Secondary | ICD-10-CM | POA: Insufficient documentation

## 2019-01-21 DIAGNOSIS — R1900 Intra-abdominal and pelvic swelling, mass and lump, unspecified site: Secondary | ICD-10-CM | POA: Insufficient documentation

## 2019-01-21 LAB — COMPREHENSIVE METABOLIC PANEL
ALT: 19 U/L (ref 0–44)
AST: 21 U/L (ref 15–41)
Albumin: 5.5 g/dL — ABNORMAL HIGH (ref 3.5–5.0)
Alkaline Phosphatase: 72 U/L (ref 38–126)
Anion gap: 10 (ref 5–15)
BUN: 7 mg/dL (ref 6–20)
CO2: 29 mmol/L (ref 22–32)
Calcium: 10.3 mg/dL (ref 8.9–10.3)
Chloride: 101 mmol/L (ref 98–111)
Creatinine, Ser: 0.85 mg/dL (ref 0.44–1.00)
GFR calc Af Amer: >60 mL/min (ref >60)
GFR calc non Af Amer: >60 mL/min (ref >60)
Glucose, Bld: 117 mg/dL — ABNORMAL HIGH (ref 70–99)
Potassium: 3.9 mmol/L (ref 3.5–5.1)
Sodium: 140 mmol/L (ref 135–145)
Total Bilirubin: 0.6 mg/dL (ref 0.3–1.2)
Total Protein: 9.5 g/dL — ABNORMAL HIGH (ref 6.5–8.1)

## 2019-01-21 LAB — I-STAT BETA HCG BLOOD, ED (MC, WL, AP ONLY): I-stat hCG, quantitative: <5 m[IU]/mL (ref <5)

## 2019-01-21 LAB — CBC WITH DIFFERENTIAL/PLATELET
Abs Immature Granulocytes: 0.02 10*3/uL (ref 0.00–0.07)
Basophils Absolute: 0.1 10*3/uL (ref 0.0–0.1)
Basophils Relative: 1 %
Eosinophils Absolute: 0 10*3/uL (ref 0.0–0.5)
Eosinophils Relative: 0 %
HCT: 50.6 % — ABNORMAL HIGH (ref 36.0–46.0)
Hemoglobin: 16.4 g/dL — ABNORMAL HIGH (ref 12.0–15.0)
Immature Granulocytes: 0 %
Lymphocytes Relative: 21 %
Lymphs Abs: 1.9 10*3/uL (ref 0.7–4.0)
MCH: 31.8 pg (ref 26.0–34.0)
MCHC: 32.4 g/dL (ref 30.0–36.0)
MCV: 98.1 fL (ref 80.0–100.0)
Monocytes Absolute: 0.5 10*3/uL (ref 0.1–1.0)
Monocytes Relative: 6 %
Neutro Abs: 6.6 10*3/uL (ref 1.7–7.7)
Neutrophils Relative %: 72 %
Platelets: 267 10*3/uL (ref 150–400)
RBC: 5.16 MIL/uL — ABNORMAL HIGH (ref 3.87–5.11)
RDW: 13 % (ref 11.5–15.5)
WBC: 9.1 10*3/uL (ref 4.0–10.5)
nRBC: 0 % (ref 0.0–0.2)

## 2019-01-21 LAB — LIPASE, BLOOD: Lipase: 21 U/L (ref 11–51)

## 2019-01-21 MED ORDER — FENTANYL CITRATE (PF) 100 MCG/2ML IJ SOLN
50.0000 ug | Freq: Once | INTRAMUSCULAR | Status: AC
  Administered 2019-01-21: 50 ug via INTRAVENOUS
  Filled 2019-01-21: qty 2

## 2019-01-21 MED ORDER — SODIUM CHLORIDE 0.9 % IV BOLUS
1000.0000 mL | Freq: Once | INTRAVENOUS | Status: AC
  Administered 2019-01-21: 1000 mL via INTRAVENOUS

## 2019-01-21 MED ORDER — PROMETHAZINE HCL 25 MG PO TABS: 25.0000 mg | ORAL_TABLET | Freq: Three times a day (TID) | ORAL | 0 refills | Status: DC | PRN

## 2019-01-21 MED ORDER — DIPHENHYDRAMINE HCL 50 MG/ML IJ SOLN
25.0000 mg | Freq: Once | INTRAMUSCULAR | Status: AC
  Administered 2019-01-21: 14:00:00 25 mg via INTRAVENOUS
  Filled 2019-01-21: qty 1

## 2019-01-21 MED ORDER — SODIUM CHLORIDE (PF) 0.9 % IJ SOLN
INTRAMUSCULAR | Status: AC
  Filled 2019-01-21: qty 50

## 2019-01-21 MED ORDER — ONDANSETRON HCL 4 MG/2ML IJ SOLN
4.0000 mg | Freq: Once | INTRAMUSCULAR | Status: AC
  Administered 2019-01-21: 4 mg via INTRAVENOUS
  Filled 2019-01-21: qty 2

## 2019-01-21 MED ORDER — HALOPERIDOL LACTATE 5 MG/ML IJ SOLN
2.0000 mg | Freq: Once | INTRAMUSCULAR | Status: AC
  Administered 2019-01-21: 2 mg via INTRAVENOUS
  Filled 2019-01-21: qty 1

## 2019-01-21 MED ORDER — IOHEXOL 300 MG/ML  SOLN
100.0000 mL | Freq: Once | INTRAMUSCULAR | Status: AC | PRN
  Administered 2019-01-21: 100 mL via INTRAVENOUS

## 2019-01-21 NOTE — ED Triage Notes (Signed)
Pt BIB EMS from home. Pt c/o N/V for last few days. Pt has hx of N/V with unknown cause. Pt denies pregnancy. Pt states she took nausea meds and pepto with no relief. Pt denies SOB and chest pain.

## 2019-01-21 NOTE — ED Provider Notes (Signed)
[facility]-EMERGENCY DEPT Provider Note   CSN: [id number] Arrival date & time: 01/21/19  1250     History Chief Complaint  Patient presents with  . Nausea  . Emesis    Krista Stout is a 29 y.o. female.  The history is provided by the patient.  Emesis Severity:  Moderate Timing:  Constant Able to tolerate:  Liquids Progression:  Unchanged Chronicity:  Recurrent Recent urination:  Normal Relieved by:  Nothing Worsened by:  Nothing Ineffective treatments:  Antiemetics Associated symptoms: abdominal pain and diarrhea   Associated symptoms: no arthralgias, no chills, no cough, no fever, no headaches, no myalgias, no sore throat and no URI   Risk factors: prior abdominal surgery (hernia repair)   Risk factors comment:  Doctor from home has been following a pelvic mass      Past Medical History:  Diagnosis Date  . Asthma   . Bacterial vaginitis     Patient Active Problem List   Diagnosis Date Noted  . Intractable nausea and vomiting 06/20/2018  . Hypokalemia 06/20/2018  . Chest pain 06/20/2018  . Abdominal pain with vomiting 06/20/2018  . Asthma with status asthmaticus   . Pelvic mass 06/19/2018    Past Surgical History:  Procedure Laterality Date  . HERNIA REPAIR       OB History    Gravida  1   Para      Term      Preterm      AB      Living        SAB      TAB      Ectopic      Multiple      Live Births              History reviewed. No pertinent family history.  Social History   Tobacco Use  . Smoking status: Former Games developer  . Smokeless tobacco: Never Used  Substance Use Topics  . Alcohol use: No  . Drug use: No    Types: Marijuana    Comment: former    Home Medications Prior to Admission medications   Medication Sig Start Date End Date Taking? Authorizing Provider  dicyclomine (BENTYL) 20 MG tablet Take 1 tablet (20 mg total) by mouth 2 (two) times daily. 06/19/18   Arthor Captain, PA-C    HYDROcodone-acetaminophen (NORCO/VICODIN) 5-325 MG tablet Take 1-2 tablets by mouth every 6 (six) hours as needed. 06/20/18   Roxy Horseman, PA-C  ondansetron (ZOFRAN ODT) 4 MG disintegrating tablet Take 1 tablet (4 mg total) by mouth every 8 (eight) hours as needed for nausea or vomiting. 06/18/18   Bethel Born, PA-C  promethazine (PHENERGAN) 25 MG tablet Take 1 tablet (25 mg total) by mouth every 8 (eight) hours as needed for up to 15 doses for nausea or vomiting. 01/21/19   Byrne Capek, DO  traMADol (ULTRAM) 50 MG tablet Take 1 tablet (50 mg total) by mouth every 6 (six) hours as needed. 06/19/18   Arthor Captain, PA-C    Allergies    Patient has no known allergies.  Review of Systems   Review of Systems  Constitutional: Negative for chills and fever.  HENT: Negative for ear pain and sore throat.   Eyes: Negative for pain and visual disturbance.  Respiratory: Negative for cough and shortness of breath.   Cardiovascular: Negative for chest pain and palpitations.  Gastrointestinal: Positive for abdominal pain, diarrhea, nausea and vomiting.  Genitourinary: Negative for decreased  urine volume, difficulty urinating, dysuria, flank pain, frequency, hematuria, pelvic pain, urgency, vaginal bleeding, vaginal discharge and vaginal pain.  Musculoskeletal: Negative for arthralgias, back pain and myalgias.  Skin: Negative for color change and rash.  Neurological: Negative for seizures, syncope and headaches.  All other systems reviewed and are negative.   Physical Exam Updated Vital Signs  ED Triage Vitals [01/21/19 1259]  Enc Vitals Group     BP (!) 111/94     Pulse Rate 94     Resp 20     Temp 98.3 F (36.8 C)     Temp Source Oral     SpO2      Weight 130 lb (59 kg)     Height 5\' 10"  (1.778 m)     Head Circumference      Peak Flow      Pain Score      Pain Loc      Pain Edu?      Excl. in GC?      Physical Exam Vitals and nursing note reviewed.  Constitutional:       General: She is not in acute distress.    Appearance: She is well-developed. She is ill-appearing.  HENT:     Head: Normocephalic and atraumatic.     Nose: Nose normal.     Mouth/Throat:     Mouth: Mucous membranes are moist.  Eyes:     Extraocular Movements: Extraocular movements intact.     Conjunctiva/sclera: Conjunctivae normal.     Pupils: Pupils are equal, round, and reactive to light.  Cardiovascular:     Rate and Rhythm: Normal rate and regular rhythm.     Pulses: Normal pulses.     Heart sounds: Normal heart sounds. No murmur.  Pulmonary:     Effort: Pulmonary effort is normal. No respiratory distress.     Breath sounds: Normal breath sounds.  Abdominal:     General: There is no distension.     Palpations: Abdomen is soft. There is no mass.     Tenderness: There is abdominal tenderness (diffuse). There is no guarding or rebound.     Hernia: No hernia is present.  Musculoskeletal:     Cervical back: Normal range of motion and neck supple.  Skin:    General: Skin is warm and dry.  Neurological:     General: No focal deficit present.     Mental Status: She is alert.  Psychiatric:        Mood and Affect: Mood normal.     ED Results / Procedures / Treatments   Labs (all labs ordered are listed, but only abnormal results are displayed) Labs Reviewed  CBC WITH DIFFERENTIAL/PLATELET - Abnormal; Notable for the following components:      Result Value   RBC 5.16 (*)    Hemoglobin 16.4 (*)    HCT 50.6 (*)    All other components within normal limits  COMPREHENSIVE METABOLIC PANEL - Abnormal; Notable for the following components:   Glucose, Bld 117 (*)    Total Protein 9.5 (*)    Albumin 5.5 (*)    All other components within normal limits  LIPASE, BLOOD  I-STAT BETA HCG BLOOD, ED ([facility], [facility], [facility] ONLY)    EKG None  Radiology CT ABDOMEN PELVIS W CONTRAST  Result Date: 01/21/2019 CLINICAL DATA:  Abdominal distension, nausea, vomiting EXAM: CT ABDOMEN AND PELVIS WITH  CONTRAST TECHNIQUE: Multidetector CT imaging of the abdomen and pelvis was performed using the standard  protocol following bolus administration of intravenous contrast. CONTRAST:  166mL OMNIPAQUE IOHEXOL 300 MG/ML  SOLN COMPARISON:  06/19/2018 FINDINGS: Lower chest: No acute abnormality. Hepatobiliary: No focal liver abnormality is seen. No gallstones, gallbladder wall thickening, or biliary dilatation. Pancreas: Unremarkable. No pancreatic ductal dilatation or surrounding inflammatory changes. Spleen: Normal in size without focal abnormality. Adrenals/Urinary Tract: Adrenal glands are unremarkable. Kidneys are normal, without renal calculi, focal lesion, or hydronephrosis. Urinary bladder is incompletely distended, limiting its evaluation. Stomach/Bowel: Stomach is within normal limits. Appendix appears normal. No evidence of bowel wall thickening, distention, or inflammatory changes. Vascular/Lymphatic: No significant vascular findings are present. No enlarged abdominal or pelvic lymph nodes. Reproductive: Uterus is within normal limits. 1.6 cm right adnexal cyst with rim enhancement likely a hemorrhagic cyst. There is a small volume of free fluid within the pelvis. Other: Redemonstration of a heterogeneous, solid mass within the pre rectal space of the pelvis measuring 6.5 x 4.8 x 4.6 cm (series 2, image 59; series 5, image 79). This mass measured approximately 5.1 x 3.1 x 3.3 cm on 06/19/2018. Musculoskeletal: No acute or significant osseous findings. No lytic or sclerotic bony lesion. IMPRESSION: 1. Significant interval enlargement of a heterogeneous, solid mass within the pre-rectal space of the pelvis measuring 6.5 x 4.8 x 4.6 cm, previously 5.1 x 3.1 x 3.3 cm on 06/19/2018. Findings are concerning for malignancy given the rapid interval growth. Further evaluation with pre and post-contrast MRI of the pelvis is recommended. 2. Small volume of free fluid within the pelvis. 3. 1.6 cm right adnexal cyst with  thin peripheral enhancement, likely a hemorrhagic cyst. Electronically Signed   By: Davina Poke D.O.   On: 01/21/2019 15:13    Procedures Procedures (including critical care time)  Medications Ordered in ED Medications  sodium chloride (PF) 0.9 % injection (has no administration in time range)  sodium chloride 0.9 % bolus 1,000 mL (0 mLs Intravenous Stopped 01/21/19 1532)  haloperidol lactate (HALDOL) injection 2 mg (2 mg Intravenous Given 01/21/19 1349)  diphenhydrAMINE (BENADRYL) injection 25 mg (25 mg Intravenous Given 01/21/19 1349)  fentaNYL (SUBLIMAZE) injection 50 mcg (50 mcg Intravenous Given 01/21/19 1431)  ondansetron (ZOFRAN) injection 4 mg (4 mg Intravenous Given 01/21/19 1431)  sodium chloride 0.9 % bolus 1,000 mL (1,000 mLs Intravenous Bolus from Bag 01/21/19 1431)  iohexol (OMNIPAQUE) 300 MG/ML solution 100 mL (100 mLs Intravenous Contrast Given 01/21/19 1441)    ED Course  I have reviewed the triage vital signs and the nursing notes.  Pertinent labs & imaging results that were available during my care of the patient were reviewed by me and considered in my medical decision making (see chart for details).    MDM Rules/Calculators/A&P  HENCHY MCCAULEY is a 29 year old female with history of asthma, intractable nausea vomiting who presents the ED with nausea vomiting diarrhea for the last couple days.  Has history of the same.  States that she is following with a doctor back in [place] for pelvic mass.  Patient has diffuse abdominal pain on exam.  She has nausea and vomiting currently as well.  Used to smoke marijuana but stopped several months ago when she thought that this could be the reason why she was having these episodes.  Has had a hernia surgery in the past.  Will evaluate with lab work, urinalysis, CT scan abdomen pelvis.  She denies any vaginal discharge or bleeding.  Denies pregnancy.  Denies any urinary symptoms.  Possible cyclical vomiting syndrome versus bowel  obstruction versus appendicitis versus pelvic type pain.  Patient with no significant leukocytosis, anemia, electrolyte abnormality.  Pregnancy test is negative.  Patient felt much improved after IV fluids, IV Haldol, fentanyl, Zofran.  CT scan showed interval enlargement of solid mass within her prerectal space when compared to imaging back in June of this year.  These findings are concerning for malignancy given the rapid growth.  Otherwise CT scan was unremarkable.  Patient is well aware of this mass.  She has had MRI and ultrasounds by her OB/GYN back in [place] where she has her primary care.  I stressed the importance of a close follow-up as patient likely needs biopsy of this lesion at this time.  She states that that was likely the plan given the findings on her repeat imaging that was supposed to be done fairly soon.  Will prescribe Phenergan.  Discharged from the ED in good condition.  She understands return precautions.  She understands the urgency for very close follow-up with her OB/GYN.  This chart was dictated using voice recognition software.  Despite best efforts to proofread,  errors can occur which can change the documentation meaning.    Final Clinical Impression(s) / ED Diagnoses Final diagnoses:  Nausea vomiting and diarrhea  Pelvic mass    Rx / DC Orders ED Discharge Orders         Ordered    promethazine (PHENERGAN) 25 MG tablet  Every 8 hours PRN     01/21/19 1549           Courtni Balash, DO 01/21/19 1549

## 2019-01-21 NOTE — Discharge Instructions (Signed)
Follow-up with your OB/GYN as discussed as you likely need biopsy/repeat MRI of your pelvic mass. This concerning for cancer/malignancy. Please use Phenergan for nausea and vomiting.  Please return to the ED if her symptoms worsen.

## 2019-01-21 NOTE — ED Notes (Signed)
An After Visit Summary was printed and given to the patient. Discharge instructions given and no further questions at this time.  

## 2019-01-21 NOTE — ED Notes (Signed)
ED Provider at bedside. 

## 2019-06-17 ENCOUNTER — Other Ambulatory Visit: Payer: Self-pay | Admitting: Gynecologic Oncology

## 2019-06-17 DIAGNOSIS — R1900 Intra-abdominal and pelvic swelling, mass and lump, unspecified site: Secondary | ICD-10-CM

## 2020-01-15 HISTORY — PX: LAPAROSCOPIC OOPHERECTOMY: SHX6507

## 2020-11-14 IMAGING — CT CT ABDOMEN AND PELVIS WITH CONTRAST
2 of 7 series · 14 of 46 positions shown, 18 images · IV contrast (APPLIED)
Comparison: None.

CLINICAL DATA: Acute generalized abdominal pain.

EXAM:
CT ABDOMEN AND PELVIS WITH CONTRAST
TECHNIQUE: Multidetector CT imaging of the abdomen and pelvis was performed
using the standard protocol following bolus administration of
intravenous contrast.
CONTRAST:  100mL OMNIPAQUE IOHEXOL 300 MG/ML  SOLN

[Series 3: abd/ pelvis 5.0 i30f 2 · axial · 0.93mm/px · z∈[-484,-104]mm · 11 of 88 slices shown, 15 images]
[im 8/88  soft-tissue]
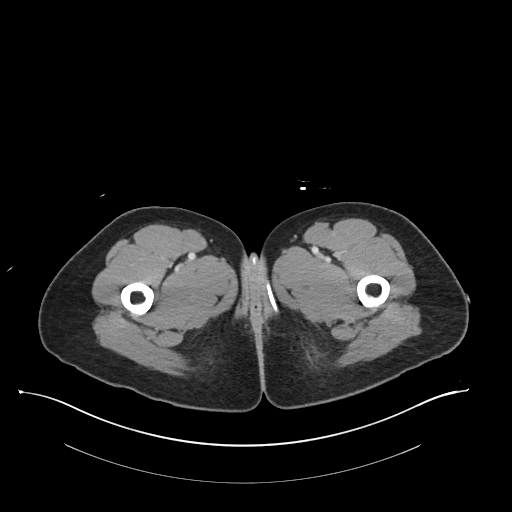
[im 8/88  bone]
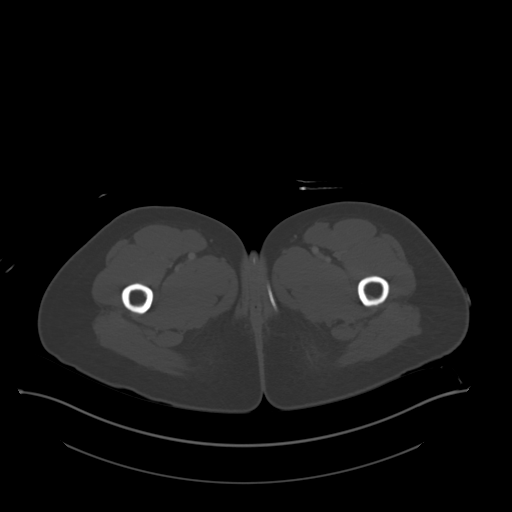
[im 16/88  soft-tissue]
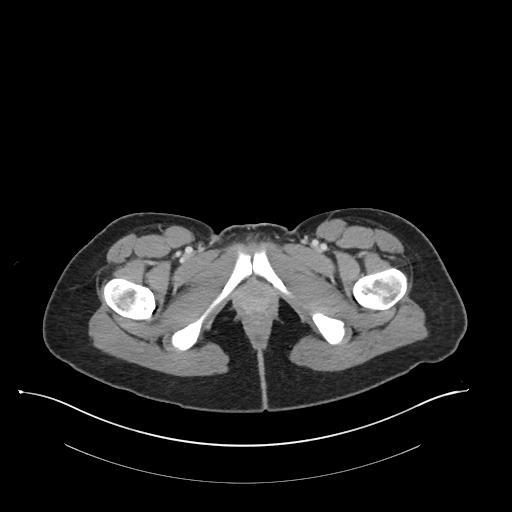
[im 24/88  soft-tissue]
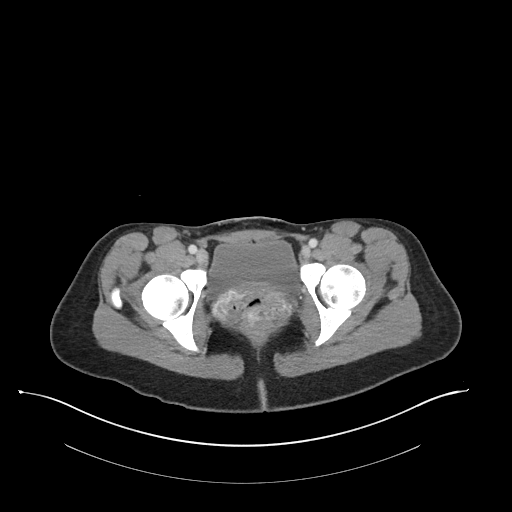
[im 36/88  soft-tissue]
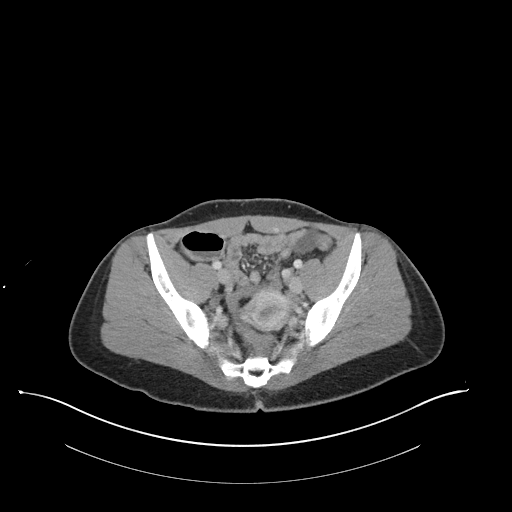
[im 44/88  soft-tissue]
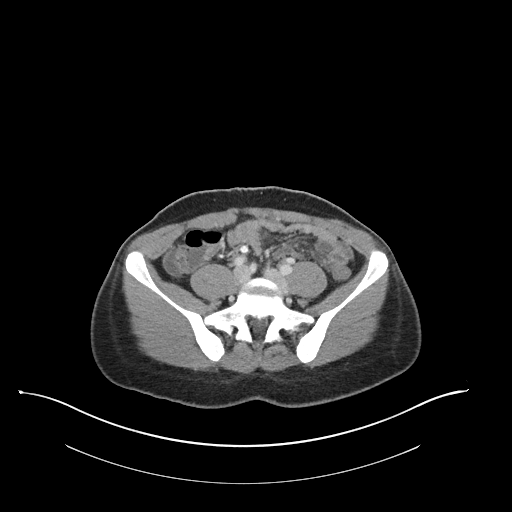
[im 52/88  soft-tissue]
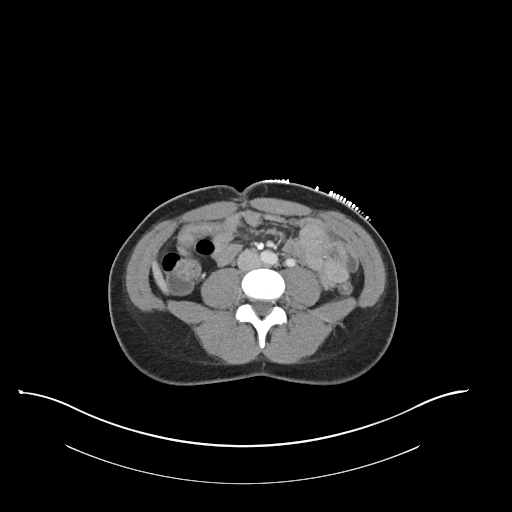
[im 64/88  soft-tissue]
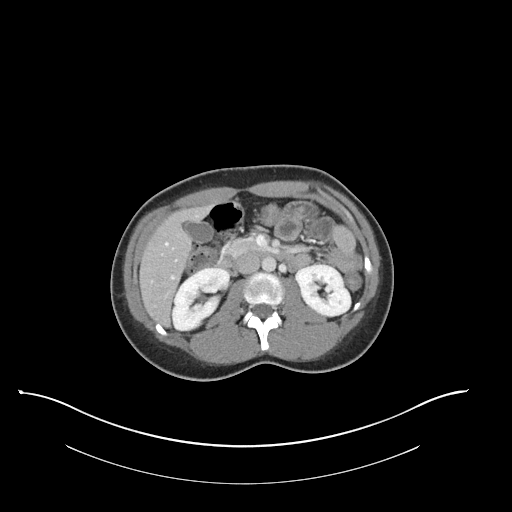
[im 72/88  soft-tissue]
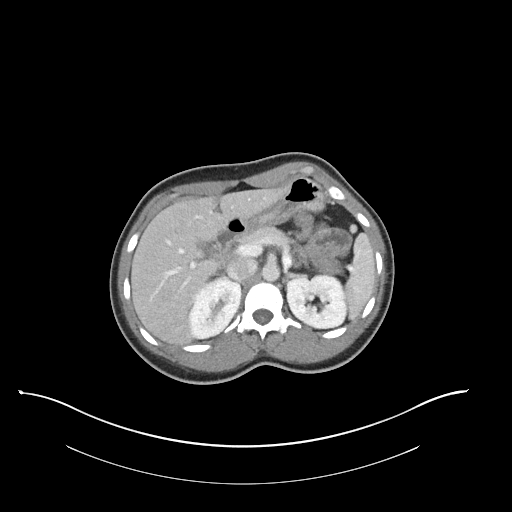
[im 72/88  lung]
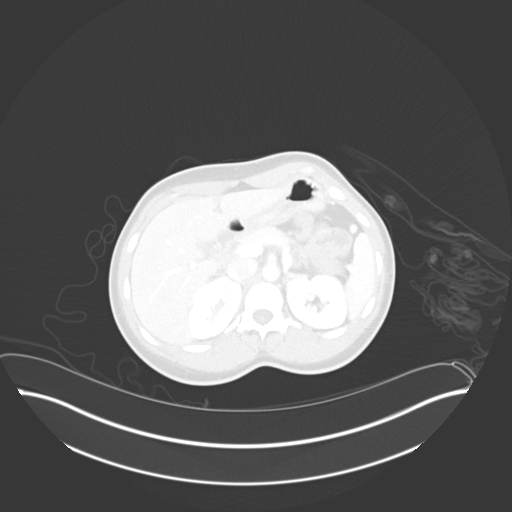
[im 76/88  lung]
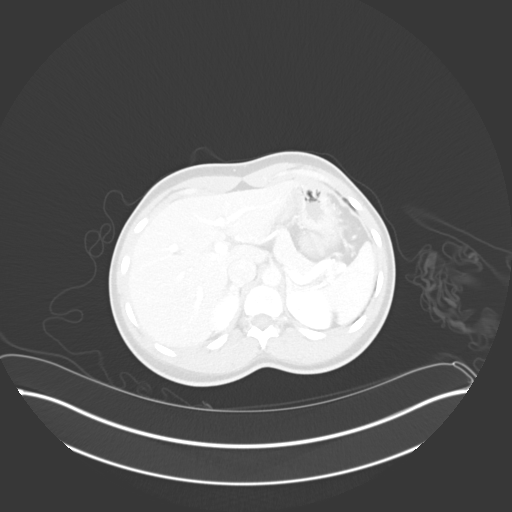
[im 80/88  soft-tissue]
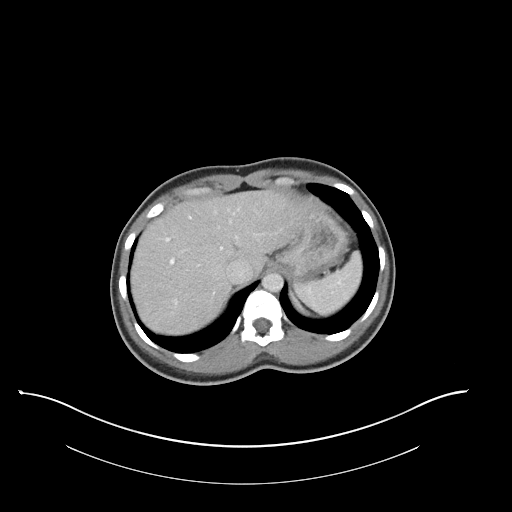
[im 80/88  lung]
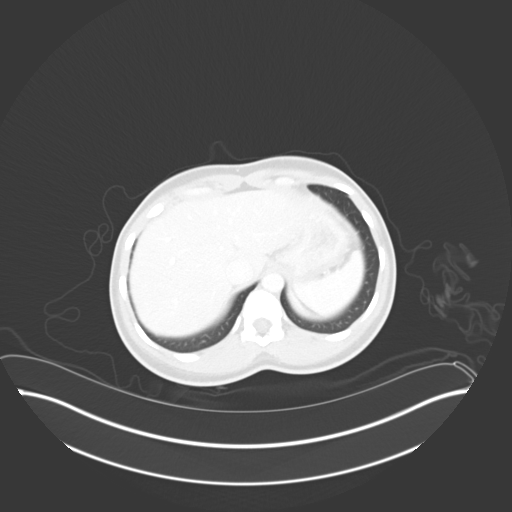
[im 80/88  bone]
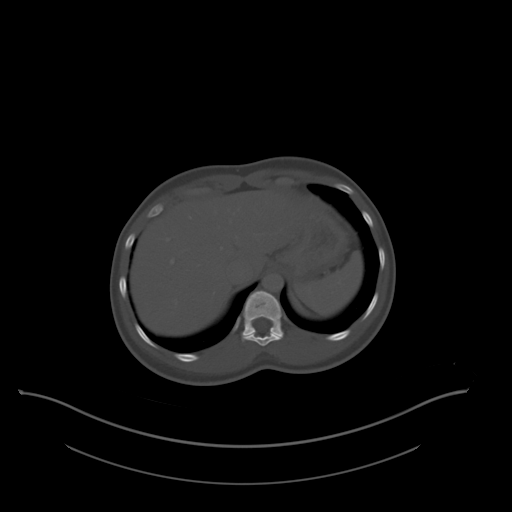
[im 84/88  lung]
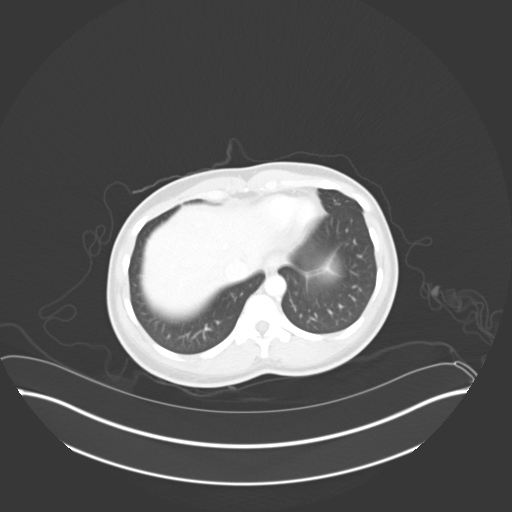

[Series 9: coronal soft tissue · coronal · 0.69mm/px · 3 of 95 slices shown]
[im 24/95  soft-tissue]
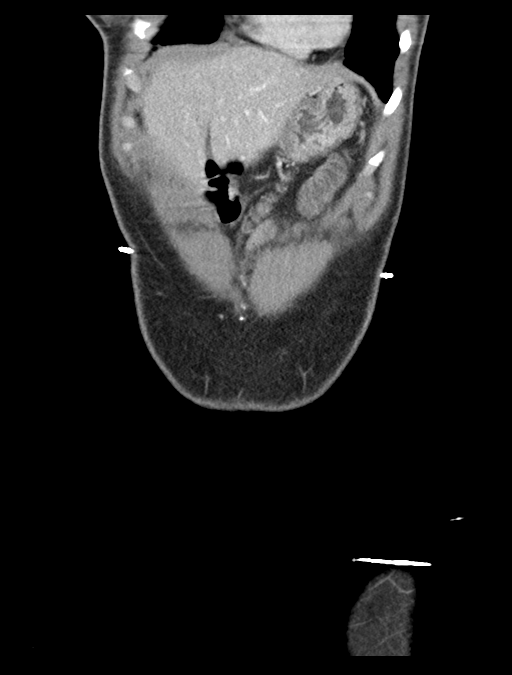
[im 48/95  soft-tissue]
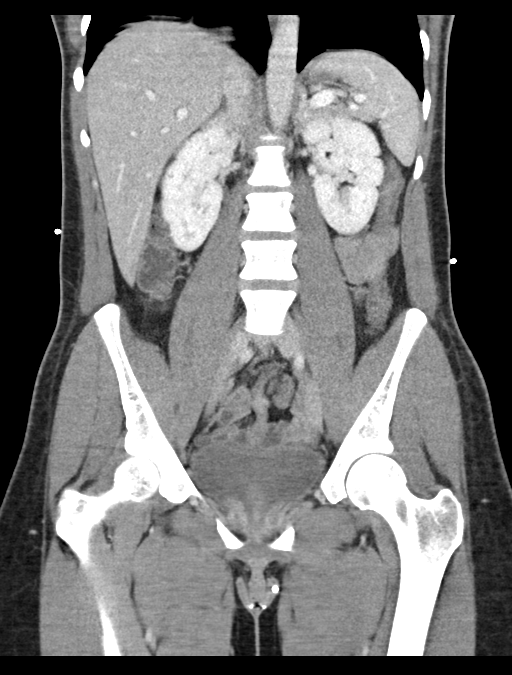
[im 71/95  soft-tissue]
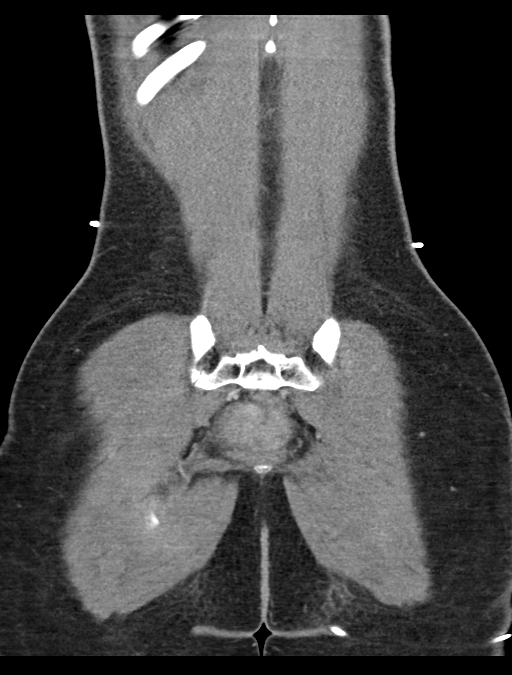

[14 of 46 positions shown; findings below may reference images not displayed]

FINDINGS: Lower chest: No acute abnormality.

Hepatobiliary: No focal liver abnormality is seen. No gallstones,
gallbladder wall thickening, or biliary dilatation.

Pancreas: Unremarkable. No pancreatic ductal dilatation or
surrounding inflammatory changes.

Spleen: Normal in size without focal abnormality.

Adrenals/Urinary Tract: Adrenal glands are unremarkable. Kidneys are
normal, without renal calculi, focal lesion, or hydronephrosis.
Bladder is unremarkable.

Stomach/Bowel: Stomach is within normal limits. Appendix appears
normal. No evidence of bowel wall thickening, distention, or
inflammatory changes.

Vascular/Lymphatic: No significant vascular findings are present. No
enlarged abdominal or pelvic lymph nodes.

Reproductive: Uterus and left adnexal region are unremarkable. 5.3 x
3.6 cm oval-shaped solid mass or abnormality is noted in the pre
rectal space of the pelvis which may represent ovarian or adnexal
abnormality. Small amount of free fluid is seen in this area as
well.

Other: No definite hernia is noted.

Musculoskeletal: No acute or significant osseous findings.
IMPRESSION: 5.3 x 3.6 cm oval-shaped solid abnormality or mass is noted in the
pre rectal space of the pelvis with mild amount of surrounding free
fluid. Potentially this may represent ovarian or adnexal neoplasm,
and pelvic ultrasound is recommended for further evaluation. If
pelvic ultrasound does not demonstrate this abnormality, further
evaluation with MRI would then be recommended.

## 2020-11-15 IMAGING — DX PORTABLE CHEST - 1 VIEW
1 series · 1 of 1 positions shown · non-contrast
Comparison: No priors.

CLINICAL DATA: 20-year-old female with history of chest pain.

EXAM:
PORTABLE CHEST 1 VIEW

[chest ap]
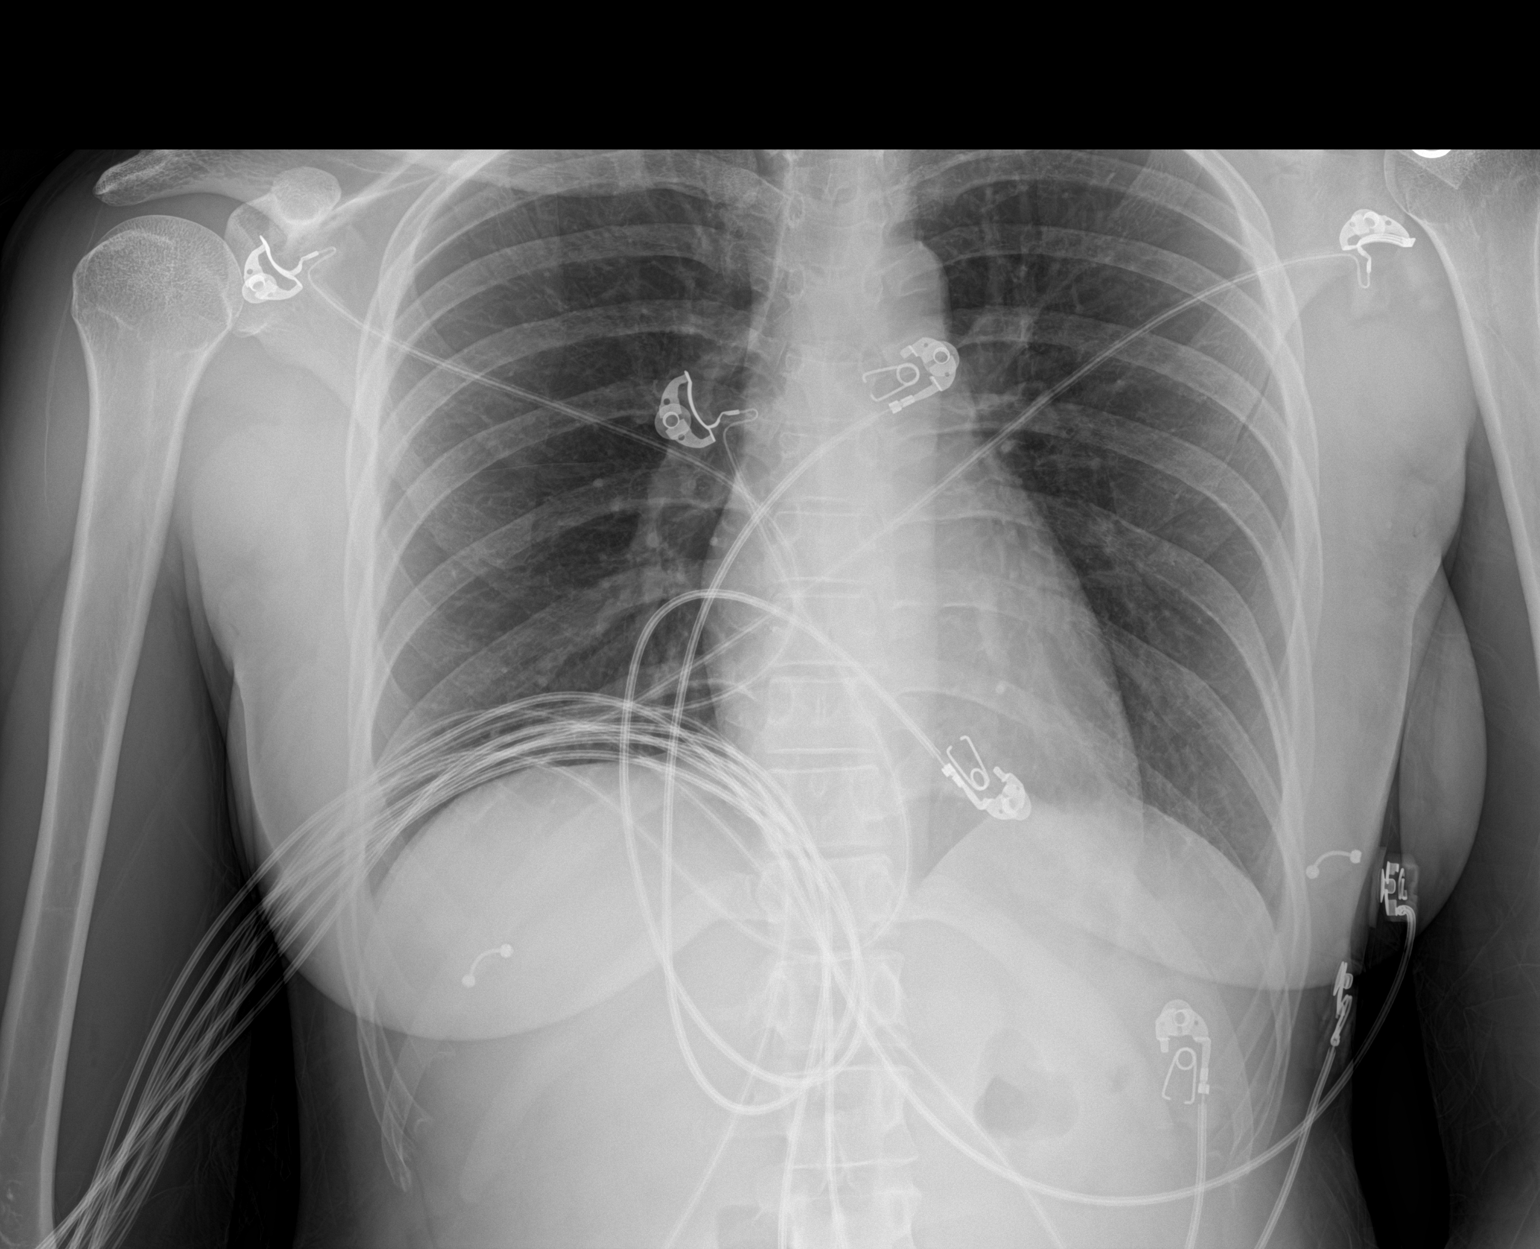

[1 of 1 positions shown; findings below may reference images not displayed]

FINDINGS: Lung volumes are normal. No consolidative airspace disease. No
pleural effusions. No pneumothorax. No pulmonary nodule or mass
noted. Pulmonary vasculature and the cardiomediastinal silhouette
are within normal limits.
IMPRESSION: No radiographic evidence of acute cardiopulmonary disease.

## 2022-03-01 ENCOUNTER — Inpatient Hospital Stay (HOSPITAL_COMMUNITY): Payer: Medicaid - Out of State

## 2022-03-01 ENCOUNTER — Other Ambulatory Visit: Payer: Self-pay

## 2022-03-01 ENCOUNTER — Encounter (HOSPITAL_COMMUNITY): Payer: Self-pay

## 2022-03-01 ENCOUNTER — Inpatient Hospital Stay (HOSPITAL_COMMUNITY)
Admission: AD | Admit: 2022-03-01 | Discharge: 2022-03-06 | DRG: 832 | Disposition: A | Discharge status: Home / Self Care | Payer: Medicaid - Out of State | Attending: Obstetrics and Gynecology | Admitting: Obstetrics and Gynecology

## 2022-03-01 DIAGNOSIS — Z3A01 Less than 8 weeks gestation of pregnancy: Secondary | ICD-10-CM | POA: Diagnosis not present

## 2022-03-01 DIAGNOSIS — R1110 Vomiting, unspecified: Secondary | ICD-10-CM | POA: Diagnosis not present

## 2022-03-01 DIAGNOSIS — R1900 Intra-abdominal and pelvic swelling, mass and lump, unspecified site: Secondary | ICD-10-CM | POA: Diagnosis present

## 2022-03-01 DIAGNOSIS — O210 Mild hyperemesis gravidarum: Secondary | ICD-10-CM | POA: Diagnosis present

## 2022-03-01 DIAGNOSIS — Z87891 Personal history of nicotine dependence: Secondary | ICD-10-CM

## 2022-03-01 DIAGNOSIS — J45909 Unspecified asthma, uncomplicated: Secondary | ICD-10-CM | POA: Diagnosis present

## 2022-03-01 DIAGNOSIS — E876 Hypokalemia: Secondary | ICD-10-CM

## 2022-03-01 DIAGNOSIS — O211 Hyperemesis gravidarum with metabolic disturbance: Secondary | ICD-10-CM | POA: Diagnosis present

## 2022-03-01 DIAGNOSIS — O10011 Pre-existing essential hypertension complicating pregnancy, first trimester: Secondary | ICD-10-CM | POA: Diagnosis present

## 2022-03-01 DIAGNOSIS — Z789 Other specified health status: Secondary | ICD-10-CM | POA: Diagnosis not present

## 2022-03-01 DIAGNOSIS — O99511 Diseases of the respiratory system complicating pregnancy, first trimester: Secondary | ICD-10-CM | POA: Diagnosis present

## 2022-03-01 DIAGNOSIS — R112 Nausea with vomiting, unspecified: Principal | ICD-10-CM

## 2022-03-01 HISTORY — DX: Cyclical vomiting syndrome unrelated to migraine: R11.15

## 2022-03-01 LAB — COMPREHENSIVE METABOLIC PANEL
ALT: 28 U/L (ref 0–44)
AST: 26 U/L (ref 15–41)
Albumin: 4.3 g/dL (ref 3.5–5.0)
Alkaline Phosphatase: 56 U/L (ref 38–126)
Anion gap: 11 (ref 5–15)
BUN: 6 mg/dL (ref 6–20)
CO2: 24 mmol/L (ref 22–32)
Calcium: 9.2 mg/dL (ref 8.9–10.3)
Chloride: 100 mmol/L (ref 98–111)
Creatinine, Ser: 0.89 mg/dL (ref 0.44–1.00)
GFR, Estimated: >60 mL/min (ref >60)
Glucose, Bld: 117 mg/dL — ABNORMAL HIGH (ref 70–99)
Potassium: 2.8 mmol/L — ABNORMAL LOW (ref 3.5–5.1)
Sodium: 135 mmol/L (ref 135–145)
Total Bilirubin: 0.5 mg/dL (ref 0.3–1.2)
Total Protein: 7.7 g/dL (ref 6.5–8.1)

## 2022-03-01 LAB — CBC WITH DIFFERENTIAL/PLATELET
Abs Immature Granulocytes: 0.02 10*3/uL (ref 0.00–0.07)
Basophils Absolute: 0 10*3/uL (ref 0.0–0.1)
Basophils Relative: 0 %
Eosinophils Absolute: 0.2 10*3/uL (ref 0.0–0.5)
Eosinophils Relative: 2 %
HCT: 44.6 % (ref 36.0–46.0)
Hemoglobin: 14.8 g/dL (ref 12.0–15.0)
Immature Granulocytes: 0 %
Lymphocytes Relative: 34 %
Lymphs Abs: 3.2 10*3/uL (ref 0.7–4.0)
MCH: 31.5 pg (ref 26.0–34.0)
MCHC: 33.2 g/dL (ref 30.0–36.0)
MCV: 94.9 fL (ref 80.0–100.0)
Monocytes Absolute: 1 10*3/uL (ref 0.1–1.0)
Monocytes Relative: 10 %
Neutro Abs: 5 10*3/uL (ref 1.7–7.7)
Neutrophils Relative %: 54 %
Platelets: 324 10*3/uL (ref 150–400)
RBC: 4.7 MIL/uL (ref 3.87–5.11)
RDW: 13.2 % (ref 11.5–15.5)
WBC: 9.3 10*3/uL (ref 4.0–10.5)
nRBC: 0 % (ref 0.0–0.2)

## 2022-03-01 LAB — URINALYSIS, ROUTINE W REFLEX MICROSCOPIC
Bilirubin Urine: NEGATIVE
Glucose, UA: 150 mg/dL — AB
Hgb urine dipstick: NEGATIVE
Ketones, ur: 80 mg/dL — AB
Leukocytes,Ua: NEGATIVE
Nitrite: NEGATIVE
Protein, ur: NEGATIVE mg/dL
Specific Gravity, Urine: 1.016 (ref 1.005–1.030)
pH: 7 (ref 5.0–8.0)

## 2022-03-01 LAB — RAPID URINE DRUG SCREEN, HOSP PERFORMED
Amphetamines: NOT DETECTED
Barbiturates: NOT DETECTED
Benzodiazepines: NOT DETECTED
Cocaine: NOT DETECTED
Opiates: NOT DETECTED
Tetrahydrocannabinol: POSITIVE — AB

## 2022-03-01 LAB — TYPE AND SCREEN
ABO/RH(D): O POS
Antibody Screen: NEGATIVE

## 2022-03-01 LAB — T4, FREE: Free T4: 1.03 ng/dL (ref 0.61–1.12)

## 2022-03-01 LAB — HCG, QUANTITATIVE, PREGNANCY: hCG, Beta Chain, Quant, S: 28410 m[IU]/mL — ABNORMAL HIGH (ref <5)

## 2022-03-01 LAB — ABO/RH: ABO/RH(D): O POS

## 2022-03-01 LAB — TSH: TSH: 0.24 u[IU]/mL — ABNORMAL LOW (ref 0.350–4.500)

## 2022-03-01 LAB — LIPASE, BLOOD: Lipase: 39 U/L (ref 11–51)

## 2022-03-01 LAB — I-STAT BETA HCG BLOOD, ED (MC, WL, AP ONLY): I-stat hCG, quantitative: >2000 m[IU]/mL — ABNORMAL HIGH (ref <5)

## 2022-03-01 MED ORDER — ADULT MULTIVITAMIN W/MINERALS CH: 1.0000 | ORAL_TABLET | Freq: Every day | ORAL | Status: DC

## 2022-03-01 MED ORDER — DIPHENHYDRAMINE HCL 50 MG/ML IJ SOLN
25.0000 mg | Freq: Four times a day (QID) | INTRAMUSCULAR | Status: DC
  Administered 2022-03-01 – 2022-03-06 (×19): 25 mg via INTRAVENOUS
  Filled 2022-03-01 (×19): qty 1

## 2022-03-01 MED ORDER — SCOPOLAMINE 1 MG/3DAYS TD PT72
1.0000 | MEDICATED_PATCH | TRANSDERMAL | Status: DC
  Administered 2022-03-01 – 2022-03-04 (×2): 1.5 mg via TRANSDERMAL
  Filled 2022-03-01 (×2): qty 1

## 2022-03-01 MED ORDER — DOCUSATE SODIUM 100 MG PO CAPS: 100.0000 mg | ORAL_CAPSULE | Freq: Every day | ORAL | Status: DC

## 2022-03-01 MED ORDER — DIPHENHYDRAMINE HCL 50 MG/ML IJ SOLN
25.0000 mg | Freq: Once | INTRAMUSCULAR | Status: AC
  Administered 2022-03-01: 25 mg via INTRAVENOUS
  Filled 2022-03-01: qty 1

## 2022-03-01 MED ORDER — ONDANSETRON HCL 4 MG/2ML IJ SOLN
4.0000 mg | Freq: Four times a day (QID) | INTRAMUSCULAR | Status: DC
  Administered 2022-03-01 – 2022-03-06 (×19): 4 mg via INTRAVENOUS
  Filled 2022-03-01 (×19): qty 2

## 2022-03-01 MED ORDER — LACTATED RINGERS IV BOLUS
1000.0000 mL | Freq: Once | INTRAVENOUS | Status: AC
  Administered 2022-03-01: 1000 mL via INTRAVENOUS

## 2022-03-01 MED ORDER — METOCLOPRAMIDE HCL 10 MG PO TABS
10.0000 mg | ORAL_TABLET | Freq: Four times a day (QID) | ORAL | Status: DC
  Filled 2022-03-01 (×2): qty 1

## 2022-03-01 MED ORDER — METOCLOPRAMIDE HCL 5 MG/ML IJ SOLN
10.0000 mg | Freq: Four times a day (QID) | INTRAMUSCULAR | Status: DC
  Administered 2022-03-01 – 2022-03-06 (×19): 10 mg via INTRAVENOUS
  Filled 2022-03-01 (×19): qty 2

## 2022-03-01 MED ORDER — PRENATAL MULTIVITAMIN CH: 1.0000 | ORAL_TABLET | Freq: Every day | ORAL | Status: DC

## 2022-03-01 MED ORDER — SODIUM CHLORIDE 0.9 % IV SOLN: 25.0000 mg | Freq: Four times a day (QID) | INTRAVENOUS | Status: DC | PRN

## 2022-03-01 MED ORDER — PROMETHAZINE HCL 25 MG PO TABS: 12.5000 mg | ORAL_TABLET | ORAL | Status: DC | PRN

## 2022-03-01 MED ORDER — POTASSIUM CHLORIDE 10 MEQ/100ML IV SOLN
10.0000 meq | INTRAVENOUS | Status: AC
  Administered 2022-03-01 (×4): 10 meq via INTRAVENOUS
  Filled 2022-03-01 (×4): qty 100

## 2022-03-01 MED ORDER — CALCIUM CARBONATE ANTACID 500 MG PO CHEW: 2.0000 | CHEWABLE_TABLET | ORAL | Status: DC | PRN

## 2022-03-01 MED ORDER — THIAMINE HCL 100 MG/ML IJ SOLN
Freq: Once | INTRAVENOUS | Status: AC
  Filled 2022-03-01: qty 1000

## 2022-03-01 MED ORDER — METOCLOPRAMIDE HCL 5 MG/ML IJ SOLN
10.0000 mg | Freq: Once | INTRAMUSCULAR | Status: AC
  Administered 2022-03-01: 10 mg via INTRAVENOUS
  Filled 2022-03-01: qty 2

## 2022-03-01 MED ORDER — ACETAMINOPHEN 325 MG PO TABS
650.0000 mg | ORAL_TABLET | ORAL | Status: DC | PRN
  Filled 2022-03-01: qty 2

## 2022-03-01 MED ORDER — FAMOTIDINE 20 MG PO TABS
20.0000 mg | ORAL_TABLET | Freq: Two times a day (BID) | ORAL | Status: DC
  Filled 2022-03-01: qty 1

## 2022-03-01 MED ORDER — POTASSIUM CHLORIDE 10 MEQ/100ML IV SOLN
10.0000 meq | INTRAVENOUS | Status: AC
  Administered 2022-03-01 (×2): 10 meq via INTRAVENOUS
  Filled 2022-03-01 (×2): qty 100

## 2022-03-01 MED ORDER — KETOROLAC TROMETHAMINE 30 MG/ML IJ SOLN
30.0000 mg | Freq: Once | INTRAMUSCULAR | Status: AC
  Administered 2022-03-01: 30 mg via INTRAVENOUS
  Filled 2022-03-01: qty 1

## 2022-03-01 MED ORDER — LACTATED RINGERS IV SOLN
INTRAVENOUS | Status: DC
  Administered 2022-03-01: 125 mL/h via INTRAVENOUS

## 2022-03-01 MED ORDER — PROMETHAZINE HCL 25 MG RE SUPP
12.5000 mg | RECTAL | Status: DC | PRN
  Administered 2022-03-01 – 2022-03-03 (×2): 25 mg via RECTAL
  Filled 2022-03-01 (×2): qty 2
  Filled 2022-03-01 (×3): qty 1
  Filled 2022-03-01: qty 2

## 2022-03-01 MED ORDER — ONDANSETRON HCL 4 MG/2ML IJ SOLN
4.0000 mg | Freq: Once | INTRAMUSCULAR | Status: AC
  Administered 2022-03-01: 4 mg via INTRAVENOUS
  Filled 2022-03-01 (×2): qty 2

## 2022-03-01 MED ORDER — HALOPERIDOL LACTATE 5 MG/ML IJ SOLN
2.0000 mg | Freq: Once | INTRAMUSCULAR | Status: AC
  Administered 2022-03-01: 2 mg via INTRAVENOUS
  Filled 2022-03-01: qty 1

## 2022-03-01 MED ORDER — FAMOTIDINE IN NACL 20-0.9 MG/50ML-% IV SOLN
20.0000 mg | Freq: Two times a day (BID) | INTRAVENOUS | Status: DC
  Administered 2022-03-01 – 2022-03-05 (×10): 20 mg via INTRAVENOUS
  Filled 2022-03-01 (×10): qty 50

## 2022-03-01 MED ORDER — PROCHLORPERAZINE EDISYLATE 10 MG/2ML IJ SOLN
10.0000 mg | Freq: Once | INTRAMUSCULAR | Status: AC
  Administered 2022-03-01: 10 mg via INTRAVENOUS
  Filled 2022-03-01: qty 2

## 2022-03-01 MED ORDER — ONDANSETRON HCL 4 MG/2ML IJ SOLN
4.0000 mg | Freq: Once | INTRAMUSCULAR | Status: AC
  Administered 2022-03-01: 4 mg via INTRAVENOUS
  Filled 2022-03-01: qty 2

## 2022-03-01 NOTE — ED Notes (Addendum)
Attempted to get EKG, due pt's monitoring cables were removed by pt. Pt refused EKG at this time, and states that she wants to hold off until she feels better. Notified PA

## 2022-03-01 NOTE — H&P (Signed)
History     CSN: [id number]  Arrival date and time: 03/01/22 0028   Event Date/Time   First Provider Initiated Contact with Patient 03/01/22 [phone]      Chief Complaint  Patient presents with   Emesis   HPI Ms. Krista Stout is a 32 y.o. year old G37P0 female at 10w4dweeks gestation [facility]D x 5 days. She has a h/o cyclic vomiting. She has tried Zofran, but still not able to keep anything down. She reports a temp of over 100 yesterday. She has a h/o HG requiring a feeding tube with her last pregnancy. The patient stated to RN that her partner doesn't know she is pregnant and she doesn't want him to know. Patient had partner leave hospital.  *IV dislodged while being assisted back to bed from BR shortly after arrival to MAU.  OB History     Gravida  2   Para      Term      Preterm      AB      Living         SAB      IAB      Ectopic      Multiple      Live Births              Past Medical History:  Diagnosis Date   Asthma    Bacterial vaginitis    Cyclical vomiting syndrome     Past Surgical History:  Procedure Laterality Date   HERNIA REPAIR      History reviewed. No pertinent family history.  Social History   Tobacco Use   Smoking status: Former   Smokeless tobacco: Never  VScientific laboratory technicianUse: Never used  Substance Use Topics   Alcohol use: No   Drug use: No    Types: Marijuana    Comment: former    Allergies: No Known Allergies  Medications Prior to Admission  Medication Sig Dispense Refill Last Dose   dicyclomine (BENTYL) 20 MG tablet Take 1 tablet (20 mg total) by mouth 2 (two) times daily. (Patient not taking: Reported on 03/01/2022) 20 tablet 0 Completed Course   HYDROcodone-acetaminophen (NORCO/VICODIN) 5-325 MG tablet Take 1-2 tablets by mouth every 6 (six) hours as needed. (Patient not taking: Reported on 03/01/2022) 10 tablet 0 Completed Course   ondansetron  (ZOFRAN ODT) 4 MG disintegrating tablet Take 1 tablet (4 mg total) by mouth every 8 (eight) hours as needed for nausea or vomiting. (Patient not taking: Reported on 03/01/2022) 8 tablet 0 Completed Course   promethazine (PHENERGAN) 25 MG tablet Take 1 tablet (25 mg total) by mouth every 8 (eight) hours as needed for up to 15 doses for nausea or vomiting. (Patient not taking: Reported on 03/01/2022) 15 tablet 0 Completed Course   traMADol (ULTRAM) 50 MG tablet Take 1 tablet (50 mg total) by mouth every 6 (six) hours as needed. (Patient not taking: Reported on 03/01/2022) 15 tablet 0 Completed Course    Review of Systems  Constitutional: Negative.  Negative for fatigue and fever.  HENT: Negative.    Respiratory: Negative.  Negative for shortness of breath.   Cardiovascular: Negative.  Negative for chest pain.  Gastrointestinal:  Positive for abdominal pain, nausea and vomiting. Negative for constipation and diarrhea.  Genitourinary: Negative.  Negative for dysuria, vaginal bleeding and vaginal discharge.  Neurological: Negative.  Negative for dizziness and headaches.  Physical Exam   Blood pressure (!) 127/57, pulse 75, temperature 97.9 F (36.6 C), resp. rate 17, height 5' 3"$  (1.6 m), weight 68 kg, last menstrual period 01/28/2022, SpO2 100 %.  Physical Exam Vitals and nursing note reviewed. Exam conducted with a chaperone present.  Constitutional:      Appearance: Normal appearance. She is normal weight.  Cardiovascular:     Rate and Rhythm: Normal rate.  Pulmonary:     Effort: Pulmonary effort is normal.  Abdominal:     General: Abdomen is flat.     Palpations: Abdomen is soft.  Genitourinary:    Comments: deferred Musculoskeletal:        General: Normal range of motion.  Skin:    General: Skin is warm and dry.  Neurological:     Mental Status: She is alert and oriented to person, place, and time.  Psychiatric:        Mood and Affect: Mood normal.        Behavior: Behavior  normal.        Thought Content: Thought content normal.        Judgment: Judgment normal.    MAU Course  Procedures  MDM TSH Restart IV Reglan 10 mg IVP OB <14 wks U/S with transvaginal   Results for orders placed or performed during the hospital encounter of 03/01/22 (from the past 24 hour(s))  I-Stat Beta hCG blood, ED ([facility], [facility], [facility] only)     Status: Abnormal   Collection Time: 03/01/22 12:50 AM  Result Value Ref Range   I-stat hCG, quantitative >2,000.0 (H) <5 mIU/mL   Comment 3          CBC with Differential     Status: None   Collection Time: 03/01/22  1:15 AM  Result Value Ref Range   WBC 9.3 4.0 - 10.5 K/uL   RBC 4.70 3.87 - 5.11 MIL/uL   Hemoglobin 14.8 12.0 - 15.0 g/dL   HCT 44.6 36.0 - 46.0 %   MCV 94.9 80.0 - 100.0 fL   MCH 31.5 26.0 - 34.0 pg   MCHC 33.2 30.0 - 36.0 g/dL   RDW 13.2 11.5 - 15.5 %   Platelets 324 150 - 400 K/uL   nRBC 0.0 0.0 - 0.2 %   Neutrophils Relative % 54 %   Neutro Abs 5.0 1.7 - 7.7 K/uL   Lymphocytes Relative 34 %   Lymphs Abs 3.2 0.7 - 4.0 K/uL   Monocytes Relative 10 %   Monocytes Absolute 1.0 0.1 - 1.0 K/uL   Eosinophils Relative 2 %   Eosinophils Absolute 0.2 0.0 - 0.5 K/uL   Basophils Relative 0 %   Basophils Absolute 0.0 0.0 - 0.1 K/uL   Immature Granulocytes 0 %   Abs Immature Granulocytes 0.02 0.00 - 0.07 K/uL  Comprehensive metabolic panel     Status: Abnormal   Collection Time: 03/01/22  1:15 AM  Result Value Ref Range   Sodium 135 135 - 145 mmol/L   Potassium 2.8 (L) 3.5 - 5.1 mmol/L   Chloride 100 98 - 111 mmol/L   CO2 24 22 - 32 mmol/L   Glucose, Bld 117 (H) 70 - 99 mg/dL   BUN 6 6 - 20 mg/dL   Creatinine, Ser 0.89 0.44 - 1.00 mg/dL   Calcium 9.2 8.9 - 10.3 mg/dL   Total Protein 7.7 6.5 - 8.1 g/dL   Albumin 4.3 3.5 - 5.0 g/dL   AST 26 15 - 41 U/L  ALT 28 0 - 44 U/L   Alkaline Phosphatase 56 38 - 126 U/L   Total Bilirubin 0.5 0.3 - 1.2 mg/dL   GFR, Estimated >60 >60 mL/min   Anion gap 11 5 - 15  Lipase,  blood     Status: None   Collection Time: 03/01/22  1:15 AM  Result Value Ref Range   Lipase 39 11 - 51 U/L  hCG, quantitative, pregnancy     Status: Abnormal   Collection Time: 03/01/22  1:16 AM  Result Value Ref Range   hCG, Beta Chain, Quant, S 28,410 (H) <5 mIU/mL  ABO/Rh     Status: None   Collection Time: 03/01/22  2:09 AM  Result Value Ref Range   ABO/RH(D)      O POS Performed at [facility] [address] [place], [place] [address]   TSH     Status: Abnormal   Collection Time: 03/01/22  8:13 AM  Result Value Ref Range   TSH 0.240 (L) 0.350 - 4.500 uIU/mL    Report given to and care assumed by Len Blalock, CNM @ [address], CNM 03/01/2022, 6:05 AM   Normal IUP on ultrasound 0900: Patient still vomiting and reporting pain. Haldol and Phenergan given 1015: Patient still actively vomiting despite multiple doses of all antiemetics. Dr. Mikel Cella notified of patient arrival and persistent vomiting. Will admit to [facility] for further management.   Assessment and Plan   1. Nausea and vomiting, unspecified vomiting type   2. Less than [redacted] weeks gestation of pregnancy   3. Hypokalemia    -Admit to [facility] turned over to MD  Wende Mott, CNM 03/01/22 10:29 AM

## 2022-03-01 NOTE — MAU Note (Addendum)
Pt arrived by [facility] to [address] [address] from [facility] Pt ambulated to the BR and had gotten up from the toilet. She got very dizzy and was helped back to bed by one of the [facility] RNs. Pt was telling her partner that she hit her head but [facility] stated she did not hit her head. Pt was supported back to bed by several people. Pt came from [facility] after being treated for n/v/d . In the process of returning to the bed pt's IV came out with intact catheter

## 2022-03-01 NOTE — MAU Note (Signed)
RN called IV team to inquire about timeframe, due to patient request. IV team RN is aware and will get to patient as quickly as they can. Patient updated.

## 2022-03-01 NOTE — ED Notes (Signed)
Will attempt another IV shortly.

## 2022-03-01 NOTE — ED Triage Notes (Signed)
Pt from home BIB [facility] for vomiting, diarrhea and abd pain for approx 5 days. Hx of cyclical vomiting, has script for Zofran, unable to keep down, not able to keep food or water down as well. Reports temp 101 earlier today. EMS reports VSS, pt A&O x4 on arrival. Afebrile at current.

## 2022-03-01 NOTE — ED Notes (Signed)
[facility] arrival.

## 2022-03-01 NOTE — MAU Note (Signed)
IV team at bedside 

## 2022-03-01 NOTE — ED Provider Notes (Signed)
[facility] Provider Note   CSN: [id number] Arrival date & time: 03/01/22  0028     History  Chief Complaint  Patient presents with   Emesis    Krista Stout is a 32 y.o. female [phone] with history of cyclical vomiting syndrome with marijuana use in the past, no marijuana use in the last 2 months.  She presents with 5 days of intractable nausea and vomiting with NBNB emesis.  Generalized abdominal pain from her retching per patient, nonfocal.  Has Zofran and Reglan at home as well as Benadryl but unable to tolerate p.o. x 48 hours.  No medications at home at this time.  Also endorses fever earlier in the day with Tmax 101 F.  I reviewed her medical records.  She has history of asthma, sickle vomiting syndrome.  Patient also has history of 4 prior pregnancies with 1 abortion and 3 living children.  States she has history of hyperemesis gravidarum that required hospitalization and feeding tube placement.  LMP 01/18/2022.  Denies urinary symptoms but endorses some loose stool today with no melena or hematochezia.  Patient presents with her fianc does have bedside, requesting that he be asked to leave the room prior to discussing test results.  HPI     Home Medications Prior to Admission medications   Medication Sig Start Date End Date Taking? Authorizing Provider  dicyclomine (BENTYL) 20 MG tablet Take 1 tablet (20 mg total) by mouth 2 (two) times daily. Patient not taking: Reported on 03/01/2022 06/19/18   Margarita Mail, PA-C  HYDROcodone-acetaminophen (NORCO/VICODIN) 5-325 MG tablet Take 1-2 tablets by mouth every 6 (six) hours as needed. Patient not taking: Reported on 03/01/2022 06/20/18   Montine Circle, PA-C  ondansetron (ZOFRAN ODT) 4 MG disintegrating tablet Take 1 tablet (4 mg total) by mouth every 8 (eight) hours as needed for nausea or vomiting. Patient not taking: Reported on 03/01/2022 06/18/18   Recardo Evangelist, PA-C   promethazine (PHENERGAN) 25 MG tablet Take 1 tablet (25 mg total) by mouth every 8 (eight) hours as needed for up to 15 doses for nausea or vomiting. Patient not taking: Reported on 03/01/2022 01/21/19   Lennice Sites, DO  traMADol (ULTRAM) 50 MG tablet Take 1 tablet (50 mg total) by mouth every 6 (six) hours as needed. Patient not taking: Reported on 03/01/2022 06/19/18   Margarita Mail, PA-C      Allergies    Patient has no known allergies.    Review of Systems   Review of Systems  Constitutional:  Positive for appetite change and fever. Negative for activity change, chills, diaphoresis and fatigue.  HENT: Negative.    Respiratory: Negative.    Cardiovascular: Negative.   Gastrointestinal:  Positive for abdominal pain, nausea and vomiting. Negative for diarrhea.  Genitourinary: Negative.  Negative for vaginal bleeding, vaginal discharge and vaginal pain.  Musculoskeletal: Negative.   Neurological: Negative.     Physical Exam Updated Vital Signs BP (!) 130/107   Pulse 86   Temp 98.4 F (36.9 C) (Oral)   Resp 20   Ht 5' 3"$  (1.6 m)   Wt 68 kg   LMP 01/28/2022 (Exact Date)   SpO2 100%   BMI 26.57 kg/m  Physical Exam Vitals (Uncomfortable appearing, dry heaving at time of my arrival to the bedside.) and nursing note reviewed.  Constitutional:      Appearance: She is not toxic-appearing.  HENT:     Head: Normocephalic and atraumatic.  Mouth/Throat:     Mouth: Mucous membranes are moist.     Pharynx: No oropharyngeal exudate or posterior oropharyngeal erythema.  Eyes:     General:        Right eye: No discharge.        Left eye: No discharge.     Conjunctiva/sclera: Conjunctivae normal.  Cardiovascular:     Rate and Rhythm: Normal rate and regular rhythm.     Pulses: Normal pulses.     Heart sounds: Normal heart sounds. No murmur heard. Pulmonary:     Effort: Pulmonary effort is normal. No respiratory distress.     Breath sounds: Normal breath sounds. No wheezing or  rales.  Abdominal:     General: Bowel sounds are normal. There is no distension.     Palpations: Abdomen is soft.     Tenderness: There is generalized abdominal tenderness.  Musculoskeletal:        General: No deformity.     Cervical back: Neck supple.     Right lower leg: No edema.     Left lower leg: No edema.  Skin:    General: Skin is warm and dry.     Capillary Refill: Capillary refill takes less than 2 seconds.  Neurological:     General: No focal deficit present.     Mental Status: She is alert and oriented to person, place, and time. Mental status is at baseline.  Psychiatric:        Mood and Affect: Mood normal.     ED Results / Procedures / Treatments   Labs (all labs ordered are listed, but only abnormal results are displayed) Labs Reviewed  COMPREHENSIVE METABOLIC PANEL - Abnormal; Notable for the following components:      Result Value   Potassium 2.8 (*)    Glucose, Bld 117 (*)    All other components within normal limits  HCG, QUANTITATIVE, PREGNANCY - Abnormal; Notable for the following components:   hCG, Beta Chain, Quant, S 28,410 (*)    All other components within normal limits  I-STAT BETA HCG BLOOD, ED ([facility], [facility], [facility] ONLY) - Abnormal; Notable for the following components:   I-stat hCG, quantitative >2,000.0 (*)    All other components within normal limits  CBC WITH DIFFERENTIAL/PLATELET  LIPASE, BLOOD  URINALYSIS, ROUTINE W REFLEX MICROSCOPIC  RAPID URINE DRUG SCREEN, HOSP PERFORMED  ABO/RH    EKG EKG Interpretation  Date/Time:  Friday March 01 2022 00:45:23 EST Ventricular Rate:  71 PR Interval:  148 QRS Duration: 100 QT Interval:  392 QTC Calculation: 426 R Axis:   0 Text Interpretation: Sinus arrhythmia Confirmed by Randal Buba, April ([id number]) on 03/01/2022 3:52:58 AM  Radiology No results found.  Procedures Procedures    Medications Ordered in ED Medications  metoCLOPramide (REGLAN) injection 10 mg (10 mg Intravenous Given 03/01/22  0158)  diphenhydrAMINE (BENADRYL) injection 25 mg (25 mg Intravenous Given 03/01/22 0200)  lactated ringers bolus 1,000 mL (0 mLs Intravenous Stopped 03/01/22 0316)  potassium chloride 10 mEq in 100 mL IVPB (0 mEq Intravenous Stopped 03/01/22 0418)  ondansetron (ZOFRAN) injection 4 mg (4 mg Intravenous Given 03/01/22 0326)  prochlorperazine (COMPAZINE) injection 10 mg (10 mg Intravenous Given 03/01/22 0412)    ED Course/ Medical Decision Making/ A&P Clinical Course as of 03/01/22 0452  Fri Mar 01, 2022  0213 Patient required [place] IV, placed by charge RN. Per RN report to this provider, patient repeatedly laying down in the floor of the hospital, unclear why. Has  been directed to remain in the bed at this time.  [RS]  0216 Potassium(!): 2.8 [RS]  [id number] Consult to OBGYN Dr. Harolyn Rutherford who recommends transfer to MAU for further stabilization and OB US. I appreciate her collaboration in the care of this patient.  [RS]    Clinical Course User Index [RS] Benn Tarver, Gypsy Balsam, PA-C                             Medical Decision Making 32 year old female who presents with nausea vomiting and abdominal pain.  VS normal on intake cardiopulmonary exam is normal, abdominal exam is with generalized tenderness palpation.  Differential diagnosis includes limited to pregnancy, cyclic vomiting syndrome, foodborne illness, gastroenteritis, GERD, drug effect.  Amount and/or Complexity of Data Reviewed Labs: ordered. Decision-making details documented in ED Course.    Details: CBC without leukocytosis or anemia, CMP with hypokalemia of 2.8, otherwise unremarkable.  Pregnancy test positive with hCG greater than 2000.  Lipase is unremarkable. HCG quant 28,410.  Risk Prescription drug management. Decision regarding hospitalization.   HCG unexpectedly high for reported LMP ~ 5 weeks ago. Unfortunately, patient too uncomfortable/ agitated to obtain OB US to assess for IUP.  Patient continues to dry heave and roll  around in her bed despite multiple medications for nausea in the ED. Limited based on pregnancy status. Consult to OBGYN as above, patient to be transferred to [facility] service in the MAU.  [place] voiced understanding of her medical evaluation and treatment plan. Each of their questions answered to their expressed satisfaction.  She is amenable for plan to transfer at this time.   This chart was dictated using voice recognition software, Dragon. Despite the best efforts of this provider to proofread and correct errors, errors may still occur which can change documentation meaning.  Final Clinical Impression(s) / ED Diagnoses Final diagnoses:  None    Rx / DC Orders ED Discharge Orders     None         Aura Dials 03/01/22 [place], April, MD 03/01/22 [phone]

## 2022-03-01 NOTE — ED Notes (Signed)
Pt advises being unable to provide urine sample at this time, will monitor. 

## 2022-03-01 NOTE — MAU Provider Note (Cosign Needed Addendum)
History     CSN: [id number]  Arrival date and time: 03/01/22 0028   Event Date/Time   First Provider Initiated Contact with Patient 03/01/22 [phone]      Chief Complaint  Patient presents with   Emesis   HPI Ms. Krista Stout is a 32 y.o. year old G48P0 female at 68w4dweeks gestation [facility] N/V/D x 5 days. She has a h/o cyclic vomiting. She has tried Zofran, but still not able to keep anything down. She reports a temp of over 100 yesterday. She has a h/o HG requiring a feeding tube with her last pregnancy. The patient stated to RN that her partner doesn't know she is pregnant and she doesn't want him to know. Patient had partner leave hospital.  *IV dislodged while being assisted back to bed from BR shortly after arrival to MAU.  OB History     Gravida  2   Para      Term      Preterm      AB      Living         SAB      IAB      Ectopic      Multiple      Live Births              Past Medical History:  Diagnosis Date   Asthma    Bacterial vaginitis    Cyclical vomiting syndrome     Past Surgical History:  Procedure Laterality Date   HERNIA REPAIR      History reviewed. No pertinent family history.  Social History   Tobacco Use   Smoking status: Former   Smokeless tobacco: Never  VScientific laboratory technicianUse: Never used  Substance Use Topics   Alcohol use: No   Drug use: No    Types: Marijuana    Comment: former    Allergies: No Known Allergies  Medications Prior to Admission  Medication Sig Dispense Refill Last Dose   dicyclomine (BENTYL) 20 MG tablet Take 1 tablet (20 mg total) by mouth 2 (two) times daily. (Patient not taking: Reported on 03/01/2022) 20 tablet 0 Completed Course   HYDROcodone-acetaminophen (NORCO/VICODIN) 5-325 MG tablet Take 1-2 tablets by mouth every 6 (six) hours as needed. (Patient not taking: Reported on 03/01/2022) 10 tablet 0 Completed Course   ondansetron  (ZOFRAN ODT) 4 MG disintegrating tablet Take 1 tablet (4 mg total) by mouth every 8 (eight) hours as needed for nausea or vomiting. (Patient not taking: Reported on 03/01/2022) 8 tablet 0 Completed Course   promethazine (PHENERGAN) 25 MG tablet Take 1 tablet (25 mg total) by mouth every 8 (eight) hours as needed for up to 15 doses for nausea or vomiting. (Patient not taking: Reported on 03/01/2022) 15 tablet 0 Completed Course   traMADol (ULTRAM) 50 MG tablet Take 1 tablet (50 mg total) by mouth every 6 (six) hours as needed. (Patient not taking: Reported on 03/01/2022) 15 tablet 0 Completed Course    Review of Systems Physical Exam   Blood pressure (!) 127/57, pulse 75, temperature 97.9 F (36.6 C), resp. rate 17, height 5' 3"$  (1.6 m), weight 68 kg, last menstrual period 01/28/2022, SpO2 100 %.  Physical Exam Vitals and nursing note reviewed. Exam conducted with a chaperone present.  Constitutional:      Appearance: Normal appearance. She is normal weight.  Cardiovascular:     Rate and  Rhythm: Normal rate.  Pulmonary:     Effort: Pulmonary effort is normal.  Abdominal:     General: Abdomen is flat.     Palpations: Abdomen is soft.  Genitourinary:    Comments: deferred Musculoskeletal:        General: Normal range of motion.  Skin:    General: Skin is warm and dry.  Neurological:     Mental Status: She is alert and oriented to person, place, and time.  Psychiatric:        Mood and Affect: Mood normal.        Behavior: Behavior normal.        Thought Content: Thought content normal.        Judgment: Judgment normal.    MAU Course  Procedures  MDM TSH Restart IV Reglan 10 mg IVP OB <14 wks U/S with transvaginal   Results for orders placed or performed during the hospital encounter of 03/01/22 (from the past 24 hour(s))  I-Stat Beta hCG blood, ED ([facility], [facility], [facility] only)     Status: Abnormal   Collection Time: 03/01/22 12:50 AM  Result Value Ref Range   I-stat hCG, quantitative  >2,000.0 (H) <5 mIU/mL   Comment 3          CBC with Differential     Status: None   Collection Time: 03/01/22  1:15 AM  Result Value Ref Range   WBC 9.3 4.0 - 10.5 K/uL   RBC 4.70 3.87 - 5.11 MIL/uL   Hemoglobin 14.8 12.0 - 15.0 g/dL   HCT 44.6 36.0 - 46.0 %   MCV 94.9 80.0 - 100.0 fL   MCH 31.5 26.0 - 34.0 pg   MCHC 33.2 30.0 - 36.0 g/dL   RDW 13.2 11.5 - 15.5 %   Platelets 324 150 - 400 K/uL   nRBC 0.0 0.0 - 0.2 %   Neutrophils Relative % 54 %   Neutro Abs 5.0 1.7 - 7.7 K/uL   Lymphocytes Relative 34 %   Lymphs Abs 3.2 0.7 - 4.0 K/uL   Monocytes Relative 10 %   Monocytes Absolute 1.0 0.1 - 1.0 K/uL   Eosinophils Relative 2 %   Eosinophils Absolute 0.2 0.0 - 0.5 K/uL   Basophils Relative 0 %   Basophils Absolute 0.0 0.0 - 0.1 K/uL   Immature Granulocytes 0 %   Abs Immature Granulocytes 0.02 0.00 - 0.07 K/uL  Comprehensive metabolic panel     Status: Abnormal   Collection Time: 03/01/22  1:15 AM  Result Value Ref Range   Sodium 135 135 - 145 mmol/L   Potassium 2.8 (L) 3.5 - 5.1 mmol/L   Chloride 100 98 - 111 mmol/L   CO2 24 22 - 32 mmol/L   Glucose, Bld 117 (H) 70 - 99 mg/dL   BUN 6 6 - 20 mg/dL   Creatinine, Ser 0.89 0.44 - 1.00 mg/dL   Calcium 9.2 8.9 - 10.3 mg/dL   Total Protein 7.7 6.5 - 8.1 g/dL   Albumin 4.3 3.5 - 5.0 g/dL   AST 26 15 - 41 U/L   ALT 28 0 - 44 U/L   Alkaline Phosphatase 56 38 - 126 U/L   Total Bilirubin 0.5 0.3 - 1.2 mg/dL   GFR, Estimated >60 >60 mL/min   Anion gap 11 5 - 15  Lipase, blood     Status: None   Collection Time: 03/01/22  1:15 AM  Result Value Ref Range   Lipase 39 11 - 51 U/L  hCG, quantitative, pregnancy     Status: Abnormal   Collection Time: 03/01/22  1:16 AM  Result Value Ref Range   hCG, Beta Chain, Quant, S 28,410 (H) <5 mIU/mL  ABO/Rh     Status: None   Collection Time: 03/01/22  2:09 AM  Result Value Ref Range   ABO/RH(D)      O POS Performed at [facility] [place] [address], [place],  [place] [address]     Report given to and care assumed by Len Blalock, CNM @ [address], CNM 03/01/2022, 6:05 AM   See H&P for compete documentation  Assessment and Plan   1. Nausea and vomiting, unspecified vomiting type   2. Less than [redacted] weeks gestation of pregnancy   3. Hypokalemia    -Admit to [facility] [place] 03/01/22

## 2022-03-01 NOTE — MAU Note (Addendum)
Pt called out saying she needed to go to the BR. Two people were with pt. Pt stood at bedside and legs shaking. She suddenly went limp and helped back into bed. Pt was able to push herself up in bed. Siderails up and pt told not to get up without calling for help. Call light within pt reach. Bedside toilet obtained. Pt stated she did not have to go anymore. RN helped to clean pt up and there was no diarrhea on buttocks. Pt stated she went on her blanket but the stain on blanket was dry

## 2022-03-01 NOTE — Plan of Care (Signed)
  Problem: Education: Goal: Knowledge of disease or condition will improve Outcome: Progressing Goal: Knowledge of the prescribed therapeutic regimen will improve Outcome: Progressing Goal: Individualized Educational Video(s) Outcome: Progressing   Problem: Clinical Measurements: Goal: Complications related to the disease process, condition or treatment will be avoided or minimized Outcome: Progressing   Problem: Education: Goal: Knowledge of disease or condition will improve Outcome: Progressing Goal: Knowledge of the prescribed therapeutic regimen will improve Outcome: Progressing   Problem: Bowel/Gastric: Goal: Occurences of nausea and/or vomiting will decrease Outcome: Progressing   Problem: Fluid Volume: Goal: Maintenance of adequate hydration will improve Outcome: Progressing   Problem: Nutritional: Goal: Achievement of adequate weight for body size and type will improve Outcome: Progressing   Problem: Education: Goal: Knowledge of General Education information will improve Description: Including pain rating scale, medication(s)/side effects and non-pharmacologic comfort measures Outcome: Progressing   Problem: Health Behavior/Discharge Planning: Goal: Ability to manage health-related needs will improve Outcome: Progressing   Problem: Clinical Measurements: Goal: Ability to maintain clinical measurements within normal limits will improve Outcome: Progressing Goal: Will remain free from infection Outcome: Progressing Goal: Diagnostic test results will improve Outcome: Progressing Goal: Respiratory complications will improve Outcome: Progressing Goal: Cardiovascular complication will be avoided Outcome: Progressing   Problem: Activity: Goal: Risk for activity intolerance will decrease Outcome: Progressing   Problem: Nutrition: Goal: Adequate nutrition will be maintained Outcome: Progressing   Problem: Coping: Goal: Level of anxiety will  decrease Outcome: Progressing   Problem: Elimination: Goal: Will not experience complications related to bowel motility Outcome: Progressing Goal: Will not experience complications related to urinary retention Outcome: Progressing   Problem: Pain Managment: Goal: General experience of comfort will improve Outcome: Progressing   Problem: Safety: Goal: Ability to remain free from injury will improve Outcome: Progressing   Problem: Skin Integrity: Goal: Risk for impaired skin integrity will decrease Outcome: Progressing

## 2022-03-01 NOTE — MAU Note (Signed)
Pt asked for IV attempt be made by nurse as Dirk Dress was able to get one IV in without u/s use. One attempt made without success and IV consult order placed after unsuccessful attempt. Pt states she had 3 at [facility] as they kept coming out when she was helped to bed after "passing out"

## 2022-03-02 ENCOUNTER — Inpatient Hospital Stay: Payer: Self-pay

## 2022-03-02 LAB — COMPREHENSIVE METABOLIC PANEL
ALT: 20 U/L (ref 0–44)
AST: 18 U/L (ref 15–41)
Albumin: 3.7 g/dL (ref 3.5–5.0)
Alkaline Phosphatase: 59 U/L (ref 38–126)
Anion gap: 10 (ref 5–15)
BUN: <5 mg/dL — ABNORMAL LOW (ref 6–20)
CO2: 23 mmol/L (ref 22–32)
Calcium: 8.9 mg/dL (ref 8.9–10.3)
Chloride: 99 mmol/L (ref 98–111)
Creatinine, Ser: 0.74 mg/dL (ref 0.44–1.00)
GFR, Estimated: >60 mL/min (ref >60)
Glucose, Bld: 108 mg/dL — ABNORMAL HIGH (ref 70–99)
Potassium: 3.2 mmol/L — ABNORMAL LOW (ref 3.5–5.1)
Sodium: 132 mmol/L — ABNORMAL LOW (ref 135–145)
Total Bilirubin: 0.5 mg/dL (ref 0.3–1.2)
Total Protein: 6.9 g/dL (ref 6.5–8.1)

## 2022-03-02 LAB — MAGNESIUM: Magnesium: 1.6 mg/dL — ABNORMAL LOW (ref 1.7–2.4)

## 2022-03-02 LAB — PHOSPHORUS: Phosphorus: 2.8 mg/dL (ref 2.5–4.6)

## 2022-03-02 MED ORDER — SODIUM CHLORIDE 0.9% FLUSH
10.0000 mL | Freq: Two times a day (BID) | INTRAVENOUS | Status: DC
  Administered 2022-03-03 – 2022-03-05 (×5): 10 mL

## 2022-03-02 MED ORDER — CHLORHEXIDINE GLUCONATE CLOTH 2 % EX PADS
6.0000 | MEDICATED_PAD | Freq: Every day | CUTANEOUS | Status: DC
  Administered 2022-03-02 – 2022-03-05 (×4): 6 via TOPICAL

## 2022-03-02 MED ORDER — SODIUM CHLORIDE 0.9% FLUSH: 10.0000 mL | INTRAVENOUS | Status: DC | PRN

## 2022-03-02 MED ORDER — ACETAMINOPHEN 10 MG/ML IV SOLN
1000.0000 mg | Freq: Four times a day (QID) | INTRAVENOUS | Status: AC | PRN
  Administered 2022-03-02: 1000 mg via INTRAVENOUS
  Filled 2022-03-02: qty 100

## 2022-03-02 MED ORDER — POTASSIUM CHLORIDE 10 MEQ/100ML IV SOLN
10.0000 meq | Freq: Once | INTRAVENOUS | Status: AC
  Administered 2022-03-02: 10 meq via INTRAVENOUS
  Filled 2022-03-02: qty 100

## 2022-03-02 MED ORDER — POTASSIUM CHLORIDE 10 MEQ/100ML IV SOLN
10.0000 meq | INTRAVENOUS | Status: AC
  Administered 2022-03-02 (×3): 10 meq via INTRAVENOUS
  Filled 2022-03-02 (×3): qty 100

## 2022-03-02 MED ORDER — MAGNESIUM SULFATE 2 GM/50ML IV SOLN
2.0000 g | Freq: Once | INTRAVENOUS | Status: AC
  Administered 2022-03-02: 2 g via INTRAVENOUS
  Filled 2022-03-02: qty 50

## 2022-03-02 NOTE — Progress Notes (Signed)
[place] COMPREHENSIVE PROGRESS NOTE  Krista Stout is a 32 y.o. G2P0 at 79w0dwho is admitted for hyperemesis gravidarum. Estimated Date of Delivery: 10/26/22  Length of Stay:  1 Days. Admitted 03/01/2022  Subjective: Feels like her nausea is improving, but she is not tolerating PO. Has muscle aches from retching.   Vitals:  Blood pressure (!) 155/77, pulse (!) 56, temperature 98.4 F (36.9 C), temperature source Oral, resp. rate 16, height 5' 3"$  (1.6 m), weight 65.4 kg, last menstrual period 01/28/2022, SpO2 100 %. Physical Examination: CONSTITUTIONAL: Well-developed, well-nourished female in no acute distress.  NEUROLOGIC: Alert and oriented to person, place, and time.  PSYCHIATRIC: Normal mood and affect. Normal behavior. Normal judgment and thought content. CARDIOVASCULAR: Normal heart rate noted, regular rhythm RESPIRATORY: Effort normal, no problems with respiration noted ABDOMEN: Soft, nontender, nondistended  Results for orders placed or performed during the hospital encounter of 03/01/22 (from the past 48 hour(s))  I-Stat Beta hCG blood, ED ([facility], [facility], [facility] only)     Status: Abnormal   Collection Time: 03/01/22 12:50 AM  Result Value Ref Range   I-stat hCG, quantitative >2,000.0 (H) <5 mIU/mL   Comment 3            Comment:   GEST. AGE      CONC.  (mIU/mL)   <=1 WEEK        5 - 50     2 WEEKS       50 - 500     3 WEEKS       100 - 10,000     4 WEEKS     1,000 - 30,000        FEMALE AND NON-PREGNANT FEMALE:     LESS THAN 5 mIU/mL   CBC with Differential     Status: None   Collection Time: 03/01/22  1:15 AM  Result Value Ref Range   WBC 9.3 4.0 - 10.5 K/uL   RBC 4.70 3.87 - 5.11 MIL/uL   Hemoglobin 14.8 12.0 - 15.0 g/dL   HCT 44.6 36.0 - 46.0 %   MCV 94.9 80.0 - 100.0 fL   MCH 31.5 26.0 - 34.0 pg   MCHC 33.2 30.0 - 36.0 g/dL   RDW 13.2 11.5 - 15.5 %   Platelets 324 150 - 400 K/uL   nRBC 0.0 0.0 - 0.2 %   Neutrophils Relative % 54 %   Neutro Abs 5.0  1.7 - 7.7 K/uL   Lymphocytes Relative 34 %   Lymphs Abs 3.2 0.7 - 4.0 K/uL   Monocytes Relative 10 %   Monocytes Absolute 1.0 0.1 - 1.0 K/uL   Eosinophils Relative 2 %   Eosinophils Absolute 0.2 0.0 - 0.5 K/uL   Basophils Relative 0 %   Basophils Absolute 0.0 0.0 - 0.1 K/uL   Immature Granulocytes 0 %   Abs Immature Granulocytes 0.02 0.00 - 0.07 K/uL    Comment: Performed at [facility] [address] [place] [address] Comprehensive metabolic panel     Status: Abnormal   Collection Time: 03/01/22  1:15 AM  Result Value Ref Range   Sodium 135 135 - 145 mmol/L   Potassium 2.8 (L) 3.5 - 5.1 mmol/L   Chloride 100 98 - 111 mmol/L   CO2 24 22 - 32 mmol/L   Glucose, Bld 117 (H) 70 - 99 mg/dL    Comment: Glucose reference range applies only to samples taken after fasting for at  least 8 hours.   BUN 6 6 - 20 mg/dL   Creatinine, Ser 0.89 0.44 - 1.00 mg/dL   Calcium 9.2 8.9 - 10.3 mg/dL   Total Protein 7.7 6.5 - 8.1 g/dL   Albumin 4.3 3.5 - 5.0 g/dL   AST 26 15 - 41 U/L   ALT 28 0 - 44 U/L   Alkaline Phosphatase 56 38 - 126 U/L   Total Bilirubin 0.5 0.3 - 1.2 mg/dL   GFR, Estimated >60 >60 mL/min    Comment: (NOTE) Calculated using the CKD-EPI Creatinine Equation (2021)    Anion gap 11 5 - 15    Comment: Performed at [facility], [place] [address], [place], [place] [address]  Lipase, blood     Status: None   Collection Time: 03/01/22  1:15 AM  Result Value Ref Range   Lipase 39 11 - 51 U/L    Comment: Performed at [facility], [place] [address], [place], [place] [address]  hCG, quantitative, pregnancy     Status: Abnormal   Collection Time: 03/01/22  1:16 AM  Result Value Ref Range   hCG, Beta Chain, Quant, S 28,410 (H) <5 mIU/mL    Comment:          GEST. AGE      CONC.  (mIU/mL)   <=1 WEEK        5 - 50     2 WEEKS       50 - 500     3 WEEKS       100 - 10,000     4 WEEKS     1,000 - 30,000     5 WEEKS      3,500 - 115,000   6-8 WEEKS     12,000 - 270,000    12 WEEKS     15,000 - 220,000        FEMALE AND NON-PREGNANT FEMALE:     LESS THAN 5 mIU/mL Performed at [facility], [place] [address], [place], [place] [address]   ABO/Rh     Status: None   Collection Time: 03/01/22  2:09 AM  Result Value Ref Range   ABO/RH(D)      O POS Performed at [facility], [place] [address], [place], [place] [address]   TSH     Status: Abnormal   Collection Time: 03/01/22  8:13 AM  Result Value Ref Range   TSH 0.240 (L) 0.350 - 4.500 uIU/mL    Comment: Performed by a 3rd Generation assay with a functional sensitivity of <=0.01 uIU/mL. Performed at [facility], [place] [address] [place], [place] [address]   Type and screen [place]     Status: None   Collection Time: 03/01/22 11:32 AM  Result Value Ref Range   ABO/RH(D) O POS    Antibody Screen NEG    Sample Expiration      03/04/2022,2359 Performed at [facility], [place] [address], [place], [place] [address]   T4, free     Status: None   Collection Time: 03/01/22 11:36 AM  Result Value Ref Range   Free T4 1.03 0.61 - 1.12 ng/dL    Comment: (NOTE) Biotin ingestion may interfere with free T4 tests. If the results are inconsistent with the TSH level, previous test results, or the clinical presentation, then consider biotin interference. If needed, order repeat testing after stopping biotin. Performed at [facility],  [address] [place], [place] [address]   Urinalysis, Routine w reflex microscopic -Urine, Clean Catch     Status: Abnormal   Collection Time: 03/01/22  1:16 PM  Result Value Ref Range   Color, Urine YELLOW YELLOW   APPearance CLEAR CLEAR   Specific Gravity, Urine 1.016 1.005 - 1.030   pH 7.0 5.0 - 8.0   Glucose, UA 150 (A) NEGATIVE mg/dL   Hgb urine dipstick NEGATIVE NEGATIVE   Bilirubin Urine NEGATIVE NEGATIVE   Ketones, ur 80 (A) NEGATIVE mg/dL    Protein, ur NEGATIVE NEGATIVE mg/dL   Nitrite NEGATIVE NEGATIVE   Leukocytes,Ua NEGATIVE NEGATIVE    Comment: Performed at [place] [address] [place], [place] [address]  Urine rapid drug screen (hosp performed)     Status: Abnormal   Collection Time: 03/01/22  1:16 PM  Result Value Ref Range   Opiates NONE DETECTED NONE DETECTED   Cocaine NONE DETECTED NONE DETECTED   Benzodiazepines NONE DETECTED NONE DETECTED   Amphetamines NONE DETECTED NONE DETECTED   Tetrahydrocannabinol POSITIVE (A) NONE DETECTED   Barbiturates NONE DETECTED NONE DETECTED    Comment: (NOTE) DRUG SCREEN FOR MEDICAL PURPOSES ONLY.  IF CONFIRMATION IS NEEDED FOR ANY PURPOSE, NOTIFY LAB WITHIN 5 DAYS.  LOWEST DETECTABLE LIMITS FOR URINE DRUG SCREEN Drug Class                     Cutoff (ng/mL) Amphetamine and metabolites    1000 Barbiturate and metabolites    200 Benzodiazepine                 200 Opiates and metabolites        300 Cocaine and metabolites        300 THC                            50 Performed at [facility], [place] [address], [place], [place] [address]   Magnesium     Status: Abnormal   Collection Time: 03/02/22  7:48 AM  Result Value Ref Range   Magnesium 1.6 (L) 1.7 - 2.4 mg/dL    Comment: Performed at [place] [address] [place], [place] [address]  Phosphorus     Status: None   Collection Time: 03/02/22  7:48 AM  Result Value Ref Range   Phosphorus 2.8 2.5 - 4.6 mg/dL    Comment: Performed at [place] [address], [place], [place] [address]  Comprehensive metabolic panel     Status: Abnormal   Collection Time: 03/02/22  7:48 AM  Result Value Ref Range   Sodium 132 (L) 135 - 145 mmol/L   Potassium 3.2 (L) 3.5 - 5.1 mmol/L   Chloride 99 98 - 111 mmol/L   CO2 23 22 - 32 mmol/L   Glucose, Bld 108 (H) 70 - 99 mg/dL    Comment: Glucose reference range applies only to samples taken after fasting for at least 8 hours.   BUN <5 (L) 6 -  20 mg/dL   Creatinine, Ser 0.74 0.44 - 1.00 mg/dL   Calcium 8.9 8.9 - 10.3 mg/dL   Total Protein 6.9 6.5 - 8.1 g/dL   Albumin 3.7 3.5 - 5.0 g/dL   AST 18 15 - 41 U/L   ALT 20 0 - 44 U/L   Alkaline Phosphatase 59 38 - 126 U/L   Total Bilirubin  0.5 0.3 - 1.2 mg/dL   GFR, Estimated >60 >60 mL/min    Comment: (NOTE) Calculated using the CKD-EPI Creatinine Equation (2021)    Anion gap 10 5 - 15    Comment: Performed at [place] [address] [place],  [address]    US OB LESS THAN 14 WEEKS WITH [place] TRANSVAGINAL  Result Date: 03/01/2022 CLINICAL DATA:  [id number] Abdominal pain during pregnancy in first trimester [id number] EXAM: OBSTETRIC <14 WK [place] AND TRANSVAGINAL OB US TECHNIQUE: Both transabdominal and transvaginal ultrasound examinations were performed for complete evaluation of the gestation as well as the maternal uterus, adnexal regions, and pelvic cul-de-sac. Transvaginal technique was performed to assess early pregnancy. COMPARISON:  06/19/2018 FINDINGS: Intrauterine gestational sac: Single Yolk sac:  Visualized. Embryo:  Visualized. Cardiac Activity: Visualized. Heart Rate: Unable to capture on ultrasound due to small size of embryo and patient motion during exam. MSD: 10.8 mm   5 w   6 d CRL:  1.8 mm Subchorionic hemorrhage:  None visualized. Maternal uterus/adnexae: Retroverted uterus. At the periphery of the endometrium is a focal multicystic area measuring 1.4 x 0.8 x 1.2 cm, indeterminate. This does not abut the gestational sac. Bilateral ovaries within normal limits. Small volume free fluid within the pelvis. The previously seen solid intrapelvic mass was not evident on today's study. IMPRESSION: 1. Intrauterine gestational sac containing a yolk sac and small embryo (crown-rump length of 1.8 mm). Pregnancy measures approximally 5 weeks 6 days as calculated by mean sac diameter. The technologist reported visible cardiac activity during exam. A fetal heart rate was unable to  be captured during exam due to small size of embryo and patient motion. Short-term follow-up ultrasound is recommended to assess viability. 2. At the periphery of the endometrium is a focal multicystic area measuring 1.4 x 0.8 x 1.2 cm, indeterminate. This does not abut the gestational sac. Attention on follow-up. 3. The previously seen solid intrapelvic mass was not evident on today's study. 4. Small volume free fluid within the pelvis, which may be physiologic. Electronically Signed   By: Davina Poke D.O.   On: 03/01/2022 09:19    Current scheduled medications  diphenhydrAMINE  25 mg Intravenous Q6H   docusate sodium  100 mg Oral Daily   famotidine  20 mg Oral Q12H   metoCLOPramide  10 mg Oral Q6H   Or   metoCLOPramide (REGLAN) injection  10 mg Intravenous Q6H   ondansetron (ZOFRAN) IV  4 mg Intravenous Q6H   prenatal multivitamin  1 tablet Oral Q1200   scopolamine  1 patch Transdermal Q72H   I have reviewed the patient's current medications.  ASSESSMENT: Principal Problem:   Hyperemesis gravidarum  PLAN: Hyperemesis gravidarum - LR 125cc/h - Scheduled IV zofran, reglan, benadryl, & pepcid. Scopolamine patch. Phenergan PR for breakthrough.  - IV tylenol & heating pads prn for pain - Recommended trial of steroids given persistent symptoms, but pt would prefer to give IV meds another 24 hours - Regular diet ordered, pt can self-regulate  2. Hypokalemia, hypomagnesemia  - K 3.2, Mg 1.6 this AM - IV K x 6 runs & IV mag 2g ordered for today - Daily labs, replete prn   3. Elevated BP - consistent with cHTN - will discuss starting antihypertensive with patient  4. Abnormal thyroid testing - TSH low (0.24), fT4 normal (1.03) - will CTM, repeat as outpatient  5. History of pelvic mass - will clarify with patient when she is feeling better. Partner  in room during our discussion and it is unclear how much patient wants to discuss medical history in front of him  Continue  routine antenatal care.  Gale Journey, MD [place], [facility] for [employer], [place]

## 2022-03-02 NOTE — Progress Notes (Signed)
Peripherally Inserted Central Catheter Placement  The IV Nurse has discussed with the patient and/or persons authorized to consent for the patient, the purpose of this procedure and the potential benefits and risks involved with this procedure.  The benefits include less needle sticks, lab draws from the catheter, and the patient may be discharged home with the catheter. Risks include, but not limited to, infection, bleeding, blood clot (thrombus formation), and puncture of an artery; nerve damage and irregular heartbeat and possibility to perform a PICC exchange if needed/ordered by physician.  Alternatives to this procedure were also discussed.  Bard Power PICC patient education guide, fact sheet on infection prevention and patient information card has been provided to patient /or left at bedside.    PICC Placement Documentation  PICC Double Lumen [id number] Right Basilic 35 cm 0 cm (Active)  Indication for Insertion or Continuance of Line Poor Vasculature-patient has had multiple peripheral attempts or PIVs lasting less than 24 hours 03/02/22 1712  Exposed Catheter (cm) 0 cm 03/02/22 1712  Site Assessment Clean, Dry, Intact 03/02/22 1712  Lumen #1 Status Flushed;Saline locked;Blood return noted 03/02/22 1712  Lumen #2 Status Flushed;Saline locked;Blood return noted 03/02/22 1712  Dressing Type Transparent;Securing device 03/02/22 1712  Dressing Status Antimicrobial disc in place;Clean, Dry, Intact 03/02/22 1712  Safety Lock Not Applicable [id number]  Line Care Connections checked and tightened 03/02/22 1712  Line Adjustment (NICU/IV Team Only) No 03/02/22 1712  Dressing Intervention New dressing 03/02/22 1712  Dressing Change Due 03/09/22 03/02/22 1712       Rolena Infante 03/02/2022, 5:13 PM

## 2022-03-03 ENCOUNTER — Encounter (HOSPITAL_COMMUNITY): Payer: Self-pay | Admitting: Obstetrics and Gynecology

## 2022-03-03 DIAGNOSIS — Z3A01 Less than 8 weeks gestation of pregnancy: Secondary | ICD-10-CM

## 2022-03-03 DIAGNOSIS — O210 Mild hyperemesis gravidarum: Secondary | ICD-10-CM

## 2022-03-03 LAB — COMPREHENSIVE METABOLIC PANEL
ALT: 18 U/L (ref 0–44)
AST: 15 U/L (ref 15–41)
Albumin: 3.6 g/dL (ref 3.5–5.0)
Alkaline Phosphatase: 57 U/L (ref 38–126)
Anion gap: 9 (ref 5–15)
BUN: 5 mg/dL — ABNORMAL LOW (ref 6–20)
CO2: 22 mmol/L (ref 22–32)
Calcium: 8.5 mg/dL — ABNORMAL LOW (ref 8.9–10.3)
Chloride: 101 mmol/L (ref 98–111)
Creatinine, Ser: 0.64 mg/dL (ref 0.44–1.00)
GFR, Estimated: >60 mL/min (ref >60)
Glucose, Bld: 86 mg/dL (ref 70–99)
Potassium: 3 mmol/L — ABNORMAL LOW (ref 3.5–5.1)
Sodium: 132 mmol/L — ABNORMAL LOW (ref 135–145)
Total Bilirubin: 0.5 mg/dL (ref 0.3–1.2)
Total Protein: 6.9 g/dL (ref 6.5–8.1)

## 2022-03-03 LAB — MAGNESIUM: Magnesium: 1.9 mg/dL (ref 1.7–2.4)

## 2022-03-03 LAB — PHOSPHORUS: Phosphorus: 2.6 mg/dL (ref 2.5–4.6)

## 2022-03-03 MED ORDER — KETOROLAC TROMETHAMINE 15 MG/ML IJ SOLN
15.0000 mg | Freq: Once | INTRAMUSCULAR | Status: AC
  Administered 2022-03-03: 15 mg via INTRAVENOUS
  Filled 2022-03-03: qty 1

## 2022-03-03 MED ORDER — METHYLPREDNISOLONE 16 MG PO TABS
16.0000 mg | ORAL_TABLET | Freq: Every day | ORAL | Status: DC
  Filled 2022-03-03 (×2): qty 1

## 2022-03-03 MED ORDER — METHYLPREDNISOLONE 4 MG PO TABS: 4.0000 mg | ORAL_TABLET | Freq: Every day | ORAL | Status: DC

## 2022-03-03 MED ORDER — METHYLPREDNISOLONE 4 MG PO TABS: 8.0000 mg | ORAL_TABLET | Freq: Every day | ORAL | Status: DC

## 2022-03-03 MED ORDER — METHYLPREDNISOLONE SODIUM SUCC 125 MG IJ SOLR
48.0000 mg | Freq: Once | INTRAMUSCULAR | Status: AC
  Administered 2022-03-03: 48 mg via INTRAVENOUS
  Filled 2022-03-03: qty 2

## 2022-03-03 MED ORDER — METHYLPREDNISOLONE 4 MG PO TABS
8.0000 mg | ORAL_TABLET | Freq: Every day | ORAL | Status: DC
  Filled 2022-03-03: qty 2

## 2022-03-03 MED ORDER — POTASSIUM CHLORIDE 10 MEQ/100ML IV SOLN
10.0000 meq | INTRAVENOUS | Status: AC
  Administered 2022-03-03 (×4): 10 meq via INTRAVENOUS
  Filled 2022-03-03 (×4): qty 100

## 2022-03-03 MED ORDER — METHYLPREDNISOLONE 16 MG PO TABS
16.0000 mg | ORAL_TABLET | Freq: Every day | ORAL | Status: DC
  Filled 2022-03-03 (×3): qty 1

## 2022-03-03 NOTE — Progress Notes (Signed)
[place] COMPREHENSIVE PROGRESS NOTE  Krista Stout is a 32 y.o. G2P0 at 68w1dwho is admitted for hyperemesis gravidarum.  Estimated Date of Delivery: 10/26/22  Length of Stay:  2 Days. Admitted 03/01/2022  Subjective: No improvement in her symptoms from yesterday. Unable to tolerate any PO and continued to vomit throughout the night. Has continued body pain that she attributes to vomiting. Nausea does not improve with hot showers.  [place], but her symptoms did not resolve until her pregnancy was terminated. Reports that this feels similar. [place]   Vitals:  Blood pressure (!) 156/86, pulse 60, temperature 98.5 F (36.9 C), temperature source Oral, resp. rate 16, height 5' 3"$  (1.6 m), weight 63.2 kg, last menstrual period 01/28/2022, SpO2 99 %. Physical Examination: CONSTITUTIONAL: Resting in bed, intermittently retching NEUROLOGIC: Alert and oriented to person, place, and time.  PSYCHIATRIC: Normal mood and affect. Normal behavior. Normal judgment and thought content. CARDIOVASCULAR: Normal heart rate noted RESPIRATORY: Effort normal, no problems with respiration noted ABDOMEN: Soft, nontender, nondistended  Results for orders placed or performed during the hospital encounter of 03/01/22 (from the past 48 hour(s))  Magnesium     Status: Abnormal   Collection Time: 03/02/22  7:48 AM  Result Value Ref Range   Magnesium 1.6 (L) 1.7 - 2.4 mg/dL    Comment: [facility]formed [facility] [address] [place] [address] Phosphorus     Status: None   Collection Time: 03/02/22  7:48 AM  Result Value Ref Range   Phosphorus 2.8 2.5 - 4.6 mg/dL    Comment: Performed at [place] [place] [address] Comprehensive metabolic panel     Status: Abnormal   Collection Time: 03/02/22  7:48 AM  Result Value Ref  Range   Sodium 132 (L) 135 - 145 mmol/L   Potassium 3.2 (L) 3.5 - 5.1 mmol/L   Chloride 99 98 - 111 mmol/L   CO2 23 22 - 32 mmol/L   Glucose, Bld 108 (H) 70 - 99 mg/dL    Comment: Glucose reference range applies only to samples taken after fasting for at least 8 hours.   BUN <5 (L) 6 - 20 mg/dL   Creatinine, Ser 0.74 0.44 - 1.00 mg/dL   Calcium 8.9 8.9 - 10.3 mg/dL   Total Protein 6.9 6.5 - 8.1 g/dL   Albumin 3.7 3.5 - 5.0 g/dL   AST 18 15 - 41 U/L   ALT 20 0 - 44 U/L   Alkaline Phosphatase 59 38 - 126 U/L   Total Bilirubin 0.5 0.3 - 1.2 mg/dL   GFR, Estimated >60 >60 mL/min    Comment: (NOTE) Calculated using the CKD-EPI Creatinine Equation (2021)    Anion gap 10 5 - 15    Comment: Performed at [facility], [place] [address] Magnesium     Status: None   Collection Time: 03/03/22  5:40 AM  Result Value Ref Range   Magnesium 1.9 1.7 - 2.4 mg/dL    Comment: Performed at [address], [place] [address] Phosphorus     Status: None   Collection Time: 03/03/22  5:40 AM  Result Value Ref Range   Phosphorus 2.6 2.5 - 4.6 mg/dL    Comment: Performed at [facility], [place] [address] [place], [place] [address]  Comprehensive metabolic panel     Status: Abnormal   Collection Time: 03/03/22  5:40 AM  Result Value Ref Range   Sodium 132 (L) 135 - 145 mmol/L   Potassium 3.0 (L) 3.5 - 5.1 mmol/L   Chloride 101 98 - 111 mmol/L   CO2 22 22 - 32 mmol/L   Glucose, Bld 86 70 - 99 mg/dL    Comment: Glucose reference range applies only to samples taken after fasting for at least 8 hours.   BUN 5 (L) 6 - 20 mg/dL   Creatinine, Ser 0.64 0.44 - 1.00 mg/dL   Calcium 8.5 (L) 8.9 - 10.3 mg/dL   Total Protein 6.9 6.5 - 8.1 g/dL   Albumin 3.6 3.5 - 5.0 g/dL   AST 15 15 - 41 U/L   ALT 18 0 - 44 U/L   Alkaline Phosphatase 57 38 - 126 U/L   Total Bilirubin 0.5 0.3 - 1.2 mg/dL   GFR, Estimated >60 >60 mL/min    Comment:  (NOTE) Calculated using the CKD-EPI Creatinine Equation (2021)    Anion gap 9 5 - 15    Comment: Performed at [facility], [place] [address] [place], [place] [address]   [place] EKG SITE RITE  Result Date: 03/02/2022 If [facility] image not attached, placement could not be confirmed due to current cardiac rhythm.   Current scheduled medications  Chlorhexidine Gluconate Cloth  6 each Topical Daily   diphenhydrAMINE  25 mg Intravenous Q6H   docusate sodium  100 mg Oral Daily   famotidine  20 mg Oral Q12H   [START ON 03/04/2022] methylPREDNISolone  16 mg Oral Q breakfast   Followed by   Derrill Memo ON 03/08/2022] methylPREDNISolone  8 mg Oral Q breakfast   Followed by   Derrill Memo ON 03/15/2022] methylPREDNISolone  4 mg Oral Q breakfast   [START ON 03/04/2022] methylPREDNISolone  16 mg Oral Q1400   Followed by   Derrill Memo ON 03/06/2022] methylPREDNISolone  8 mg Oral Q1400   Followed by   Derrill Memo ON 03/09/2022] methylPREDNISolone  4 mg Oral Q1400   [START ON 03/04/2022] methylPREDNISolone  16 mg Oral QHS   Followed by   Derrill Memo ON 03/07/2022] methylPREDNISolone  8 mg Oral QHS   Followed by   Derrill Memo ON 03/10/2022] methylPREDNISolone  4 mg Oral QHS   metoCLOPramide  10 mg Oral Q6H   Or   metoCLOPramide (REGLAN) injection  10 mg Intravenous Q6H   ondansetron (ZOFRAN) IV  4 mg Intravenous Q6H   prenatal multivitamin  1 tablet Oral Q1200   scopolamine  1 patch Transdermal Q72H   sodium chloride flush  10-40 mL Intracatheter Q12H   I have reviewed the patient's current medications.  ASSESSMENT: Principal Problem:   Hyperemesis gravidarum  PLAN: Hyperemesis gravidarum - continue LR 125cc/h - Scheduled IV zofran, reglan, benadryl, & pepcid. Scopolamine patch. Phenergan PR for breakthrough.  - Methylprednisolone taper ordered to start today  - IV tylenol & heating pads prn for pain - Regular diet ordered, pt can self-regulate   2. Hypokalemia, hypomagnesemia  - K 3.0, Mg 1.9 this AM - IV K x 4 runs  ordered for today - Daily labs, replete prn    3. Elevated BP - consistent with cHTN, pt reports that she typically has low BPs and attributes this to her current illness - unable to tolerate PO medications, will defer antihypertensives - will follow up with PCP after  pregnancy termination   4. Abnormal thyroid testing - TSH low (0.24), fT4 normal (1.03) - will CTM, repeat as outpatient   5. History of pelvic mass - s/p RSO in [place] - benign. Chart updated to reflect this surgery.   Continue routine antenatal care.   Gale Journey, MD [place], [facility] for [employer], [place]

## 2022-03-04 LAB — COMPREHENSIVE METABOLIC PANEL
ALT: 15 U/L (ref 0–44)
AST: 14 U/L — ABNORMAL LOW (ref 15–41)
Albumin: 3.7 g/dL (ref 3.5–5.0)
Alkaline Phosphatase: 65 U/L (ref 38–126)
Anion gap: 12 (ref 5–15)
BUN: <5 mg/dL — ABNORMAL LOW (ref 6–20)
CO2: 22 mmol/L (ref 22–32)
Calcium: 8.9 mg/dL (ref 8.9–10.3)
Chloride: 97 mmol/L — ABNORMAL LOW (ref 98–111)
Creatinine, Ser: 0.69 mg/dL (ref 0.44–1.00)
GFR, Estimated: >60 mL/min (ref >60)
Glucose, Bld: 88 mg/dL (ref 70–99)
Potassium: 2.9 mmol/L — ABNORMAL LOW (ref 3.5–5.1)
Sodium: 131 mmol/L — ABNORMAL LOW (ref 135–145)
Total Bilirubin: 1.1 mg/dL (ref 0.3–1.2)
Total Protein: 7 g/dL (ref 6.5–8.1)

## 2022-03-04 LAB — MAGNESIUM: Magnesium: 1.9 mg/dL (ref 1.7–2.4)

## 2022-03-04 LAB — PHOSPHORUS: Phosphorus: 2.8 mg/dL (ref 2.5–4.6)

## 2022-03-04 LAB — GLUCOSE, CAPILLARY: Glucose-Capillary: 92 mg/dL (ref 70–99)

## 2022-03-04 MED ORDER — PYRIDOXINE HCL 100 MG/ML IJ SOLN
100.0000 mg | Freq: Every day | INTRAMUSCULAR | Status: DC
  Administered 2022-03-04 – 2022-03-05 (×2): 100 mg via INTRAVENOUS
  Filled 2022-03-04 (×4): qty 1

## 2022-03-04 MED ORDER — METHYLPREDNISOLONE SODIUM SUCC 40 MG IJ SOLR
16.0000 mg | Freq: Once | INTRAMUSCULAR | Status: AC
  Administered 2022-03-04: 16 mg via INTRAVENOUS
  Filled 2022-03-04: qty 0.4

## 2022-03-04 MED ORDER — POTASSIUM CHLORIDE 10 MEQ/100ML IV SOLN
10.0000 meq | INTRAVENOUS | Status: AC
  Administered 2022-03-04 (×4): 10 meq via INTRAVENOUS
  Filled 2022-03-04 (×3): qty 100

## 2022-03-04 MED ORDER — SODIUM CHLORIDE 0.9 % IV SOLN
25.0000 mg | INTRAVENOUS | Status: DC
  Administered 2022-03-04 – 2022-03-05 (×5): 25 mg via INTRAVENOUS
  Filled 2022-03-04 (×6): qty 1

## 2022-03-04 MED ORDER — METHYLPREDNISOLONE SODIUM SUCC 125 MG IJ SOLR
16.0000 mg | Freq: Once | INTRAMUSCULAR | Status: AC
  Administered 2022-03-04: 16.25 mg via INTRAVENOUS
  Filled 2022-03-04: qty 2

## 2022-03-04 MED ORDER — POTASSIUM CHLORIDE 10 MEQ/100ML IV SOLN
INTRAVENOUS | Status: AC
  Filled 2022-03-04: qty 100

## 2022-03-04 NOTE — Progress Notes (Signed)
Patient ID: Krista Stout, female   DOB: 14-Jul-1990, 32 y.o.   MRN: [id number] [place] COMPREHENSIVE PROGRESS NOTE  Krista Stout is a 32 y.o. G2P0 at 65w2dwho is admitted for hyperemesis gravidarum.  Estimated Date of Delivery: 10/26/22  Length of Stay:  3 Days. Admitted 03/01/2022  Subjective: No improvement in her symptoms from yesterday. Unable to tolerate any PO and continued to vomit throughout the night. Patient unable to tolerate ice chips. [facility]  Vitals:  Blood pressure (!) 140/85, pulse 61, temperature 98.2 F (36.8 C), temperature source Oral, resp. rate 18, height 5' 3"$  (1.6 m), weight 63.2 kg, last menstrual period 01/28/2022, SpO2 99 %. Physical Examination: CONSTITUTIONAL: Resting in bed, intermittently retching NEUROLOGIC: Alert and oriented to person, place, and time.  PSYCHIATRIC: Normal mood and affect. Normal behavior. Normal judgment and thought content. CARDIOVASCULAR: Normal heart rate noted RESPIRATORY: Effort normal, no problems with respiration noted ABDOMEN: Soft, nontender, nondistended  Results for orders placed or performed during the hospital encounter of 03/01/22 (from the past 48 hour(s))  Magnesium     Status: None   Collection Time: 03/03/22  5:40 AM  Result Value Ref Range   Magnesium 1.9 1.7 - 2.4 mg/dL    Comment: Performed at [facility] [address], [place] [address] Phosphorus     Status: None   Collection Time: 03/03/22  5:40 AM  Result Value Ref Range   Phosphorus 2.6 2.5 - 4.6 mg/dL    Comment: Performed at [address], [place] [address] Comprehensive metabolic panel     Status: Abnormal   Collection Time: 03/03/22  5:40 AM  Result Value Ref Range   Sodium 132 (L) 135 - 145 mmol/L   Potassium 3.0 (L) 3.5 - 5.1 mmol/L   Chloride 101 98 - 111 mmol/L   CO2 22 22 - 32 mmol/L    Glucose, Bld 86 70 - 99 mg/dL    Comment: Glucose reference range applies only to samples taken after fasting for at least 8 hours.   BUN 5 (L) 6 - 20 mg/dL   Creatinine, Ser 0.64 0.44 - 1.00 mg/dL   Calcium 8.5 (L) 8.9 - 10.3 mg/dL   Total Protein 6.9 6.5 - 8.1 g/dL   Albumin 3.6 3.5 - 5.0 g/dL   AST 15 15 - 41 U/L   ALT 18 0 - 44 U/L   Alkaline Phosphatase 57 38 - 126 U/L   Total Bilirubin 0.5 0.3 - 1.2 mg/dL   GFR, Estimated >60 >60 mL/min    Comment: (NOTE) Calculated using the CKD-EPI Creatinine Equation (2021)    Anion gap 9 5 - 15    Comment: Performed at [facility] [address], [place] Glucose, capillary     Status: None   Collection Time: 03/04/22  5:11 AM  Result Value Ref Range   Glucose-Capillary 92 70 - 99 mg/dL    Comment: Glucose reference range applies only to samples taken after fasting for at least 8 hours.  Magnesium     Status: None   Collection Time: 03/04/22  5:16 AM  Result Value Ref Range   Magnesium 1.9 1.7 - 2.4 mg/dL    Comment: Performed at [address] [place] [address] Phosphorus     Status: None  Collection Time: 03/04/22  5:16 AM  Result Value Ref Range   Phosphorus 2.8 2.5 - 4.6 mg/dL    Comment: Performed at [facility], [place] [address], [place], [place] [address]  Comprehensive metabolic panel     Status: Abnormal   Collection Time: 03/04/22  5:16 AM  Result Value Ref Range   Sodium 131 (L) 135 - 145 mmol/L   Potassium 2.9 (L) 3.5 - 5.1 mmol/L   Chloride 97 (L) 98 - 111 mmol/L   CO2 22 22 - 32 mmol/L   Glucose, Bld 88 70 - 99 mg/dL    Comment: Glucose reference range applies only to samples taken after fasting for at least 8 hours.   BUN <5 (L) 6 - 20 mg/dL   Creatinine, Ser 0.69 0.44 - 1.00 mg/dL   Calcium 8.9 8.9 - 10.3 mg/dL   Total Protein 7.0 6.5 - 8.1 g/dL   Albumin 3.7 3.5 - 5.0 g/dL   AST 14 (L) 15 - 41 U/L   ALT 15 0 - 44 U/L   Alkaline Phosphatase 65 38  - 126 U/L   Total Bilirubin 1.1 0.3 - 1.2 mg/dL   GFR, Estimated >60 >60 mL/min    Comment: (NOTE) Calculated using the CKD-EPI Creatinine Equation (2021)    Anion gap 12 5 - 15    Comment: Performed at [facility] [address], [place], [place] [address]   [place] EKG SITE RITE  Result Date: 03/02/2022 If [facility] image not attached, placement could not be confirmed due to current cardiac rhythm.   Current scheduled medications  Chlorhexidine Gluconate Cloth  6 each Topical Daily   diphenhydrAMINE  25 mg Intravenous Q6H   docusate sodium  100 mg Oral Daily   famotidine  20 mg Oral Q12H   methylPREDNISolone  16 mg Oral Q breakfast   Followed by   Derrill Memo ON 03/08/2022] methylPREDNISolone  8 mg Oral Q breakfast   Followed by   Derrill Memo ON 03/15/2022] methylPREDNISolone  4 mg Oral Q breakfast   methylPREDNISolone  16 mg Oral Q1400   Followed by   Derrill Memo ON 03/06/2022] methylPREDNISolone  8 mg Oral Q1400   Followed by   Derrill Memo ON 03/09/2022] methylPREDNISolone  4 mg Oral Q1400   methylPREDNISolone  16 mg Oral QHS   Followed by   Derrill Memo ON 03/07/2022] methylPREDNISolone  8 mg Oral QHS   Followed by   Derrill Memo ON 03/10/2022] methylPREDNISolone  4 mg Oral QHS   metoCLOPramide  10 mg Oral Q6H   Or   metoCLOPramide (REGLAN) injection  10 mg Intravenous Q6H   ondansetron (ZOFRAN) IV  4 mg Intravenous Q6H   prenatal multivitamin  1 tablet Oral Q1200   scopolamine  1 patch Transdermal Q72H   sodium chloride flush  10-40 mL Intracatheter Q12H   I have reviewed the patient's current medications.  ASSESSMENT: Principal Problem:   Hyperemesis gravidarum  PLAN: Hyperemesis gravidarum - continue LR 125cc/h - Will continue scheduled antiemetics - Continue Methylprednisolone taper  - Advance diet as tolerated   2. Hypokalemia  - IV K x 4 runs ordered for today - Daily labs, replete prn    3. Elevated BP - Will defer antihypertensives as patient is not able to tolerate po  medication - will follow up with PCP after pregnancy termination   4. Abnormal thyroid testing - TSH low (0.24), fT4 normal (1.03) - will CTM, repeat as outpatient    Continue routine antenatal care.  Mora Bellman, MD [place], [facility] for [employer], [place]

## 2022-03-05 LAB — COMPREHENSIVE METABOLIC PANEL
ALT: 16 U/L (ref 0–44)
AST: 14 U/L — ABNORMAL LOW (ref 15–41)
Albumin: 3.5 g/dL (ref 3.5–5.0)
Alkaline Phosphatase: 63 U/L (ref 38–126)
Anion gap: 14 (ref 5–15)
BUN: 6 mg/dL (ref 6–20)
CO2: 20 mmol/L — ABNORMAL LOW (ref 22–32)
Calcium: 8.4 mg/dL — ABNORMAL LOW (ref 8.9–10.3)
Chloride: 98 mmol/L (ref 98–111)
Creatinine, Ser: 0.71 mg/dL (ref 0.44–1.00)
GFR, Estimated: >60 mL/min (ref >60)
Glucose, Bld: 85 mg/dL (ref 70–99)
Potassium: 3.1 mmol/L — ABNORMAL LOW (ref 3.5–5.1)
Sodium: 132 mmol/L — ABNORMAL LOW (ref 135–145)
Total Bilirubin: 0.9 mg/dL (ref 0.3–1.2)
Total Protein: 6.5 g/dL (ref 6.5–8.1)

## 2022-03-05 LAB — GLUCOSE, CAPILLARY: Glucose-Capillary: 90 mg/dL (ref 70–99)

## 2022-03-05 LAB — PHOSPHORUS: Phosphorus: 3.6 mg/dL (ref 2.5–4.6)

## 2022-03-05 LAB — MAGNESIUM: Magnesium: 1.9 mg/dL (ref 1.7–2.4)

## 2022-03-05 MED ORDER — METHYLPREDNISOLONE SODIUM SUCC 40 MG IJ SOLR
16.0000 mg | Freq: Once | INTRAMUSCULAR | Status: AC
  Administered 2022-03-05: 16 mg via INTRAVENOUS
  Filled 2022-03-05: qty 0.4

## 2022-03-05 MED ORDER — ONDANSETRON 4 MG PO TBDP: 4.0000 mg | ORAL_TABLET | Freq: Four times a day (QID) | ORAL | 0 refills | Status: DC | PRN

## 2022-03-05 MED ORDER — METHYLPREDNISOLONE SODIUM SUCC 125 MG IJ SOLR
16.0000 mg | Freq: Once | INTRAMUSCULAR | Status: AC
  Administered 2022-03-05: 16.25 mg via INTRAVENOUS
  Filled 2022-03-05: qty 2

## 2022-03-05 MED ORDER — SODIUM CHLORIDE 0.9% FLUSH
10.0000 mL | Freq: Two times a day (BID) | INTRAVENOUS | Status: DC
  Administered 2022-03-05 (×2): 10 mL

## 2022-03-05 MED ORDER — POTASSIUM CHLORIDE 10 MEQ/100ML IV SOLN
10.0000 meq | INTRAVENOUS | Status: AC
  Administered 2022-03-05 (×4): 10 meq via INTRAVENOUS
  Filled 2022-03-05 (×4): qty 100

## 2022-03-05 NOTE — Discharge Summary (Signed)
Physician Discharge Summary  Patient ID: Krista Stout MRN: [id number] DOB/AGE: [birth year] 32 y.o.  Admit date: 03/01/2022 Discharge date: 03/06/2022  Admission Diagnoses:Hyperemesis gravidarum  Discharge Diagnoses:  Principal Problem:   Hyperemesis gravidarum   Discharged Condition: poor but stable  Hospital Course: Patient admitted with significant hyperemesis gravidarum. Patient unable to tolerate liquids or ice chips. Over the course of her admission, patient received different regimens of anti-emetic and was started on a prednisone taper without much improvement in her symptoms. Her electrolytes abnormalities were slowly corrected. On HD #5, patient elected to leave for a scheduled appointment in preparation for termination of pregnancy in [place]. She was prescribed zofran OD and phenergan PR. Patient plans to return to the hospital, following her appointment for continued in patient management of her hyperemesis.  Consults: None  Significant Diagnostic Studies: labs: CMET, Magnesium and phosphorous and radiology: Ultrasound: live IUP  Treatments: IV hydration, steroids: prednisone, and antiemetic  Discharge Exam: Blood pressure 121/74, pulse 74, temperature 98 F (36.7 C), temperature source Oral, resp. rate 18, height 5' 3"$  (1.6 m), weight 62.6 kg, last menstrual period 01/28/2022, SpO2 100 %. General appearance: alert, cooperative, and no distress Head: Normocephalic, without obvious abnormality, atraumatic Resp: Normal respiratory effort Cardio: Normal rate GI: soft, non-tender; bowel sounds normal; no masses,  no organomegaly Extremities: extremities normal, atraumatic, no cyanosis or edema Skin: Skin color, texture, turgor normal. No rashes or lesions Neurologic: Grossly normal  Disposition: Discharge disposition: 01-Home or Self Care       Discharge Instructions     Activity as tolerated - No restrictions   Complete by: As directed    Call MD for:    Complete by: As directed    Decreased fetal movement, contractions, and vaginal bleeding.   Call MD for:  difficulty breathing, headache or visual disturbances   Complete by: As directed    Call MD for:  persistant nausea and vomiting   Complete by: As directed    Call MD for:  redness, tenderness, or signs of infection (pain, swelling, redness, odor or green/yellow discharge around incision site)   Complete by: As directed    Call MD for:  severe uncontrolled pain   Complete by: As directed    Call MD for:  temperature >100.4   Complete by: As directed    Diet general   Complete by: As directed    May shower / Bathe   Complete by: As directed       Allergies as of 03/06/2022   No Known Allergies      Medication List     TAKE these medications    metoCLOPramide 10 MG tablet Commonly known as: REGLAN Take 1 tablet (10 mg total) by mouth every 6 (six) hours.   ondansetron 4 MG disintegrating tablet Commonly known as: ZOFRAN-ODT Take 1 tablet (4 mg total) by mouth every 6 (six) hours as needed for nausea.   promethazine 25 MG suppository Commonly known as: PHENERGAN Place 1 suppository (25 mg total) rectally every 6 (six) hours as needed for nausea or vomiting.   scopolamine 1 MG/3DAYS Commonly known as: TRANSDERM-SCOP Place 1 patch (1.5 mg total) onto the skin every 3 (three) days. Start taking on: March 07, 2022        [facility] Follow up.   Why: Once appointment is completed. Contact information: [place] [id number] [phone]  Signed: Radene Gunning 03/06/2022, 1:51 AM

## 2022-03-05 NOTE — Progress Notes (Signed)
Patient ID: Krista Stout, female   DOB: [birth year], 32 y.o.   MRN: [id number] [place] COMPREHENSIVE PROGRESS NOTE  Krista Stout is a 32 y.o. G2P0 at 69w2dwho is admitted for hyperemesis gravidarum.  Estimated Date of Delivery: 10/26/22  Length of Stay:  4 Days. Admitted 03/01/2022  Subjective: No improvement in her symptoms from yesterday. Unable to tolerate any PO and continued to vomit throughout the night. Patient attempted to consume a popsicle but reports immediate emesis. Patient is scheduled for initial consultation with AB provider [place] ([place])  Vitals:  Blood pressure 119/75, pulse 62, temperature 98.1 F (36.7 C), temperature source Oral, resp. rate 17, height 5' 3"$  (1.6 m), weight 62.6 kg, last menstrual period 01/28/2022, SpO2 99 %. Physical Examination: CONSTITUTIONAL: Resting in bed, intermittently retching NEUROLOGIC: Alert and oriented to person, place, and time.  PSYCHIATRIC: Normal mood and affect. Normal behavior. Normal judgment and thought content. CARDIOVASCULAR: Normal heart rate noted RESPIRATORY: Effort normal, no problems with respiration noted ABDOMEN: Soft, nontender, nondistended  Results for orders placed or performed during the hospital encounter of 03/01/22 (from the past 48 hour(s))  Glucose, capillary     Status: None   Collection Time: 03/04/22  5:11 AM  Result Value Ref Range   Glucose-Capillary 92 70 - 99 mg/dL    Comment: Glucose reference range applies only to samples taken after fasting for at least 8 hours.  Magnesium     Status: None   Collection Time: 03/04/22  5:16 AM  Result Value Ref Range   Magnesium 1.9 1.7 - 2.4 mg/dL    Comment: Performed at [address] [place] [address] Phosphorus     Status: None   Collection Time: 03/04/22  5:16 AM  Result Value Ref Range   Phosphorus 2.8 2.5 - 4.6 mg/dL    Comment: Performed at [address], [place] [address] Comprehensive metabolic panel     Status: Abnormal   Collection Time: 03/04/22  5:16 AM  Result Value Ref Range   Sodium 131 (L) 135 - 145 mmol/L   Potassium 2.9 (L) 3.5 - 5.1 mmol/L   Chloride 97 (L) 98 - 111 mmol/L   CO2 22 22 - 32 mmol/L   Glucose, Bld 88 70 - 99 mg/dL    Comment: Glucose reference range applies only to samples taken after fasting for at least 8 hours.   BUN <5 (L) 6 - 20 mg/dL   Creatinine, Ser 0.69 0.44 - 1.00 mg/dL   Calcium 8.9 8.9 - 10.3 mg/dL   Total Protein 7.0 6.5 - 8.1 g/dL   Albumin 3.7 3.5 - 5.0 g/dL   AST 14 (L) 15 - 41 U/L   ALT 15 0 - 44 U/L   Alkaline Phosphatase 65 38 - 126 U/L   Total Bilirubin 1.1 0.3 - 1.2 mg/dL   GFR, Estimated >60 >60 mL/min    Comment: (NOTE) Calculated using the CKD-EPI Creatinine Equation (2021)    Anion gap 12 5 - 15    Comment: Performed at [address] [place] [address] Magnesium     Status: None   Collection Time: 03/05/22  6:16 AM  Result Value Ref Range   Magnesium 1.9 1.7 - 2.4 mg/dL    Comment: Performed at [address] [place] [address] Phosphorus  Status: None   Collection Time: 03/05/22  6:16 AM  Result Value Ref Range   Phosphorus 3.6 2.5 - 4.6 mg/dL    Comment: Performed at [facility] [address], [place], [place] [address]  Comprehensive metabolic panel     Status: Abnormal   Collection Time: 03/05/22  6:16 AM  Result Value Ref Range   Sodium 132 (L) 135 - 145 mmol/L   Potassium 3.1 (L) 3.5 - 5.1 mmol/L   Chloride 98 98 - 111 mmol/L   CO2 20 (L) 22 - 32 mmol/L   Glucose, Bld 85 70 - 99 mg/dL    Comment: Glucose reference range applies only to samples taken after fasting for at least 8 hours.   BUN 6 6 - 20 mg/dL   Creatinine, Ser 0.71 0.44 - 1.00 mg/dL   Calcium 8.4 (L) 8.9 - 10.3 mg/dL   Total Protein 6.5 6.5 - 8.1 g/dL   Albumin 3.5 3.5 - 5.0 g/dL   AST 14 (L)  15 - 41 U/L   ALT 16 0 - 44 U/L   Alkaline Phosphatase 63 38 - 126 U/L   Total Bilirubin 0.9 0.3 - 1.2 mg/dL   GFR, Estimated >60 >60 mL/min    Comment: (NOTE) Calculated using the CKD-EPI Creatinine Equation (2021)    Anion gap 14 5 - 15    Comment: Performed at [facility], [place] [address], [place], [place] [address]  Glucose, capillary     Status: None   Collection Time: 03/05/22  6:34 AM  Result Value Ref Range   Glucose-Capillary 90 70 - 99 mg/dL    Comment: Glucose reference range applies only to samples taken after fasting for at least 8 hours.   No results found.  Current scheduled medications  Chlorhexidine Gluconate Cloth  6 each Topical Daily   diphenhydrAMINE  25 mg Intravenous Q6H   docusate sodium  100 mg Oral Daily   famotidine  20 mg Oral Q12H   methylPREDNISolone  16 mg Oral Q breakfast   Followed by   Derrill Memo ON 03/08/2022] methylPREDNISolone  8 mg Oral Q breakfast   Followed by   Derrill Memo ON 03/15/2022] methylPREDNISolone  4 mg Oral Q breakfast   methylPREDNISolone  16 mg Oral Q1400   Followed by   Derrill Memo ON 03/06/2022] methylPREDNISolone  8 mg Oral Q1400   Followed by   Derrill Memo ON 03/09/2022] methylPREDNISolone  4 mg Oral Q1400   methylPREDNISolone  16 mg Oral QHS   Followed by   Derrill Memo ON 03/07/2022] methylPREDNISolone  8 mg Oral QHS   Followed by   Derrill Memo ON 03/10/2022] methylPREDNISolone  4 mg Oral QHS   metoCLOPramide  10 mg Oral Q6H   Or   metoCLOPramide (REGLAN) injection  10 mg Intravenous Q6H   ondansetron (ZOFRAN) IV  4 mg Intravenous Q6H   prenatal multivitamin  1 tablet Oral Q1200   pyridOXINE  100 mg Intravenous Daily   scopolamine  1 patch Transdermal Q72H   sodium chloride flush  10-40 mL Intracatheter Q12H   sodium chloride flush  10-40 mL Intracatheter Q12H   I have reviewed the patient's current medications.  ASSESSMENT: Principal Problem:   Hyperemesis gravidarum  PLAN: Hyperemesis gravidarum - continue LR 125cc/h - Will  continue scheduled antiemetics - Continue Methylprednisolone taper  - Advance diet as tolerated   2. Hypokalemia slowly improving - IV K x 4 runs ordered for today - Daily labs, replete prn    3.  Elevated BP - BP normal today - will follow up with PCP after pregnancy termination   4. Abnormal thyroid testing - TSH low (0.24), fT4 normal (1.03) - will CTM, repeat as outpatient    Continue routine antenatal care.   Mora Bellman, MD [place], [facility] for [employer], [place]

## 2022-03-06 ENCOUNTER — Inpatient Hospital Stay (HOSPITAL_COMMUNITY)
Admission: AD | Admit: 2022-03-06 | Discharge: 2022-03-12 | Disposition: A | Discharge status: Home / Self Care | Payer: Medicaid - Out of State | Source: Home / Self Care | Attending: Obstetrics & Gynecology | Admitting: Obstetrics & Gynecology

## 2022-03-06 ENCOUNTER — Other Ambulatory Visit: Payer: Self-pay

## 2022-03-06 ENCOUNTER — Inpatient Hospital Stay: Payer: Self-pay

## 2022-03-06 DIAGNOSIS — R1900 Intra-abdominal and pelvic swelling, mass and lump, unspecified site: Secondary | ICD-10-CM | POA: Diagnosis present

## 2022-03-06 DIAGNOSIS — Z3A01 Less than 8 weeks gestation of pregnancy: Secondary | ICD-10-CM

## 2022-03-06 DIAGNOSIS — R1110 Vomiting, unspecified: Secondary | ICD-10-CM | POA: Insufficient documentation

## 2022-03-06 DIAGNOSIS — Z87891 Personal history of nicotine dependence: Secondary | ICD-10-CM

## 2022-03-06 DIAGNOSIS — J45909 Unspecified asthma, uncomplicated: Secondary | ICD-10-CM | POA: Diagnosis present

## 2022-03-06 DIAGNOSIS — Z789 Other specified health status: Secondary | ICD-10-CM | POA: Diagnosis not present

## 2022-03-06 DIAGNOSIS — O99511 Diseases of the respiratory system complicating pregnancy, first trimester: Secondary | ICD-10-CM | POA: Diagnosis present

## 2022-03-06 DIAGNOSIS — O210 Mild hyperemesis gravidarum: Secondary | ICD-10-CM

## 2022-03-06 LAB — GLUCOSE, CAPILLARY: Glucose-Capillary: 100 mg/dL — ABNORMAL HIGH (ref 70–99)

## 2022-03-06 MED ORDER — ENOXAPARIN SODIUM 40 MG/0.4ML IJ SOSY
40.0000 mg | PREFILLED_SYRINGE | INTRAMUSCULAR | Status: DC
  Filled 2022-03-06 (×5): qty 0.4

## 2022-03-06 MED ORDER — METOCLOPRAMIDE HCL 5 MG/ML IJ SOLN
10.0000 mg | Freq: Four times a day (QID) | INTRAMUSCULAR | Status: DC | PRN
  Administered 2022-03-07 – 2022-03-12 (×9): 10 mg via INTRAVENOUS
  Filled 2022-03-06 (×9): qty 2

## 2022-03-06 MED ORDER — SCOPOLAMINE 1 MG/3DAYS TD PT72: 1.0000 | MEDICATED_PATCH | TRANSDERMAL | 12 refills | Status: DC

## 2022-03-06 MED ORDER — ONDANSETRON 4 MG PO TBDP: 4.0000 mg | ORAL_TABLET | Freq: Four times a day (QID) | ORAL | 0 refills | Status: DC | PRN

## 2022-03-06 MED ORDER — ONDANSETRON HCL 4 MG/2ML IJ SOLN
4.0000 mg | INTRAMUSCULAR | Status: DC
  Administered 2022-03-07 – 2022-03-09 (×15): 4 mg via INTRAVENOUS
  Filled 2022-03-06 (×16): qty 2

## 2022-03-06 MED ORDER — METHYLPREDNISOLONE 16 MG PO TABS
16.0000 mg | ORAL_TABLET | Freq: Every day | ORAL | Status: DC
  Filled 2022-03-06 (×2): qty 1

## 2022-03-06 MED ORDER — METHYLPREDNISOLONE 4 MG PO TABS: 4.0000 mg | ORAL_TABLET | Freq: Every day | ORAL | Status: DC

## 2022-03-06 MED ORDER — SODIUM CHLORIDE 0.9 % IV SOLN
25.0000 mg | Freq: Four times a day (QID) | INTRAVENOUS | Status: DC | PRN
  Administered 2022-03-12: 25 mg via INTRAVENOUS
  Filled 2022-03-06: qty 1

## 2022-03-06 MED ORDER — POTASSIUM CHLORIDE IN NACL 20-0.9 MEQ/L-% IV SOLN
INTRAVENOUS | Status: DC
  Filled 2022-03-06 (×6): qty 1000

## 2022-03-06 MED ORDER — METHYLPREDNISOLONE 4 MG PO TABS: 8.0000 mg | ORAL_TABLET | Freq: Every day | ORAL | Status: DC

## 2022-03-06 MED ORDER — SCOPOLAMINE 1 MG/3DAYS TD PT72
1.0000 | MEDICATED_PATCH | TRANSDERMAL | Status: DC
  Administered 2022-03-07 – 2022-03-10 (×2): 1.5 mg via TRANSDERMAL
  Filled 2022-03-06 (×2): qty 1

## 2022-03-06 MED ORDER — FAMOTIDINE IN NACL 20-0.9 MG/50ML-% IV SOLN
20.0000 mg | Freq: Two times a day (BID) | INTRAVENOUS | Status: DC
  Administered 2022-03-07 – 2022-03-09 (×6): 20 mg via INTRAVENOUS
  Filled 2022-03-06 (×6): qty 50

## 2022-03-06 MED ORDER — PROMETHAZINE HCL 25 MG PO TABS
25.0000 mg | ORAL_TABLET | Freq: Four times a day (QID) | ORAL | Status: DC | PRN
  Filled 2022-03-06: qty 1

## 2022-03-06 MED ORDER — PROMETHAZINE HCL 25 MG RE SUPP
25.0000 mg | Freq: Four times a day (QID) | RECTAL | Status: DC | PRN
  Administered 2022-03-07 – 2022-03-11 (×14): 25 mg via RECTAL
  Filled 2022-03-06 (×21): qty 1

## 2022-03-06 MED ORDER — METHYLPREDNISOLONE 16 MG PO TABS
16.0000 mg | ORAL_TABLET | Freq: Every day | ORAL | Status: DC
  Filled 2022-03-06: qty 1

## 2022-03-06 MED ORDER — ONDANSETRON 4 MG PO TBDP
4.0000 mg | ORAL_TABLET | ORAL | Status: DC
  Filled 2022-03-06 (×2): qty 1

## 2022-03-06 MED ORDER — PROMETHAZINE HCL 25 MG RE SUPP: 25.0000 mg | Freq: Four times a day (QID) | RECTAL | 0 refills | Status: DC | PRN

## 2022-03-06 MED ORDER — METOCLOPRAMIDE HCL 10 MG PO TABS: 10.0000 mg | ORAL_TABLET | Freq: Four times a day (QID) | ORAL | 0 refills | Status: DC

## 2022-03-06 MED ORDER — METHYLPREDNISOLONE SODIUM SUCC 125 MG IJ SOLR
48.0000 mg | Freq: Once | INTRAMUSCULAR | Status: AC
  Administered 2022-03-07: 48 mg via INTRAVENOUS
  Filled 2022-03-06: qty 2

## 2022-03-06 NOTE — Progress Notes (Signed)
Pt's PICC line discontinued. AVS documentation discussed w/ pt and all questions answered. Vitals WNL. All belongings returned to pt.

## 2022-03-07 ENCOUNTER — Encounter (HOSPITAL_COMMUNITY): Payer: Self-pay | Admitting: Obstetrics and Gynecology

## 2022-03-07 DIAGNOSIS — Z3A01 Less than 8 weeks gestation of pregnancy: Secondary | ICD-10-CM

## 2022-03-07 DIAGNOSIS — O210 Mild hyperemesis gravidarum: Secondary | ICD-10-CM

## 2022-03-07 LAB — GLUCOSE, CAPILLARY: Glucose-Capillary: 101 mg/dL — ABNORMAL HIGH (ref 70–99)

## 2022-03-07 LAB — TYPE AND SCREEN
ABO/RH(D): O POS
Antibody Screen: NEGATIVE

## 2022-03-07 LAB — COMPREHENSIVE METABOLIC PANEL
ALT: 18 U/L (ref 0–44)
AST: 20 U/L (ref 15–41)
Albumin: 3.4 g/dL — ABNORMAL LOW (ref 3.5–5.0)
Alkaline Phosphatase: 64 U/L (ref 38–126)
Anion gap: 11 (ref 5–15)
BUN: 10 mg/dL (ref 6–20)
CO2: 23 mmol/L (ref 22–32)
Calcium: 8.5 mg/dL — ABNORMAL LOW (ref 8.9–10.3)
Chloride: 100 mmol/L (ref 98–111)
Creatinine, Ser: 0.66 mg/dL (ref 0.44–1.00)
GFR, Estimated: >60 mL/min (ref >60)
Glucose, Bld: 99 mg/dL (ref 70–99)
Potassium: 2.9 mmol/L — ABNORMAL LOW (ref 3.5–5.1)
Sodium: 134 mmol/L — ABNORMAL LOW (ref 135–145)
Total Bilirubin: 1.1 mg/dL (ref 0.3–1.2)
Total Protein: 6.6 g/dL (ref 6.5–8.1)

## 2022-03-07 LAB — CBC
HCT: 42.9 % (ref 36.0–46.0)
Hemoglobin: 14.5 g/dL (ref 12.0–15.0)
MCH: 32.2 pg (ref 26.0–34.0)
MCHC: 33.8 g/dL (ref 30.0–36.0)
MCV: 95.1 fL (ref 80.0–100.0)
Platelets: 307 10*3/uL (ref 150–400)
RBC: 4.51 MIL/uL (ref 3.87–5.11)
RDW: 13 % (ref 11.5–15.5)
WBC: 10.6 10*3/uL — ABNORMAL HIGH (ref 4.0–10.5)
nRBC: 0 % (ref 0.0–0.2)

## 2022-03-07 LAB — PHOSPHORUS: Phosphorus: 3.6 mg/dL (ref 2.5–4.6)

## 2022-03-07 LAB — MAGNESIUM: Magnesium: 1.7 mg/dL (ref 1.7–2.4)

## 2022-03-07 MED ORDER — SODIUM CHLORIDE 0.9% FLUSH
10.0000 mL | INTRAVENOUS | Status: DC | PRN
  Administered 2022-03-11: 10 mL

## 2022-03-07 MED ORDER — DIPHENHYDRAMINE HCL 50 MG/ML IJ SOLN
25.0000 mg | Freq: Four times a day (QID) | INTRAMUSCULAR | Status: DC | PRN
  Administered 2022-03-07 – 2022-03-12 (×20): 25 mg via INTRAVENOUS
  Filled 2022-03-07 (×20): qty 1

## 2022-03-07 MED ORDER — METHYLPREDNISOLONE SODIUM SUCC 125 MG IJ SOLR
16.0000 mg | Freq: Once | INTRAMUSCULAR | Status: AC
  Administered 2022-03-07: 16.25 mg via INTRAVENOUS
  Filled 2022-03-07: qty 2

## 2022-03-07 MED ORDER — ACETAMINOPHEN 10 MG/ML IV SOLN
1000.0000 mg | Freq: Four times a day (QID) | INTRAVENOUS | Status: AC | PRN
  Administered 2022-03-07: 1000 mg via INTRAVENOUS
  Filled 2022-03-07: qty 100

## 2022-03-07 MED ORDER — METHYLPREDNISOLONE SODIUM SUCC 40 MG IJ SOLR
16.0000 mg | Freq: Once | INTRAMUSCULAR | Status: AC
  Administered 2022-03-07: 16 mg via INTRAVENOUS
  Filled 2022-03-07: qty 0.4

## 2022-03-07 MED ORDER — SODIUM CHLORIDE 0.9% FLUSH
10.0000 mL | Freq: Two times a day (BID) | INTRAVENOUS | Status: DC
  Administered 2022-03-07 – 2022-03-10 (×8): 10 mL
  Administered 2022-03-11: 20 mL
  Administered 2022-03-11: 10 mL

## 2022-03-07 MED ORDER — CHLORHEXIDINE GLUCONATE CLOTH 2 % EX PADS
6.0000 | MEDICATED_PAD | Freq: Every day | CUTANEOUS | Status: DC
  Administered 2022-03-07 – 2022-03-11 (×5): 6 via TOPICAL

## 2022-03-07 MED ORDER — POTASSIUM CHLORIDE 10 MEQ/100ML IV SOLN
10.0000 meq | INTRAVENOUS | Status: AC
  Administered 2022-03-07 (×4): 10 meq via INTRAVENOUS
  Filled 2022-03-07 (×4): qty 100

## 2022-03-07 MED ORDER — DIPHENHYDRAMINE HCL 50 MG/ML IJ SOLN
25.0000 mg | Freq: Every evening | INTRAMUSCULAR | Status: DC | PRN
  Administered 2022-03-07: 25 mg via INTRAVENOUS
  Filled 2022-03-07: qty 1

## 2022-03-07 MED ORDER — KETOROLAC TROMETHAMINE 30 MG/ML IJ SOLN
30.0000 mg | Freq: Four times a day (QID) | INTRAMUSCULAR | Status: AC | PRN
  Administered 2022-03-07 – 2022-03-08 (×3): 30 mg via INTRAVENOUS
  Filled 2022-03-07 (×3): qty 1

## 2022-03-07 MED ORDER — POTASSIUM CHLORIDE 10 MEQ/100ML IV SOLN
10.0000 meq | INTRAVENOUS | Status: AC
  Administered 2022-03-07 (×2): 10 meq via INTRAVENOUS
  Filled 2022-03-07 (×2): qty 100

## 2022-03-07 NOTE — Progress Notes (Signed)
[place] COMPREHENSIVE PROGRESS NOTE  Krista Stout is a 32 y.o. G2P0 at 78w5dwho is admitted for HEG.  Estimated Date of Delivery: 10/26/22   Length of Stay:  1 Days. Admitted 03/06/2022  Subjective: Continued emesis overnight. Some lower abdominal cramping due to her amount of emesis yesterday she thinks. Cannot keep any PO down.   Vitals:  Blood pressure 129/70, pulse 61, temperature 98.2 F (36.8 C), temperature source Oral, resp. rate 16, height 5' 3"$  (1.6 m), weight 68 kg, last menstrual period 01/28/2022, SpO2 99 %. Physical Examination: CONSTITUTIONAL: Well-developed, well-nourished female in no acute distress.  NEUROLOGIC: Alert and oriented to person, place, and time. No cranial nerve deficit noted. PSYCHIATRIC: Normal mood and affect. Normal behavior. Normal judgment and thought content. CARDIOVASCULAR: Normal heart rate noted RESPIRATORY: Effort and breath sounds normal, no problems with respiration noted MUSCULOSKELETAL: Normal range of motion. No edema and no tenderness. 2+ distal pulses. ABDOMEN: Soft, nontender, nondistended   Results for orders placed or performed during the hospital encounter of 03/06/22 (from the past 48 hour(s))  CBC     Status: Abnormal   Collection Time: 03/06/22 11:50 PM  Result Value Ref Range   WBC 10.6 (H) 4.0 - 10.5 K/uL   RBC 4.51 3.87 - 5.11 MIL/uL   Hemoglobin 14.5 12.0 - 15.0 g/dL   HCT 42.9 36.0 - 46.0 %   MCV 95.1 80.0 - 100.0 fL   MCH 32.2 26.0 - 34.0 pg   MCHC 33.8 30.0 - 36.0 g/dL   RDW 13.0 11.5 - 15.5 %   Platelets 307 150 - 400 K/uL   nRBC 0.0 0.0 - 0.2 %    Comment: Performed at [facility] [address] [place] [address] Type and screen [place]    Status: None   Collection Time: 03/07/22  1:51 AM  Result Value Ref Range   ABO/RH(D) O POS    Antibody Screen NEG    Sample Expiration      03/10/2022,2359 Performed at [facility] [address] [place] [address]  Comprehensive metabolic panel     Status: Abnormal   Collection Time: 03/07/22  1:57 AM  Result Value Ref Range   Sodium 134 (L) 135 - 145 mmol/L   Potassium 2.9 (L) 3.5 - 5.1 mmol/L   Chloride 100 98 - 111 mmol/L   CO2 23 22 - 32 mmol/L   Glucose, Bld 99 70 - 99 mg/dL    Comment: Glucose reference range applies only to samples taken after fasting for at least 8 hours.   BUN 10 6 - 20 mg/dL   Creatinine, Ser 0.66 0.44 - 1.00 mg/dL   Calcium 8.5 (L) 8.9 - 10.3 mg/dL   Total Protein 6.6 6.5 - 8.1 g/dL   Albumin 3.4 (L) 3.5 - 5.0 g/dL   AST 20 15 - 41 U/L   ALT 18 0 - 44 U/L   Alkaline Phosphatase 64 38 - 126 U/L   Total Bilirubin 1.1 0.3 - 1.2 mg/dL   GFR, Estimated >60 >60 mL/min    Comment: (NOTE) Calculated using the CKD-EPI Creatinine Equation (2021)    Anion gap 11 5 - 15    Comment: Performed at [facility] [address] [place]  [id number] Magnesium     Status: None   Collection Time: 03/07/22  1:57 AM  Result Value Ref Range   Magnesium 1.7  1.7 - 2.4 mg/dL    Comment: Performed at [facility], [place] [address] [place], [place] [address]  Phosphorus     Status: None   Collection Time: 03/07/22  1:57 AM  Result Value Ref Range   Phosphorus 3.6 2.5 - 4.6 mg/dL    Comment: Performed at [facility] [address], [place], [place] [address]  Glucose, capillary     Status: Abnormal   Collection Time: 03/07/22  4:13 AM  Result Value Ref Range   Glucose-Capillary 101 (H) 70 - 99 mg/dL    Comment: Glucose reference range applies only to samples taken after fasting for at least 8 hours.   Comment 1 Notify RN     [place] EKG SITE RITE  Result Date: 03/06/2022 If [facility] image not attached, placement could not be confirmed due to current cardiac rhythm.   Current scheduled medications  enoxaparin (LOVENOX) injection  40 mg Subcutaneous Q24H   methylPREDNISolone  16 mg Oral Q breakfast   Followed by   Derrill Memo ON  03/11/2022] methylPREDNISolone  8 mg Oral Q breakfast   Followed by   Derrill Memo ON 03/18/2022] methylPREDNISolone  4 mg Oral Q breakfast   methylPREDNISolone  16 mg Oral Q1400   Followed by   Derrill Memo ON 03/09/2022] methylPREDNISolone  8 mg Oral Q1400   Followed by   Derrill Memo ON 03/12/2022] methylPREDNISolone  4 mg Oral Q1400   methylPREDNISolone  16 mg Oral QHS   Followed by   Derrill Memo ON 03/10/2022] methylPREDNISolone  8 mg Oral QHS   Followed by   Derrill Memo ON 03/13/2022] methylPREDNISolone  4 mg Oral QHS   ondansetron (ZOFRAN) IV  4 mg Intravenous Q4H   Or   ondansetron  4 mg Oral Q4H   scopolamine  1 patch Transdermal Q72H    I have reviewed the patient's current medications.  ASSESSMENT: Principal Problem:   Hyperemesis   PLAN: - PICC line today - Replete K - Continue anti-emetics - Anticipate she will be here until next Monday at which point she plans termination. She completed her counseling and will be outside her 72 hour window at that time.    Radene Gunning, MD, [place] for [facility], [place]

## 2022-03-07 NOTE — H&P (Signed)
FACULTY PRACTICE ANTEPARTUM ADMISSION HISTORY AND PHYSICAL NOTE   History of Present Illness: Krista Stout is a 32 y.o. G2P0 at 31w5dadmitted for Severe HEG.  She was discharged this morning to be able to complete her 72 hour counseling which was completed in [place] She is scheduled for her procedure on 2/26 at 1145am.   Upon leaving she had emesis about every 15 minutes once her last dose of medications wore off from her hospitalization. She had Zofran ODT, scop patch and phenergan PR. These medications did not control her symptoms.   Her stomach hurts today due to the repeated emesis. She has no fever.   Patient Active Problem List   Diagnosis Date Noted   Hyperemesis 03/06/2022   Hyperemesis gravidarum 03/01/2022   Asthma with status asthmaticus    Pelvic mass 06/19/2018    Past Medical History:  Diagnosis Date   Asthma    Bacterial vaginitis    Cyclical vomiting syndrome     Past Surgical History:  Procedure Laterality Date   HERNIA REPAIR     RIGHT OOPHORECTOMY      OB History  Gravida Para Term Preterm AB Living  2            SAB IAB Ectopic Multiple Live Births               # Outcome Date GA Lbr Len/2nd Weight Sex Delivery Anes PTL Lv  2 Current           1 Gravida             Social History   Socioeconomic History   Marital status: Single    Spouse name: Not on file   Number of children: Not on file   Years of education: Not on file   Highest education level: Not on file  Occupational History   Not on file  Tobacco Use   Smoking status: Former   Smokeless tobacco: Never  Vaping Use   Vaping Use: Never used  Substance and Sexual Activity   Alcohol use: No   Drug use: No    Types: Marijuana    Comment: former   Sexual activity: Yes    Birth control/protection: None  Other Topics Concern   Not on file  Social History Narrative   Not on file   Social Determinants of Health   Financial Resource Strain: Not on file  Food Insecurity:  Food Insecurity Present (03/06/2022)   Hunger Vital Sign    Worried About Running Out of Food in the Last Year: Never true    Ran Out of Food in the Last Year: Sometimes true  Transportation Needs: Unmet Transportation Needs (03/06/2022)   PRAPARE - THydrologist(Medical): No    Lack of Transportation (Non-Medical): Yes  Physical Activity: Not on file  Stress: Not on file  Social Connections: Not on file    No family history on file.  No Known Allergies  Medications Prior to Admission  Medication Sig Dispense Refill Last Dose   metoCLOPramide (REGLAN) 10 MG tablet Take 1 tablet (10 mg total) by mouth every 6 (six) hours. 30 tablet 0    ondansetron (ZOFRAN-ODT) 4 MG disintegrating tablet Take 1 tablet (4 mg total) by mouth every 6 (six) hours as needed for nausea. 20 tablet 0    promethazine (PHENERGAN) 25 MG suppository Place 1 suppository (25 mg total) rectally every 6 (six) hours as needed for nausea or vomiting. 12  each 0    scopolamine (TRANSDERM-SCOP) 1 MG/3DAYS Place 1 patch (1.5 mg total) onto the skin every 3 (three) days. 10 patch 12     Review of Systems - Negative except as noted in HPI.   Vitals:  BP (!) 90/58 (BP Location: Left Arm)   Pulse 78   Temp 98 F (36.7 C) (Oral)   Resp 16   LMP 01/28/2022 (Exact Date)   SpO2 98%  Physical Examination: CONSTITUTIONAL: Well-developed female in no acute distress who appears her stated age.  HENT:  Normocephalic, atraumatic, External right and left ear normal. Oropharynx is clear and moist EYES: Conjunctivae and EOM are normal. Pupils are equal, round, and reactive to light. No scleral icterus.  NECK: Normal range of motion, supple, no masses SKIN: Skin is warm and dry. No rash noted. Not diaphoretic. No erythema. No pallor. NEUROLOGIC: Alert and oriented to person, place, and time. Normal reflexes, muscle tone coordination. No cranial nerve deficit noted. PSYCHIATRIC: Normal mood. Flat affect.  Normal behavior. Normal judgment and thought content. CARDIOVASCULAR: Normal heart rate noted RESPIRATORY: Effort normal ABDOMEN: Soft, nontender, non-distended MUSCULOSKELETAL: Normal range of motion. No edema and no tenderness. 2+ distal pulses.   Labs:  Results for orders placed or performed during the hospital encounter of 03/01/22 (from the past 24 hour(s))  Glucose, capillary   Collection Time: 03/06/22  3:57 AM  Result Value Ref Range   Glucose-Capillary 100 (H) 70 - 99 mg/dL    Imaging Studies: [place] EKG SITE RITE  Result Date: 03/06/2022 If Site Rite image not attached, placement could not be confirmed due to current cardiac rhythm.  [place] EKG SITE RITE  Result Date: 03/02/2022 If [facility] image not attached, placement could not be confirmed due to current cardiac rhythm.  US OB LESS THAN 14 WEEKS WITH OB TRANSVAGINAL  Result Date: 03/01/2022 CLINICAL DATA:  [id number] Abdominal pain during pregnancy in first trimester [id number] EXAM: OBSTETRIC <14 WK [place] AND TRANSVAGINAL OB US TECHNIQUE: Both transabdominal and transvaginal ultrasound examinations were performed for complete evaluation of the gestation as well as the maternal uterus, adnexal regions, and pelvic cul-de-sac. Transvaginal technique was performed to assess early pregnancy. COMPARISON:  06/19/2018 FINDINGS: Intrauterine gestational sac: Single Yolk sac:  Visualized. Embryo:  Visualized. Cardiac Activity: Visualized. Heart Rate: Unable to capture on ultrasound due to small size of embryo and patient motion during exam. MSD: 10.8 mm   5 w   6 d CRL:  1.8 mm Subchorionic hemorrhage:  None visualized. Maternal uterus/adnexae: Retroverted uterus. At the periphery of the endometrium is a focal multicystic area measuring 1.4 x 0.8 x 1.2 cm, indeterminate. This does not abut the gestational sac. Bilateral ovaries within normal limits. Small volume free fluid within the pelvis. The previously seen solid intrapelvic mass was not evident on  today's study. IMPRESSION: 1. Intrauterine gestational sac containing a yolk sac and small embryo (crown-rump length of 1.8 mm). Pregnancy measures approximally 5 weeks 6 days as calculated by mean sac diameter. The technologist reported visible cardiac activity during exam. A fetal heart rate was unable to be captured during exam due to small size of embryo and patient motion. Short-term follow-up ultrasound is recommended to assess viability. 2. At the periphery of the endometrium is a focal multicystic area measuring 1.4 x 0.8 x 1.2 cm, indeterminate. This does not abut the gestational sac. Attention on follow-up. 3. The previously seen solid intrapelvic mass was not evident on today's study. 4. Small volume free fluid within  the pelvis, which may be physiologic. Electronically Signed   By: Davina Poke D.O.   On: 03/01/2022 09:19     Assessment and Plan: Patient Active Problem List   Diagnosis Date Noted   Hyperemesis 03/06/2022   Hyperemesis gravidarum 03/01/2022   Asthma with status asthmaticus    Pelvic mass 06/19/2018   - Admit to Antenatal - Resume antiemetics from prior admission. Given she is anticipated to be here until next Monday, will restart the steroid taper.  - PO as tolerated - PICC line likely in AM. IV team notified of need for IV.  - Check CMET, Mg and Phos level. Previous free t4 was normal. Previous Qtc was normal (426).  - Lovenox and SCDs for VTE prophylaxis.   - Patient had a history of a "pelvic mass" but on recent US this is absent. In surgical history reported to have right oopherectomy but by [place] has her right ovary. Can clarify with pt during this admission.   Radene Gunning, MD, [place] Attending [place], [facility]

## 2022-03-07 NOTE — Progress Notes (Signed)
Peripherally Inserted Central Catheter Placement  The IV Nurse has discussed with the patient and/or persons authorized to consent for the patient, the purpose of this procedure and the potential benefits and risks involved with this procedure.  The benefits include less needle sticks, lab draws from the catheter, and the patient may be discharged home with the catheter. Risks include, but not limited to, infection, bleeding, blood clot (thrombus formation), and puncture of an artery; nerve damage and irregular heartbeat and possibility to perform a PICC exchange if needed/ordered by physician.  Alternatives to this procedure were also discussed.  Bard Power PICC patient education guide, fact sheet on infection prevention and patient information card has been provided to patient /or left at bedside.    PICC Placement Documentation  PICC Double Lumen [id number] Left Basilic 41 cm 0 cm (Active)  Indication for Insertion or Continuance of Line Poor Vasculature-patient has had multiple peripheral attempts or PIVs lasting less than 24 hours 03/07/22 1015  Exposed Catheter (cm) 0 cm 03/07/22 1015  Site Assessment Clean, Dry, Intact 03/07/22 1015  Lumen #1 Status Flushed;Saline locked;Blood return noted 03/07/22 1015  Lumen #2 Status Flushed;Saline locked;Blood return noted 03/07/22 1015  Dressing Type Transparent;Securing device 03/07/22 1015  Dressing Status Antimicrobial disc in place;Clean, Dry, Intact 03/07/22 1015  Safety Lock Not Applicable [id number]  Line Care Connections checked and tightened 03/07/22 1015  Line Adjustment (NICU/IV Team Only) No 03/07/22 1015  Dressing Intervention New dressing;Other (Comment) 03/07/22 1015  Dressing Change Due 03/14/22 03/07/22 1015       Enos Fling 03/07/2022, 10:16 AM

## 2022-03-08 DIAGNOSIS — R1110 Vomiting, unspecified: Secondary | ICD-10-CM

## 2022-03-08 LAB — BASIC METABOLIC PANEL
Anion gap: 6 (ref 5–15)
BUN: 6 mg/dL (ref 6–20)
CO2: 24 mmol/L (ref 22–32)
Calcium: 8.4 mg/dL — ABNORMAL LOW (ref 8.9–10.3)
Chloride: 101 mmol/L (ref 98–111)
Creatinine, Ser: 0.67 mg/dL (ref 0.44–1.00)
GFR, Estimated: >60 mL/min (ref >60)
Glucose, Bld: 126 mg/dL — ABNORMAL HIGH (ref 70–99)
Potassium: 4.2 mmol/L (ref 3.5–5.1)
Sodium: 131 mmol/L — ABNORMAL LOW (ref 135–145)

## 2022-03-08 LAB — GLUCOSE, CAPILLARY: Glucose-Capillary: 118 mg/dL — ABNORMAL HIGH (ref 70–99)

## 2022-03-08 MED ORDER — METHYLPREDNISOLONE 4 MG PO TABS
4.0000 mg | ORAL_TABLET | Freq: Every day | ORAL | Status: DC
  Filled 2022-03-08: qty 1

## 2022-03-08 MED ORDER — METHYLPREDNISOLONE SODIUM SUCC 40 MG IJ SOLR
16.0000 mg | Freq: Once | INTRAMUSCULAR | Status: DC
  Filled 2022-03-08: qty 0.4

## 2022-03-08 MED ORDER — METHYLPREDNISOLONE 4 MG PO TABS
8.0000 mg | ORAL_TABLET | Freq: Every day | ORAL | Status: DC
  Administered 2022-03-11 – 2022-03-12 (×2): 8 mg via ORAL
  Filled 2022-03-08 (×2): qty 2

## 2022-03-08 MED ORDER — METHYLPREDNISOLONE 4 MG PO TABS
8.0000 mg | ORAL_TABLET | Freq: Every day | ORAL | Status: DC
  Filled 2022-03-08: qty 2

## 2022-03-08 MED ORDER — METHYLPREDNISOLONE 4 MG PO TABS: 4.0000 mg | ORAL_TABLET | Freq: Every day | ORAL | Status: DC

## 2022-03-08 MED ORDER — METHYLPREDNISOLONE SODIUM SUCC 40 MG IJ SOLR
16.0000 mg | Freq: Every day | INTRAMUSCULAR | Status: AC
  Administered 2022-03-09 – 2022-03-10 (×2): 16 mg via INTRAVENOUS
  Filled 2022-03-08 (×2): qty 0.4

## 2022-03-08 MED ORDER — METHYLPREDNISOLONE 4 MG PO TABS
8.0000 mg | ORAL_TABLET | Freq: Every day | ORAL | Status: AC
  Administered 2022-03-11: 8 mg via ORAL
  Filled 2022-03-08: qty 2

## 2022-03-08 MED ORDER — METHYLPREDNISOLONE SODIUM SUCC 40 MG IJ SOLR
16.0000 mg | Freq: Every day | INTRAMUSCULAR | Status: AC
  Filled 2022-03-08: qty 0.4

## 2022-03-08 MED ORDER — METHYLPREDNISOLONE SODIUM SUCC 40 MG IJ SOLR
16.0000 mg | Freq: Every day | INTRAMUSCULAR | Status: AC
  Administered 2022-03-08 – 2022-03-09 (×2): 16 mg via INTRAVENOUS
  Filled 2022-03-08 (×2): qty 0.4

## 2022-03-08 MED ORDER — METHYLPREDNISOLONE SODIUM SUCC 40 MG IJ SOLR
16.0000 mg | Freq: Once | INTRAMUSCULAR | Status: AC
  Administered 2022-03-08: 16 mg via INTRAVENOUS
  Filled 2022-03-08: qty 0.4

## 2022-03-08 MED ORDER — METHYLPREDNISOLONE SODIUM SUCC 40 MG IJ SOLR
8.0000 mg | Freq: Every day | INTRAMUSCULAR | Status: AC
  Administered 2022-03-10: 8 mg via INTRAVENOUS
  Filled 2022-03-08: qty 0.2

## 2022-03-08 MED ORDER — METHYLPREDNISOLONE SODIUM SUCC 40 MG IJ SOLR
8.0000 mg | Freq: Every day | INTRAMUSCULAR | Status: AC
  Administered 2022-03-09 – 2022-03-10 (×2): 8 mg via INTRAVENOUS
  Filled 2022-03-08 (×2): qty 0.2

## 2022-03-08 NOTE — Progress Notes (Signed)
[place] COMPREHENSIVE PROGRESS NOTE  Krista Stout is a 32 y.o. G2P0 at 68w6dwho is admitted for HEG.  Estimated Date of Delivery: 10/26/22   Length of Stay:  2 Days. Admitted 03/06/2022  Subjective: Continued emesis overnight. Some lower abdominal and back cramping due to her amount of emesis yesterday she thinks. Cannot keep any PO down, wants her steroid meds switched back to IV mode.  Had PICC line placed yesterday.   Vitals:  Blood pressure 138/88, pulse (!) 58, temperature 98.1 F (36.7 C), temperature source Oral, resp. rate 14, height '5\' 3"'$  (1.6 m), weight 68 kg, last menstrual period 01/28/2022, SpO2 99 %. Physical Examination: CONSTITUTIONAL: Well-developed, well-nourished female in no acute distress.  NEUROLOGIC: Alert and oriented to person, place, and time. No cranial nerve deficit noted. PSYCHIATRIC: Normal mood and affect. Normal behavior. Normal judgment and thought content. CARDIOVASCULAR: Normal heart rate noted RESPIRATORY: Effort and breath sounds normal, no problems with respiration noted MUSCULOSKELETAL: Normal range of motion. No edema and no tenderness. 2+ distal pulses. ABDOMEN: Soft, nontender, nondistended   Results for orders placed or performed during the hospital encounter of 03/06/22 (from the past 48 hour(s))  CBC     Status: Abnormal   Collection Time: 03/06/22 11:50 PM  Result Value Ref Range   WBC 10.6 (H) 4.0 - 10.5 K/uL   RBC 4.51 3.87 - 5.11 MIL/uL   Hemoglobin 14.5 12.0 - 15.0 g/dL   HCT 42.9 36.0 - 46.0 %   MCV 95.1 80.0 - 100.0 fL   MCH 32.2 26.0 - 34.0 pg   MCHC 33.8 30.0 - 36.0 g/dL   RDW 13.0 11.5 - 15.5 %   Platelets 307 150 - 400 K/uL   nRBC 0.0 0.0 - 0.2 %    Comment: Performed at [facility] [address], [place] [address] Type and screen MMarcus    Status: None   Collection Time: 03/07/22  1:51 AM  Result Value Ref Range   ABO/RH(D) O POS    Antibody Screen NEG     Sample Expiration      03/10/2022,2359 Performed at [facility] [address] [place] [address]  Comprehensive metabolic panel     Status: Abnormal   Collection Time: 03/07/22  1:57 AM  Result Value Ref Range   Sodium 134 (L) 135 - 145 mmol/L   Potassium 2.9 (L) 3.5 - 5.1 mmol/L   Chloride 100 98 - 111 mmol/L   CO2 23 22 - 32 mmol/L   Glucose, Bld 99 70 - 99 mg/dL    Comment: Glucose reference range applies only to samples taken after fasting for at least 8 hours.   BUN 10 6 - 20 mg/dL   Creatinine, Ser 0.66 0.44 - 1.00 mg/dL   Calcium 8.5 (L) 8.9 - 10.3 mg/dL   Total Protein 6.6 6.5 - 8.1 g/dL   Albumin 3.4 (L) 3.5 - 5.0 g/dL   AST 20 15 - 41 U/L   ALT 18 0 - 44 U/L   Alkaline Phosphatase 64 38 - 126 U/L   Total Bilirubin 1.1 0.3 - 1.2 mg/dL   GFR, Estimated >60 >60 mL/min    Comment: (NOTE) Calculated using the CKD-EPI Creatinine Equation (2021)    Anion gap 11 5 - 15    Comment: Performed at [facility] [address] [place] [address] Magnesium     Status:  None   Collection Time: 03/07/22  1:57 AM  Result Value Ref Range   Magnesium 1.7 1.7 - 2.4 mg/dL    Comment: Performed at [facility] [address], [place], [place] [address]  Phosphorus     Status: None   Collection Time: 03/07/22  1:57 AM  Result Value Ref Range   Phosphorus 3.6 2.5 - 4.6 mg/dL    Comment: Performed at [facility] [address] [place], [place] [address]  Glucose, capillary     Status: Abnormal   Collection Time: 03/07/22  4:13 AM  Result Value Ref Range   Glucose-Capillary 101 (H) 70 - 99 mg/dL    Comment: Glucose reference range applies only to samples taken after fasting for at least 8 hours.   Comment 1 Notify RN   Glucose, capillary     Status: Abnormal   Collection Time: 03/08/22  4:39 AM  Result Value Ref Range   Glucose-Capillary 118 (H) 70 - 99 mg/dL    Comment: Glucose reference range applies only to samples taken after  fasting for at least 8 hours.  Basic metabolic panel     Status: Abnormal   Collection Time: 03/08/22  4:57 AM  Result Value Ref Range   Sodium 131 (L) 135 - 145 mmol/L   Potassium 4.2 3.5 - 5.1 mmol/L   Chloride 101 98 - 111 mmol/L   CO2 24 22 - 32 mmol/L   Glucose, Bld 126 (H) 70 - 99 mg/dL    Comment: Glucose reference range applies only to samples taken after fasting for at least 8 hours.   BUN 6 6 - 20 mg/dL   Creatinine, Ser 0.67 0.44 - 1.00 mg/dL   Calcium 8.4 (L) 8.9 - 10.3 mg/dL   GFR, Estimated >60 >60 mL/min    Comment: (NOTE) Calculated using the CKD-EPI Creatinine Equation (2021)    Anion gap 6 5 - 15    Comment: Performed at [facility] [address], [place], [place] [address]    [place] EKG SITE RITE  Result Date: 03/06/2022 If [facility] image not attached, placement could not be confirmed due to current cardiac rhythm.   Current scheduled medications  Chlorhexidine Gluconate Cloth  6 each Topical Daily   enoxaparin (LOVENOX) injection  40 mg Subcutaneous Q24H   [START ON 03/09/2022] methylPREDNISolone (SOLU-MEDROL) injection  16 mg Intravenous Q breakfast   Followed by   Derrill Memo ON 03/11/2022] methylPREDNISolone  8 mg Oral Q breakfast   Followed by   Derrill Memo ON 03/18/2022] methylPREDNISolone  4 mg Oral Q breakfast   [START ON 03/09/2022] methylPREDNISolone (SOLU-MEDROL) injection  8 mg Intravenous Daily   Followed by   Derrill Memo ON 03/11/2022] methylPREDNISolone  8 mg Oral Daily   Followed by   Derrill Memo ON 03/12/2022] methylPREDNISolone  4 mg Oral Daily   methylPREDNISolone (SOLU-MEDROL) injection  16 mg Intravenous QHS   Followed by   Derrill Memo ON 03/10/2022] methylPREDNISolone (SOLU-MEDROL) injection  8 mg Intravenous QHS   Followed by   Derrill Memo ON 03/11/2022] methylPREDNISolone  8 mg Oral QHS   Followed by   Derrill Memo ON 03/13/2022] methylPREDNISolone  4 mg Oral QHS   ondansetron (ZOFRAN) IV  4 mg Intravenous Q4H   Or   ondansetron  4 mg Oral Q4H   scopolamine  1 patch  Transdermal Q72H   sodium chloride flush  10-40 mL Intracatheter Q12H    I have reviewed the patient's current medications.  ASSESSMENT: Principal Problem:  Hyperemesis   PLAN: - PICC line in place - Stable K, continue daily CMP - Continue anti-emetics - Anticipate she will be here until next Monday at which point she plans termination. She completed her counseling and will be outside her 72 hour window at that time.    Verita Schneiders, MD, [place] for [employer], [place]

## 2022-03-08 NOTE — Progress Notes (Signed)
Pharmacy Consult:   MEDROL (METHYLPREDNISOLONE) TAPER  FOR HYPEREMESIS GRAVIDARUM PATIENTS  The following is a 14 day taper of methylprednisolone for hyperemesis. Doses on day 1  will be given IV.  All doses starting on day 2  will be given PO. (If patient cannot tolerate oral medications, contact the pharmacy to change route to IV.)   Days 1-5 have been adjusted to IV doses due to patients extreme intolerability to PO doses.  Date Day Morning Midday Bedtime  2/21 1 - - 48 mg  2/'22 2 16 '$ mg 16 mg 16 mg  2/'23 3 16 '$ mg 16 mg 16 mg  2/'24 4 16 '$ mg 8 mg 16 mg  2/'25 5 16 '$ mg 8 mg 8 mg  2/'26 6 8 '$ mg 8 mg 8 mg  2/'27 7 8 '$ mg 4 mg 8 mg  2/'28 8 8 '$ mg 4 mg 4 mg  2/'29 9 8 '$ mg 4 mg   3/'1 10 8 '$ mg 4 mg   3/'2 11 8 '$ mg    3/'3 12 8 '$ mg    3/'4 13 4 '$ mg    3/'5 14 4 '$ mg     Check fasting blood sugars daily while on the taper. Notify MD if fasting blood sugar>95.  Krista Stout Krista Stout 03/08/2022

## 2022-03-09 DIAGNOSIS — Z3A01 Less than 8 weeks gestation of pregnancy: Secondary | ICD-10-CM

## 2022-03-09 LAB — COMPREHENSIVE METABOLIC PANEL
ALT: 11 U/L (ref 0–44)
AST: 10 U/L — ABNORMAL LOW (ref 15–41)
Albumin: 2.9 g/dL — ABNORMAL LOW (ref 3.5–5.0)
Alkaline Phosphatase: 53 U/L (ref 38–126)
Anion gap: 8 (ref 5–15)
BUN: 5 mg/dL — ABNORMAL LOW (ref 6–20)
CO2: 22 mmol/L (ref 22–32)
Calcium: 7.7 mg/dL — ABNORMAL LOW (ref 8.9–10.3)
Chloride: 105 mmol/L (ref 98–111)
Creatinine, Ser: 0.6 mg/dL (ref 0.44–1.00)
GFR, Estimated: >60 mL/min (ref >60)
Glucose, Bld: 102 mg/dL — ABNORMAL HIGH (ref 70–99)
Potassium: 5 mmol/L (ref 3.5–5.1)
Sodium: 135 mmol/L (ref 135–145)
Total Bilirubin: 0.7 mg/dL (ref 0.3–1.2)
Total Protein: 5.8 g/dL — ABNORMAL LOW (ref 6.5–8.1)

## 2022-03-09 LAB — GLUCOSE, CAPILLARY
Glucose-Capillary: 123 mg/dL — ABNORMAL HIGH (ref 70–99)
Glucose-Capillary: 169 mg/dL — ABNORMAL HIGH (ref 70–99)

## 2022-03-09 MED ORDER — TRIMETHOBENZAMIDE HCL 100 MG/ML IM SOLN
200.0000 mg | Freq: Four times a day (QID) | INTRAMUSCULAR | Status: DC
  Administered 2022-03-09 – 2022-03-12 (×11): 200 mg via INTRAMUSCULAR
  Filled 2022-03-09 (×14): qty 2

## 2022-03-09 MED ORDER — LACTATED RINGERS IV SOLN: INTRAVENOUS | Status: AC

## 2022-03-09 MED ORDER — BOOST / RESOURCE BREEZE PO LIQD CUSTOM
1.0000 | Freq: Three times a day (TID) | ORAL | Status: DC
  Administered 2022-03-10 – 2022-03-11 (×3): 1 via ORAL
  Filled 2022-03-09 (×8): qty 1

## 2022-03-09 MED ORDER — INSULIN ASPART 100 UNIT/ML IJ SOLN
0.0000 [IU] | Freq: Four times a day (QID) | INTRAMUSCULAR | Status: DC
  Administered 2022-03-09 – 2022-03-10 (×2): 2 [IU] via SUBCUTANEOUS
  Administered 2022-03-10: 1 [IU] via SUBCUTANEOUS
  Administered 2022-03-10: 2 [IU] via SUBCUTANEOUS
  Administered 2022-03-10 – 2022-03-11 (×2): 1 [IU] via SUBCUTANEOUS
  Administered 2022-03-11: 2 [IU] via SUBCUTANEOUS

## 2022-03-09 MED ORDER — TRAVASOL 10 % IV SOLN
INTRAVENOUS | Status: AC
  Filled 2022-03-09: qty 403.2

## 2022-03-09 NOTE — Progress Notes (Addendum)
Initial Nutrition Assessment  DOCUMENTATION CODES:  Not applicable  INTERVENTION:  Regular Diet - boost breeze TID Monitor PO intake to see if pharmacologic management improves nausea/vomiting/PO intake  100 mg thiamin IV q day if no parenteral support initiated   NUTRITION DIAGNOSIS:  Inadequate oral intake related to   as evidenced by energy intake < or equal to 50% for > or equal to 5 days.  GOAL:  Patient will meet greater than or equal to 90% of their needs  MONITOR:  Labs, PO intake  REASON FOR ASSESSMENT:  Antenatal, Consult Assessment of nutrition requirement/status  ASSESSMENT:  Now 7 0/7 weeks IUP, adm with hyperemesis. Chart review indicates very marginal PO intake X 8 days and likely meets criteria for moderate degree of malnutrition. Parenteral support under consideration if there is no improvement in ability to tolerate PO's. Start at 1/2 goal to reduce risk of refeeding. Pt to be discharge Monday and PO tolerance will likely improve after planned procedure.  Diet Order:   Diet Order             Diet regular Room service appropriate? Yes; Fluid consistency: Thin  Diet effective now                   EDUCATION NEEDS:  No education needs have been identified at this time  Skin:  Skin Assessment: Reviewed RN Assessment  Height:   Ht Readings from Last 1 Encounters:  03/06/22 '5\' 3"'$  (1.6 m)    Weight:   Wt Readings from Last 1 Encounters:  03/06/22 68 kg    Ideal Body Weight:   115 lbs  BMI:  Body mass index is 26.56 kg/m.  Estimated Nutritional Needs:   Kcal:  1800-2000  Protein:  75-85 g  Fluid:  >2 L

## 2022-03-09 NOTE — Progress Notes (Signed)
[place] COMPREHENSIVE PROGRESS NOTE  Krista Stout is a 32 y.o. G2P0 at 40w0dwho is admitted for HEG.  Estimated Date of Delivery: 10/26/22   Length of Stay:  3 Days. Admitted 03/06/2022  Subjective: Continued emesis. Cannot keep any PO down, she is starving. Keeps forcing herself to eat but throws up immediately. No nutrition in several days.   Vitals:  Blood pressure 108/70, pulse 84, temperature 97.7 F (36.5 C), temperature source Oral, resp. rate 17, height '5\' 3"'$  (1.6 m), weight 68 kg, last menstrual period 01/28/2022, SpO2 100 %. Physical Examination: CONSTITUTIONAL: Well-developed, well-nourished female in no acute distress.  NEUROLOGIC: Alert and oriented to person, place, and time. No cranial nerve deficit noted. PSYCHIATRIC: Normal mood and affect. Normal behavior. Normal judgment and thought content. CARDIOVASCULAR: Normal heart rate noted RESPIRATORY: Effort and breath sounds normal, no problems with respiration noted MUSCULOSKELETAL: Normal range of motion. No edema and no tenderness. 2+ distal pulses. ABDOMEN: Soft, nontender, nondistended   Results for orders placed or performed during the hospital encounter of 03/06/22 (from the past 48 hour(s))  Glucose, capillary     Status: Abnormal   Collection Time: 03/08/22  4:39 AM  Result Value Ref Range   Glucose-Capillary 118 (H) 70 - 99 mg/dL    Comment: Glucose reference range applies only to samples taken after fasting for at least 8 hours.  Basic metabolic panel     Status: Abnormal   Collection Time: 03/08/22  4:57 AM  Result Value Ref Range   Sodium 131 (L) 135 - 145 mmol/L   Potassium 4.2 3.5 - 5.1 mmol/L   Chloride 101 98 - 111 mmol/L   CO2 24 22 - 32 mmol/L   Glucose, Bld 126 (H) 70 - 99 mg/dL    Comment: Glucose reference range applies only to samples taken after fasting for at least 8 hours.   BUN 6 6 - 20 mg/dL   Creatinine, Ser 0.67 0.44 - 1.00 mg/dL   Calcium 8.4 (L) 8.9 - 10.3 mg/dL    GFR, Estimated >60 >60 mL/min    Comment: (NOTE) Calculated using the CKD-EPI Creatinine Equation (2021)    Anion gap 6 5 - 15    Comment: Performed at [place], [id number] Glucose, capillary     Status: Abnormal   Collection Time: 03/09/22  5:01 AM  Result Value Ref Range   Glucose-Capillary 123 (H) 70 - 99 mg/dL    Comment: Glucose reference range applies only to samples taken after fasting for at least 8 hours.   Comment 1 Notify RN     No results found.  Current scheduled medications  Chlorhexidine Gluconate Cloth  6 each Topical Daily   enoxaparin (LOVENOX) injection  40 mg Subcutaneous Q24H   methylPREDNISolone (SOLU-MEDROL) injection  16 mg Intravenous Q breakfast   Followed by   [Derrill MemoON 03/11/2022] methylPREDNISolone  8 mg Oral Q breakfast   Followed by   [Derrill MemoON 03/18/2022] methylPREDNISolone  4 mg Oral Q breakfast   methylPREDNISolone (SOLU-MEDROL) injection  8 mg Intravenous Daily   Followed by   [Derrill MemoON 03/11/2022] methylPREDNISolone  8 mg Oral Daily   Followed by   [Derrill MemoON 03/12/2022] methylPREDNISolone  4 mg Oral Daily   methylPREDNISolone (SOLU-MEDROL) injection  16 mg Intravenous QHS   Followed by   [Derrill MemoON 03/10/2022] methylPREDNISolone (SOLU-MEDROL) injection  8 mg Intravenous QHS   Followed by   [Derrill MemoON 03/11/2022]  methylPREDNISolone  8 mg Oral QHS   Followed by   Derrill Memo ON 03/13/2022] methylPREDNISolone  4 mg Oral QHS   ondansetron (ZOFRAN) IV  4 mg Intravenous Q4H   Or   ondansetron  4 mg Oral Q4H   scopolamine  1 patch Transdermal Q72H   sodium chloride flush  10-40 mL Intracatheter Q12H    I have reviewed the patient's current medications.  ASSESSMENT: Principal Problem:   Hyperemesis gravidarum Active Problems:   [redacted] weeks gestation of pregnancy   PLAN: - PICC line in place - Discussed nutritional status with Pharmacy, we will see hat interventions can be done for her for the next couple of  days. - Stable K, continue daily CMP - Continue anti-emetics - Anticipate she will be here until next Monday at which point she plans termination. She completed her counseling and will be outside her 72 hour window at that time.    Verita Schneiders, MD, [place] for [employer], [place]

## 2022-03-09 NOTE — Progress Notes (Addendum)
PHARMACY - TOTAL PARENTERAL NUTRITION CONSULT NOTE   Indication:  Hyperemesis gravidarum  Patient Measurements: Height: '5\' 3"'$  (160 cm) Weight: 68 kg (149 lb 14.6 oz) IBW/kg (Calculated) : 52.4 TPN AdjBW (KG): 56.3 Body mass index is 26.56 kg/m. Usual Weight: 68 kg  Assessment:  32 yo F admitted for hyperemesis gravidarum. Pt is 25w0dinto pregnancy with estimated delivery date of 10/26/22. Pt has been unable to tolerate any food - she vomits immediately after eating. She was previously admitted 2/16 for hyperemesis gravidarum and discharged home on 2/21. Pt has had poor nutrition over several days. PICC line was placed on 2/22 due to her severe dehydration and inability to place peripheral IV for IV hydration and medications. Pharmacy consulted to initiate TPN.  Glucose / Insulin: cBG <140, not diabetic Electrolytes: K 5, coCa 8.6, all others WNL Renal: SCr 0.6, BUN 5 Hepatic: AST/ALT/Alk Phos/T bili WNL, albumin 2.9 Intake / Output; MIVF: Urine output: 3x/24h, emesis: 2574m LBM: 2/23 GI Imaging: None  GI Surgeries / Procedures:  None  Central access: PICC 2/22 TPN start date: 03/09/21  Nutritional Goals: Goal TPN rate is 80 mL/hr (provides 80.6 g of protein and 1910 kcals per day)  RD Assessment: Estimated Needs Total Energy Estimated Needs: 1800-2000 Total Protein Estimated Needs: 75-85 g Total Fluid Estimated Needs: >2 L  Current Nutrition:  Regular diet and TPN  Plan:  Start TPN at 4020mr at 1800. TPN to provide 40g protein and 955 kcal meeting ~50% of nutritional needs. Electrolytes in TPN: Na 100m29m, K 50mE78m Ca 5mEq/12mMg 10mEq/28mnd Phos 25mmol/40ml:Ac 1:2 Add standard MVI and trace elements to TPN Add thiamine '100mg'$  and folic acid '1mg'$  to TPN due to pregnancy and high risk of refeeding syndrome Add famotidine '40mg'$  to TPN and discontinue IV famotidine outside TPN Initiate Sensitive q6h SSI and adjust as needed  Reduce MIVF to 85 mL/hr at 1800 Monitor TPN  labs on Mon/Thurs, and daily until stabilized on TPN Monitor response to trimethobenzamide and ability to tolerate PO food  Eiden Bagot JaLuisa Hart, BCPS Clinical Pharmacist 03/09/2022 12:03 PM   Please refer to [facility] phone number

## 2022-03-09 NOTE — Plan of Care (Signed)
  Problem: Education: Goal: Knowledge of General Education information will improve Description: Including pain rating scale, medication(s)/side effects and non-pharmacologic comfort measures Outcome: Progressing   Problem: Health Behavior/Discharge Planning: Goal: Ability to manage health-related needs will improve Outcome: Progressing   Problem: Clinical Measurements: Goal: Ability to maintain clinical measurements within normal limits will improve Outcome: Progressing Goal: Will remain free from infection Outcome: Progressing Goal: Diagnostic test results will improve Outcome: Progressing Goal: Respiratory complications will improve Outcome: Progressing Goal: Cardiovascular complication will be avoided Outcome: Progressing   Problem: Activity: Goal: Risk for activity intolerance will decrease Outcome: Progressing   Problem: Nutrition: Goal: Adequate nutrition will be maintained Outcome: Progressing   Problem: Coping: Goal: Level of anxiety will decrease Outcome: Progressing   Problem: Elimination: Goal: Will not experience complications related to bowel motility Outcome: Progressing Goal: Will not experience complications related to urinary retention Outcome: Progressing   Problem: Pain Managment: Goal: General experience of comfort will improve Outcome: Progressing   Problem: Safety: Goal: Ability to remain free from injury will improve Outcome: Progressing   Problem: Skin Integrity: Goal: Risk for impaired skin integrity will decrease Outcome: Progressing   Problem: Education: Goal: Knowledge of disease or condition will improve Outcome: Progressing Goal: Knowledge of the prescribed therapeutic regimen will improve Outcome: Progressing Goal: Individualized Educational Video(s) Outcome: Progressing   Problem: Clinical Measurements: Goal: Complications related to the disease process, condition or treatment will be avoided or minimized Outcome:  Progressing   Problem: Education: Goal: Knowledge of disease or condition will improve Outcome: Progressing Goal: Knowledge of the prescribed therapeutic regimen will improve Outcome: Progressing   Problem: Bowel/Gastric: Goal: Occurences of nausea and/or vomiting will decrease Outcome: Progressing   Problem: Fluid Volume: Goal: Maintenance of adequate hydration will improve Outcome: Progressing   Problem: Nutritional: Goal: Achievement of adequate weight for body size and type will improve Outcome: Progressing

## 2022-03-10 DIAGNOSIS — Z789 Other specified health status: Secondary | ICD-10-CM | POA: Diagnosis not present

## 2022-03-10 LAB — PHOSPHORUS: Phosphorus: 3.4 mg/dL (ref 2.5–4.6)

## 2022-03-10 LAB — COMPREHENSIVE METABOLIC PANEL
ALT: 14 U/L (ref 0–44)
AST: 17 U/L (ref 15–41)
Albumin: 3.4 g/dL — ABNORMAL LOW (ref 3.5–5.0)
Alkaline Phosphatase: 63 U/L (ref 38–126)
Anion gap: 10 (ref 5–15)
BUN: 6 mg/dL (ref 6–20)
CO2: 25 mmol/L (ref 22–32)
Calcium: 8.9 mg/dL (ref 8.9–10.3)
Chloride: 98 mmol/L (ref 98–111)
Creatinine, Ser: 0.62 mg/dL (ref 0.44–1.00)
GFR, Estimated: >60 mL/min (ref >60)
Glucose, Bld: 127 mg/dL — ABNORMAL HIGH (ref 70–99)
Potassium: 3.3 mmol/L — ABNORMAL LOW (ref 3.5–5.1)
Sodium: 133 mmol/L — ABNORMAL LOW (ref 135–145)
Total Bilirubin: 0.4 mg/dL (ref 0.3–1.2)
Total Protein: 6.6 g/dL (ref 6.5–8.1)

## 2022-03-10 LAB — GLUCOSE, CAPILLARY
Glucose-Capillary: 123 mg/dL — ABNORMAL HIGH (ref 70–99)
Glucose-Capillary: 146 mg/dL — ABNORMAL HIGH (ref 70–99)
Glucose-Capillary: 164 mg/dL — ABNORMAL HIGH (ref 70–99)
Glucose-Capillary: 169 mg/dL — ABNORMAL HIGH (ref 70–99)

## 2022-03-10 LAB — TRIGLYCERIDES: Triglycerides: 49 mg/dL (ref <150)

## 2022-03-10 LAB — MAGNESIUM: Magnesium: 2 mg/dL (ref 1.7–2.4)

## 2022-03-10 MED ORDER — POTASSIUM CHLORIDE 10 MEQ/100ML IV SOLN
10.0000 meq | INTRAVENOUS | Status: AC
  Administered 2022-03-10 (×6): 10 meq via INTRAVENOUS
  Filled 2022-03-10 (×6): qty 100

## 2022-03-10 MED ORDER — TRAVASOL 10 % IV SOLN
INTRAVENOUS | Status: AC
  Filled 2022-03-10: qty 806.4

## 2022-03-10 MED ORDER — LACTATED RINGERS IV SOLN: INTRAVENOUS | Status: DC

## 2022-03-10 NOTE — Plan of Care (Signed)
  Problem: Education: Goal: Knowledge of General Education information will improve Description: Including pain rating scale, medication(s)/side effects and non-pharmacologic comfort measures Outcome: Progressing   Problem: Health Behavior/Discharge Planning: Goal: Ability to manage health-related needs will improve Outcome: Progressing   Problem: Clinical Measurements: Goal: Ability to maintain clinical measurements within normal limits will improve Outcome: Progressing Goal: Will remain free from infection Outcome: Progressing Goal: Diagnostic test results will improve Outcome: Progressing Goal: Respiratory complications will improve Outcome: Progressing Goal: Cardiovascular complication will be avoided Outcome: Progressing   Problem: Activity: Goal: Risk for activity intolerance will decrease Outcome: Progressing   Problem: Nutrition: Goal: Adequate nutrition will be maintained Outcome: Progressing   Problem: Coping: Goal: Level of anxiety will decrease Outcome: Progressing   Problem: Elimination: Goal: Will not experience complications related to bowel motility Outcome: Progressing Goal: Will not experience complications related to urinary retention Outcome: Progressing   Problem: Pain Managment: Goal: General experience of comfort will improve Outcome: Progressing   Problem: Safety: Goal: Ability to remain free from injury will improve Outcome: Progressing   Problem: Skin Integrity: Goal: Risk for impaired skin integrity will decrease Outcome: Progressing   Problem: Education: Goal: Knowledge of disease or condition will improve Outcome: Progressing Goal: Knowledge of the prescribed therapeutic regimen will improve Outcome: Progressing Goal: Individualized Educational Video(s) Outcome: Progressing   Problem: Clinical Measurements: Goal: Complications related to the disease process, condition or treatment will be avoided or minimized Outcome:  Progressing   Problem: Education: Goal: Knowledge of disease or condition will improve Outcome: Progressing Goal: Knowledge of the prescribed therapeutic regimen will improve Outcome: Progressing   Problem: Bowel/Gastric: Goal: Occurences of nausea and/or vomiting will decrease Outcome: Progressing   Problem: Fluid Volume: Goal: Maintenance of adequate hydration will improve Outcome: Progressing   Problem: Nutritional: Goal: Achievement of adequate weight for body size and type will improve Outcome: Progressing

## 2022-03-10 NOTE — Progress Notes (Signed)
[facility] [place] [facility] COMPREHENSIVE PROGRESS NOTE  Krista Stout is a 32 y.o. G2P0 at 46w1dwho is admitted for HEG.  Estimated Date of Delivery: 10/26/22.   Length of Stay:  4 Days. Admitted 03/06/2022  Subjective: Was started on TPN yesterday.   Continued emesis. Cannot keep any PO down, even fluids. Keeps forcing herself to try but throws up immediately. No enteral nutrition in several days.   Vitals:  Blood pressure 128/73, pulse 76, temperature 98.5 F (36.9 C), temperature source Oral, resp. rate 18, height '5\' 3"'$  (1.6 m), weight 68 kg, last menstrual period 01/28/2022, SpO2 100 %. Physical Examination: CONSTITUTIONAL: Well-developed, well-nourished female in no acute distress.  NEUROLOGIC: Alert and oriented to person, place, and time. No cranial nerve deficit noted. PSYCHIATRIC: Normal mood and affect. Normal behavior. Normal judgment and thought content. CARDIOVASCULAR: Normal heart rate noted RESPIRATORY: Effort and breath sounds normal, no problems with respiration noted MUSCULOSKELETAL: Normal range of motion. No edema and no tenderness. 2+ distal pulses. ABDOMEN: Soft, nontender, nondistended   Results for orders placed or performed during the hospital encounter of 03/06/22 (from the past 48 hour(s))  Glucose, capillary     Status: Abnormal   Collection Time: 03/09/22  5:01 AM  Result Value Ref Range   Glucose-Capillary 123 (H) 70 - 99 mg/dL    Comment: Glucose reference range applies only to samples taken after fasting for at least 8 hours.   Comment 1 Notify RN   Comprehensive metabolic panel     Status: Abnormal   Collection Time: 03/09/22 10:05 AM  Result Value Ref Range   Sodium 135 135 - 145 mmol/L   Potassium 5.0 3.5 - 5.1 mmol/L   Chloride 105 98 - 111 mmol/L   CO2 22 22 - 32 mmol/L   Glucose, Bld 102 (H) 70 - 99 mg/dL    Comment: Glucose reference range applies only to samples taken after fasting for at least 8 hours.   BUN 5 (L) 6 - 20 mg/dL    Creatinine, Ser 0.60 0.44 - 1.00 mg/dL   Calcium 7.7 (L) 8.9 - 10.3 mg/dL   Total Protein 5.8 (L) 6.5 - 8.1 g/dL   Albumin 2.9 (L) 3.5 - 5.0 g/dL   AST 10 (L) 15 - 41 U/L   ALT 11 0 - 44 U/L   Alkaline Phosphatase 53 38 - 126 U/L   Total Bilirubin 0.7 0.3 - 1.2 mg/dL   GFR, Estimated >60 >60 mL/min    Comment: (NOTE) Calculated using the CKD-EPI Creatinine Equation (2021)    Anion gap 8 5 - 15    Comment: Performed at [facility] [address] [id number] Glucose, capillary     Status: Abnormal   Collection Time: 03/09/22  6:10 PM  Result Value Ref Range   Glucose-Capillary 169 (H) 70 - 99 mg/dL    Comment: Glucose reference range applies only to samples taken after fasting for at least 8 hours.  Glucose, capillary     Status: Abnormal   Collection Time: 03/10/22 12:32 AM  Result Value Ref Range   Glucose-Capillary 164 (H) 70 - 99 mg/dL    Comment: Glucose reference range applies only to samples taken after fasting for at least 8 hours.  Comprehensive metabolic panel     Status: Abnormal   Collection Time: 03/10/22  5:04 AM  Result Value Ref Range   Sodium 133 (L) 135 - 145 mmol/L   Potassium 3.3 (L) 3.5 -  5.1 mmol/L   Chloride 98 98 - 111 mmol/L   CO2 25 22 - 32 mmol/L   Glucose, Bld 127 (H) 70 - 99 mg/dL    Comment: Glucose reference range applies only to samples taken after fasting for at least 8 hours.   BUN 6 6 - 20 mg/dL   Creatinine, Ser 0.62 0.44 - 1.00 mg/dL   Calcium 8.9 8.9 - 10.3 mg/dL   Total Protein 6.6 6.5 - 8.1 g/dL   Albumin 3.4 (L) 3.5 - 5.0 g/dL   AST 17 15 - 41 U/L   ALT 14 0 - 44 U/L   Alkaline Phosphatase 63 38 - 126 U/L   Total Bilirubin 0.4 0.3 - 1.2 mg/dL   GFR, Estimated >60 >60 mL/min    Comment: (NOTE) Calculated using the CKD-EPI Creatinine Equation (2021)    Anion gap 10 5 - 15    Comment: Performed at [facility] [address], [place], [place] [address]  Magnesium     Status: None   Collection Time:  03/10/22  5:04 AM  Result Value Ref Range   Magnesium 2.0 1.7 - 2.4 mg/dL    Comment: Performed at [facility] [address], [place], [place] [address]  Phosphorus     Status: None   Collection Time: 03/10/22  5:04 AM  Result Value Ref Range   Phosphorus 3.4 2.5 - 4.6 mg/dL    Comment: Performed at [facility] [address] [place], [place] [address]  Triglycerides     Status: None   Collection Time: 03/10/22  5:04 AM  Result Value Ref Range   Triglycerides 49 <150 mg/dL    Comment: Performed at [facility] [address] [place], [place] [address]  Glucose, capillary     Status: Abnormal   Collection Time: 03/10/22  6:30 AM  Result Value Ref Range   Glucose-Capillary 146 (H) 70 - 99 mg/dL    Comment: Glucose reference range applies only to samples taken after fasting for at least 8 hours.    No results found.  Current scheduled medications  Chlorhexidine Gluconate Cloth  6 each Topical Daily   enoxaparin (LOVENOX) injection  40 mg Subcutaneous Q24H   feeding supplement  1 Container Oral TID BM   insulin aspart  0-9 Units Subcutaneous Q6H   methylPREDNISolone (SOLU-MEDROL) injection  16 mg Intravenous Q breakfast   Followed by   Derrill Memo ON 03/11/2022] methylPREDNISolone  8 mg Oral Q breakfast   Followed by   Derrill Memo ON 03/18/2022] methylPREDNISolone  4 mg Oral Q breakfast   methylPREDNISolone (SOLU-MEDROL) injection  8 mg Intravenous Daily   Followed by   Derrill Memo ON 03/11/2022] methylPREDNISolone  8 mg Oral Daily   Followed by   Derrill Memo ON 03/12/2022] methylPREDNISolone  4 mg Oral Daily   methylPREDNISolone (SOLU-MEDROL) injection  8 mg Intravenous QHS   Followed by   Derrill Memo ON 03/11/2022] methylPREDNISolone  8 mg Oral QHS   Followed by   Derrill Memo ON 03/13/2022] methylPREDNISolone  4 mg Oral QHS   scopolamine  1 patch Transdermal Q72H   sodium chloride flush  10-40 mL Intracatheter Q12H   trimethobenzamide  200 mg Intramuscular Q6H    I have reviewed  the patient's current medications.  ASSESSMENT: Principal Problem:   Hyperemesis gravidarum Active Problems:   [redacted] weeks gestation of pregnancy   On total parenteral nutrition (TPN)   PLAN: - Continue TPN as recommended by Nutrition team, appreciate  their input. PICC line in place - Continue daily CMP and labs - Continue anti-emetics - Anticipate she will be here until Monday at which point she plans termination. She completed her counseling and will be outside her 72 hour window at that time.  Plan is to fo discharge tomorrow morning.   Verita Schneiders, MD, [place] for [employer], [place]

## 2022-03-10 NOTE — Progress Notes (Signed)
PHARMACY - TOTAL PARENTERAL NUTRITION CONSULT NOTE   Indication:  Hyperemesis gravidarum  Patient Measurements: Height: '5\' 3"'$  (160 cm) Weight: 68 kg (149 lb 14.6 oz) IBW/kg (Calculated) : 52.4 TPN AdjBW (KG): 56.3 Body mass index is 26.56 kg/m. Usual Weight: 68 kg  Assessment:  32 yo F admitted for hyperemesis gravidarum. Pt is 89w0dinto pregnancy with estimated delivery date of 10/26/22. Pt has been unable to tolerate any food - she vomits immediately after eating. She was previously admitted 2/16 for hyperemesis gravidarum and discharged home on 2/21. Pt has had poor nutrition over several days. PICC line was placed on 2/22 due to her severe dehydration and inability to place peripheral IV for IV hydration and medications. Pharmacy consulted to initiate TPN.  Pt unable to tolerate any oral liquids on 2/24.  Glucose / Insulin: cBG <180, not diabetic, on steroids for nausea, required 5 units insulin/24h Electrolytes: Na 133, K 3.3, coCa 9.4, all others WNL Renal: SCr 0.62, BUN 6 Hepatic: AST/ALT/Alk Phos/T bili WNL, albumin 3.4, TG 49 Intake / Output; MIVF: Urine output: 0.4 mL/kg/hr, emesis/NG output: 6037m LBM: 2/23, MIVF: 8514mr GI Imaging: None  GI Surgeries / Procedures:  None  Central access: PICC 2/22 TPN start date: 03/09/21  Nutritional Goals: Goal TPN rate is 80 mL/hr (provides 80.6 g of protein and 1910 kcals per day)  RD Assessment: Estimated Needs Total Energy Estimated Needs: 1800-2000 Total Protein Estimated Needs: 75-85 g Total Fluid Estimated Needs: >2 L  Current Nutrition:  Regular diet and TPN  Plan:  Advance TPN to goal rate of 27m72m at 1800. TPN to provide 80.6g protein and 1910 kcal meeting ~100% of nutritional needs. Electrolytes in TPN: Na 75mE15m K 55mEq54mCa 3mEq/L82mg 5mEq/L,39md Phos 14mmol/L54m:Ac 1:1 Add standard MVI and trace elements to TPN Add thiamine '100mg'$  and folic acid '1mg'$  to TPN due to pregnancy and high risk of refeeding  syndrome Add famotidine '40mg'$  to TPN  Give KCl 10mEq x6 77m outside of TPN this AM Continue Sensitive q6h SSI and adjust as needed  Discontinue MIVF at 1800 Monitor TPN labs on Mon/Thurs, and daily until stabilized on TPN Monitor response to antiemetic therapies and ability to tolerate PO food  Priyana Mccarey JardLuisa HartBCPS Clinical Pharmacist 03/10/2022 7:45 AM   Please refer to [facility]ION for [facility] phone number

## 2022-03-11 ENCOUNTER — Other Ambulatory Visit (HOSPITAL_COMMUNITY): Payer: Self-pay

## 2022-03-11 DIAGNOSIS — Z3A01 Less than 8 weeks gestation of pregnancy: Secondary | ICD-10-CM

## 2022-03-11 DIAGNOSIS — O210 Mild hyperemesis gravidarum: Secondary | ICD-10-CM

## 2022-03-11 LAB — PHOSPHORUS: Phosphorus: 3.8 mg/dL (ref 2.5–4.6)

## 2022-03-11 LAB — COMPREHENSIVE METABOLIC PANEL
ALT: 18 U/L (ref 0–44)
AST: 19 U/L (ref 15–41)
Albumin: 3.5 g/dL (ref 3.5–5.0)
Alkaline Phosphatase: 63 U/L (ref 38–126)
Anion gap: 9 (ref 5–15)
BUN: 7 mg/dL (ref 6–20)
CO2: 25 mmol/L (ref 22–32)
Calcium: 9.1 mg/dL (ref 8.9–10.3)
Chloride: 98 mmol/L (ref 98–111)
Creatinine, Ser: 0.75 mg/dL (ref 0.44–1.00)
GFR, Estimated: >60 mL/min (ref >60)
Glucose, Bld: 144 mg/dL — ABNORMAL HIGH (ref 70–99)
Potassium: 3.9 mmol/L (ref 3.5–5.1)
Sodium: 132 mmol/L — ABNORMAL LOW (ref 135–145)
Total Bilirubin: 0.7 mg/dL (ref 0.3–1.2)
Total Protein: 7.1 g/dL (ref 6.5–8.1)

## 2022-03-11 LAB — TRIGLYCERIDES: Triglycerides: 56 mg/dL (ref <150)

## 2022-03-11 LAB — GLUCOSE, CAPILLARY
Glucose-Capillary: 146 mg/dL — ABNORMAL HIGH (ref 70–99)
Glucose-Capillary: 195 mg/dL — ABNORMAL HIGH (ref 70–99)

## 2022-03-11 LAB — MAGNESIUM: Magnesium: 2 mg/dL (ref 1.7–2.4)

## 2022-03-11 MED ORDER — METHYLPREDNISOLONE 4 MG PO TABS
ORAL_TABLET | ORAL | 0 refills | Status: DC
  Filled 2022-03-11: qty 19, 5d supply, fill #0

## 2022-03-11 MED ORDER — ONDANSETRON 8 MG PO TBDP
8.0000 mg | ORAL_TABLET | Freq: Three times a day (TID) | ORAL | 0 refills | Status: DC | PRN
  Filled 2022-03-11: qty 20, 7d supply, fill #0

## 2022-03-11 MED ORDER — TRAVASOL 10 % IV SOLN
INTRAVENOUS | Status: DC
  Filled 2022-03-11: qty 806.4

## 2022-03-11 MED ORDER — HYDROMORPHONE HCL 1 MG/ML IJ SOLN
1.0000 mg | Freq: Once | INTRAMUSCULAR | Status: AC
  Administered 2022-03-11: 1 mg via INTRAVENOUS
  Filled 2022-03-11: qty 1

## 2022-03-11 MED ORDER — ONDANSETRON 4 MG PO TBDP
8.0000 mg | ORAL_TABLET | Freq: Three times a day (TID) | ORAL | Status: DC | PRN
  Administered 2022-03-11: 8 mg via ORAL
  Filled 2022-03-11: qty 2

## 2022-03-11 NOTE — Plan of Care (Signed)
  Problem: Education: Goal: Knowledge of General Education information will improve Description: Including pain rating scale, medication(s)/side effects and non-pharmacologic comfort measures Outcome: Progressing   Problem: Health Behavior/Discharge Planning: Goal: Ability to manage health-related needs will improve Outcome: Progressing   Problem: Clinical Measurements: Goal: Ability to maintain clinical measurements within normal limits will improve Outcome: Progressing Goal: Will remain free from infection Outcome: Progressing Goal: Diagnostic test results will improve Outcome: Progressing Goal: Respiratory complications will improve Outcome: Progressing Goal: Cardiovascular complication will be avoided Outcome: Progressing   Problem: Activity: Goal: Risk for activity intolerance will decrease Outcome: Progressing   Problem: Coping: Goal: Level of anxiety will decrease Outcome: Progressing   Problem: Elimination: Goal: Will not experience complications related to bowel motility Outcome: Progressing Goal: Will not experience complications related to urinary retention Outcome: Progressing   Problem: Pain Managment: Goal: General experience of comfort will improve Outcome: Progressing   Problem: Safety: Goal: Ability to remain free from injury will improve Outcome: Progressing   Problem: Skin Integrity: Goal: Risk for impaired skin integrity will decrease Outcome: Progressing   Problem: Education: Goal: Knowledge of disease or condition will improve Outcome: Progressing Goal: Knowledge of the prescribed therapeutic regimen will improve Outcome: Progressing Goal: Individualized Educational Video(s) Outcome: Progressing   Problem: Clinical Measurements: Goal: Complications related to the disease process, condition or treatment will be avoided or minimized Outcome: Progressing   Problem: Education: Goal: Knowledge of disease or condition will improve Outcome:  Progressing Goal: Knowledge of the prescribed therapeutic regimen will improve Outcome: Progressing   Problem: Bowel/Gastric: Goal: Occurences of nausea and/or vomiting will decrease Outcome: Progressing   Problem: Fluid Volume: Goal: Maintenance of adequate hydration will improve Outcome: Progressing   Problem: Nutritional: Goal: Achievement of adequate weight for body size and type will improve Outcome: Progressing

## 2022-03-11 NOTE — Inpatient Diabetes Management (Signed)
Inpatient Diabetes Program Recommendations  AACE/ADA: New Consensus Statement on Inpatient Glycemic Control (2015)  Target Ranges:  Prepandial:   less than 140 mg/dL      Peak postprandial:   less than 180 mg/dL (1-2 hours)      Critically ill patients:  140 - 180 mg/dL   Lab Results  Component Value Date   GLUCAP 146 (H) 03/11/2022    Review of Glycemic Control  Latest Reference Range & Units 03/10/22 06:30 03/10/22 12:09 03/10/22 18:14 03/11/22 00:11 03/11/22 06:01  Glucose-Capillary 70 - 99 mg/dL 146 (H) 123 (H) 169 (H) 195 (H) 146 (H)  (H): Data is abnormally high  Diabetes history: No DM history Outpatient Diabetes medications: None Current orders for Inpatient glycemic control: Novolog 0-6 units Q6H, Medrol taper  Inpatient Diabetes Program Recommendations:    Novolog 0-15 units Q6H while on steroids.  Will continue to follow while inpatient.  Thank you, Reche Dixon, MSN, [place] Diabetes Coordinator Inpatient Diabetes Program [phone] (team pager from 8a-5p)

## 2022-03-11 NOTE — Progress Notes (Addendum)
PHARMACY - TOTAL PARENTERAL NUTRITION CONSULT NOTE  Indication:  Hyperemesis gravidarum  Patient Measurements: Height: '5\' 3"'$  (160 cm) Weight: 60.5 kg (133 lb 6 oz) IBW/kg (Calculated) : 52.4 TPN AdjBW (KG): 56.3 Body mass index is 23.63 kg/m. Usual Weight: 68 kg  Assessment:  32 yo F admitted for hyperemesis gravidarum. Pt is 47w0dinto pregnancy with estimated delivery date of 10/26/22. Pt has been unable to tolerate any food - she vomits immediately after eating. She was previously admitted 2/16 for hyperemesis gravidarum and discharged home on 2/21. Pt has had poor nutrition over several days. PICC line was placed on 2/22 due to her severe dehydration and inability to place peripheral IV for IV hydration and medications. Pharmacy consulted to dose TPN.  Glucose / Insulin: no hx DM - CBGs mostly controlled. On steroids through 3/6 for nausea Required 6 units insulin in past 24 hrs. Electrolytes: all WNL except low Na (K up to 3.9 post 6 runs) Renal: SCr < 1, BUN WNL Hepatic: LFTs / tbili / TG WNL, albumin WNL at 3.5 Intake / Output; MIVF: UOP 0.7 mL/kg/hr, emesis/NG 2034m LBM 2/23, LR at 45 ml/hr GI Imaging: none  GI Surgeries / Procedures: none  Central access: PICC 03/07/22 TPN start date: 03/09/21  Nutritional Goals: RD Estimated Needs Total Energy Estimated Needs: 1800-2000 Total Protein Estimated Needs: 75-85 g Total Fluid Estimated Needs: >2 L  Current Nutrition:  Regular diet and TPN Boost TIDWM - 2 charted given yesterday  Plan:  Continue TPN at goal rate of 8067mr to provide 81g AA and 1910 kCal, meeting ~100% of nutritional needs. Electrolytes in TPN: increase Na to 120m15m, K 55mE52m(= 105 mEq/day), Ca 3mEq/72mMg 5mEq/L75meduce Phos to 11mmol/71ml:Ac 1:1 for now Add standard MVI and trace elements to TPN Add thiamine '100mg'$  and folic acid '1mg'$  to TPN due to pregnancy and high risk of refeeding syndrome Add famotidine '40mg'$  to TPN Continue sensitive SSI Q6H LR  45 ml/hr per MD Monitor TPN labs on Mon/Thurs - labs in AM Monitor Na and might need to reduce free water from TPN Monitor response to antiemetic therapies and ability to tolerate PO food  Renell Coaxum D. Adamaris King, PhMina Marble, BCPS, BCCCP 2/[place]24, 7:15 AM

## 2022-03-11 NOTE — Progress Notes (Signed)
[facility] ANTEPARTUM PROGRESS NOTE  Krista Stout is a 32 y.o. G2P0 at 25w2dwho is admitted for hyperemesis gravidarum.  Estimated Date of Delivery: 10/26/22  Length of Stay:  5 Days. Admitted 03/06/2022  Subjective: The patient continues to have significant nausea and vomiting.  [place]  Due to time constraints and lack of transportation, the patient cannot make that appointment.  She does have a surgical appointment on 03/12/22 for surgical management of the pregnancy.  She wishes to remain on site and be discharged from the hospital on 2/27 and go directly to that appointment.  Vitals:  Blood pressure 115/83, pulse 78, temperature 98.3 F (36.8 C), temperature source Oral, resp. rate 16, height '5\' 3"'$  (1.6 m), weight 60.5 kg, last menstrual period 01/28/2022, SpO2 100 %. Physical Examination: CONSTITUTIONAL: Well-developed, well-nourished female in no acute distress. Pt appears tired and mildly ill appearing HENT:  Normocephalic, atraumatic, External right and left ear normal. Oropharynx is clear and moist EYES: Conjunctivae and EOM are normal. NECK: Normal range of motion, supple, no masses. SKIN: Skin is warm and dry. No rash noted. Not diaphoretic. No erythema. No pallor. NTaos Alert and oriented to person, place, and time. Normal reflexes, muscle tone coordination. No cranial nerve deficit noted. PSYCHIATRIC: Normal mood and affect. Normal behavior. Normal judgment and thought content. CARDIOVASCULAR: Normal heart rate noted, regular rhythm RESPIRATORY: Effort and breath sounds normal, no problems with respiration noted MUSCULOSKELETAL: Normal range of motion. No edema and no tenderness. ABDOMEN: Soft, nontender, nondistended. CERVIX: deferred   Results for orders placed or performed during the hospital encounter of 03/06/22 (from the past 48 hour(s))  Comprehensive metabolic panel     Status: Abnormal    Collection Time: 03/09/22 10:05 AM  Result Value Ref Range   Sodium 135 135 - 145 mmol/L   Potassium 5.0 3.5 - 5.1 mmol/L   Chloride 105 98 - 111 mmol/L   CO2 22 22 - 32 mmol/L   Glucose, Bld 102 (H) 70 - 99 mg/dL    Comment: Glucose reference range applies only to samples taken after fasting for at least 8 hours.   BUN 5 (L) 6 - 20 mg/dL   Creatinine, Ser 0.60 0.44 - 1.00 mg/dL   Calcium 7.7 (L) 8.9 - 10.3 mg/dL   Total Protein 5.8 (L) 6.5 - 8.1 g/dL   Albumin 2.9 (L) 3.5 - 5.0 g/dL   AST 10 (L) 15 - 41 U/L   ALT 11 0 - 44 U/L   Alkaline Phosphatase 53 38 - 126 U/L   Total Bilirubin 0.7 0.3 - 1.2 mg/dL   GFR, Estimated >60 >60 mL/min    Comment: (NOTE) Calculated using the CKD-EPI Creatinine Equation (2021)    Anion gap 8 5 - 15    Comment: Performed at [facility] [address] [id number] Glucose, capillary     Status: Abnormal   Collection Time: 03/09/22  6:10 PM  Result Value Ref Range   Glucose-Capillary 169 (H) 70 - 99 mg/dL    Comment: Glucose reference range applies only to samples taken after fasting for at least 8 hours.  Glucose, capillary     Status: Abnormal   Collection Time: 03/10/22 12:32 AM  Result Value Ref Range   Glucose-Capillary 164 (H) 70 - 99 mg/dL    Comment: Glucose reference range applies only to samples taken after fasting for at least 8  hours.  Comprehensive metabolic panel     Status: Abnormal   Collection Time: 03/10/22  5:04 AM  Result Value Ref Range   Sodium 133 (L) 135 - 145 mmol/L   Potassium 3.3 (L) 3.5 - 5.1 mmol/L   Chloride 98 98 - 111 mmol/L   CO2 25 22 - 32 mmol/L   Glucose, Bld 127 (H) 70 - 99 mg/dL    Comment: Glucose reference range applies only to samples taken after fasting for at least 8 hours.   BUN 6 6 - 20 mg/dL   Creatinine, Ser 0.62 0.44 - 1.00 mg/dL   Calcium 8.9 8.9 - 10.3 mg/dL   Total Protein 6.6 6.5 - 8.1 g/dL   Albumin 3.4 (L) 3.5 - 5.0 g/dL   AST 17 15 - 41 U/L   ALT 14 0 - 44 U/L    Alkaline Phosphatase 63 38 - 126 U/L   Total Bilirubin 0.4 0.3 - 1.2 mg/dL   GFR, Estimated >60 >60 mL/min    Comment: (NOTE) Calculated using the CKD-EPI Creatinine Equation (2021)    Anion gap 10 5 - 15    Comment: Performed at [facility] [address] [place], [place] [address]  Magnesium     Status: None   Collection Time: 03/10/22  5:04 AM  Result Value Ref Range   Magnesium 2.0 1.7 - 2.4 mg/dL    Comment: Performed at [facility] [address], [place], [place] [address]  Phosphorus     Status: None   Collection Time: 03/10/22  5:04 AM  Result Value Ref Range   Phosphorus 3.4 2.5 - 4.6 mg/dL    Comment: Performed at [facility] [address] [place], [place] [address]  Triglycerides     Status: None   Collection Time: 03/10/22  5:04 AM  Result Value Ref Range   Triglycerides 49 <150 mg/dL    Comment: Performed at [facility] [address] [place], [place] [address]  Glucose, capillary     Status: Abnormal   Collection Time: 03/10/22  6:30 AM  Result Value Ref Range   Glucose-Capillary 146 (H) 70 - 99 mg/dL    Comment: Glucose reference range applies only to samples taken after fasting for at least 8 hours.  Glucose, capillary     Status: Abnormal   Collection Time: 03/10/22 12:09 PM  Result Value Ref Range   Glucose-Capillary 123 (H) 70 - 99 mg/dL    Comment: Glucose reference range applies only to samples taken after fasting for at least 8 hours.  Glucose, capillary     Status: Abnormal   Collection Time: 03/10/22  6:14 PM  Result Value Ref Range   Glucose-Capillary 169 (H) 70 - 99 mg/dL    Comment: Glucose reference range applies only to samples taken after fasting for at least 8 hours.  Glucose, capillary     Status: Abnormal   Collection Time: 03/11/22 12:11 AM  Result Value Ref Range   Glucose-Capillary 195 (H) 70 - 99 mg/dL    Comment: Glucose reference range applies only to samples taken after fasting for at least 8 hours.   Comprehensive metabolic panel     Status: Abnormal   Collection Time: 03/11/22  3:33 AM  Result Value Ref Range   Sodium 132 (L) 135 - 145 mmol/L   Potassium 3.9 3.5 - 5.1 mmol/L   Chloride 98 98 - 111 mmol/L   CO2 25 22 - 32  mmol/L   Glucose, Bld 144 (H) 70 - 99 mg/dL    Comment: Glucose reference range applies only to samples taken after fasting for at least 8 hours.   BUN 7 6 - 20 mg/dL   Creatinine, Ser 0.75 0.44 - 1.00 mg/dL   Calcium 9.1 8.9 - 10.3 mg/dL   Total Protein 7.1 6.5 - 8.1 g/dL   Albumin 3.5 3.5 - 5.0 g/dL   AST 19 15 - 41 U/L   ALT 18 0 - 44 U/L   Alkaline Phosphatase 63 38 - 126 U/L   Total Bilirubin 0.7 0.3 - 1.2 mg/dL   GFR, Estimated >60 >60 mL/min    Comment: (NOTE) Calculated using the CKD-EPI Creatinine Equation (2021)    Anion gap 9 5 - 15    Comment: Performed at [facility] [address], [place], [place] [address]  Magnesium     Status: None   Collection Time: 03/11/22  3:33 AM  Result Value Ref Range   Magnesium 2.0 1.7 - 2.4 mg/dL    Comment: Performed at [facility] [address] [place], [place] [address]  Phosphorus     Status: None   Collection Time: 03/11/22  3:33 AM  Result Value Ref Range   Phosphorus 3.8 2.5 - 4.6 mg/dL    Comment: Performed at [facility] [address] [place], [place] [address]  Triglycerides     Status: None   Collection Time: 03/11/22  3:33 AM  Result Value Ref Range   Triglycerides 56 <150 mg/dL    Comment: Performed at [facility] [address] [place], [place] [address]  Glucose, capillary     Status: Abnormal   Collection Time: 03/11/22  6:01 AM  Result Value Ref Range   Glucose-Capillary 146 (H) 70 - 99 mg/dL    Comment: Glucose reference range applies only to samples taken after fasting for at least 8 hours.    I have reviewed the patient's current medications.  ASSESSMENT: Principal Problem:   Hyperemesis gravidarum Active Problems:   [redacted] weeks gestation of  pregnancy   On total parenteral nutrition (TPN)   PLAN: Continue inpatient TPN per nutrition. Plan for early discharge in AM on 2/27 Meds to beds completed and spoke with pharmacy regarding continuing medrol and completing meds to beds.   Advise follow up in 2-3 weeks post procedure.      Lynnda Shields, MD [place] Attending, [facility] 03/11/2022 9:43 AM

## 2022-03-11 NOTE — Progress Notes (Addendum)
Nutrition Follow-up  DOCUMENTATION CODES:  Not applicable  INTERVENTION:  Parenteral support, meeting est needs 100 mg thiamin added to parenteral support  Boost Breeze discontinued as Pt continues to have significant n/v  NUTRITION DIAGNOSIS:   Inadequate oral intake related to   as evidenced by energy intake < or equal to 50% for > or equal to 5 days. GOAL:  Patient will meet greater than or equal to 90% of their needs  MONITOR:   Labs, PO intake  REASON FOR ASSESSMENT:   Antenatal, Consult Assessment of nutrition requirement/status  ASSESSMENT:   7 2/7 weeks IUP, significant hyperemesis. 8 kg weight loss 2/21-2/26. Second day of 100 % parenteral support. Continues on Medrol taper. Pt to be discharged for surgery on 2/27 Parenteral support providing 1910 Kcal, 80 g protein  Diet Order:   Diet Order             Diet regular Room service appropriate? Yes; Fluid consistency: Thin  Diet effective now                   EDUCATION NEEDS:   No education needs have been identified at this time  Skin:  Skin Assessment: Reviewed RN Assessment  Last BM:   2/23  Height:   Ht Readings from Last 1 Encounters:  03/06/22 '5\' 3"'$  (1.6 m)    Weight:   Wt Readings from Last 1 Encounters:  03/11/22 60.5 kg    Ideal Body Weight:     BMI:  Body mass index is 23.63 kg/m.  Estimated Nutritional Needs:   Kcal:  1800-2000  Protein:  75-85 g  Fluid:  >2 L

## 2022-03-12 HISTORY — PX: INDUCED ABORTION: SHX677

## 2022-03-12 LAB — BASIC METABOLIC PANEL
Anion gap: 6 (ref 5–15)
BUN: 14 mg/dL (ref 6–20)
CO2: 29 mmol/L (ref 22–32)
Calcium: 8.2 mg/dL — ABNORMAL LOW (ref 8.9–10.3)
Chloride: 100 mmol/L (ref 98–111)
Creatinine, Ser: 0.63 mg/dL (ref 0.44–1.00)
GFR, Estimated: >60 mL/min (ref >60)
Glucose, Bld: 101 mg/dL — ABNORMAL HIGH (ref 70–99)
Potassium: 3.9 mmol/L (ref 3.5–5.1)
Sodium: 135 mmol/L (ref 135–145)

## 2022-03-12 LAB — PHOSPHORUS: Phosphorus: 4.3 mg/dL (ref 2.5–4.6)

## 2022-03-12 LAB — MAGNESIUM: Magnesium: 1.8 mg/dL (ref 1.7–2.4)

## 2022-03-12 MED ORDER — HYDROMORPHONE HCL 1 MG/ML IJ SOLN
1.0000 mg | Freq: Once | INTRAMUSCULAR | Status: AC
  Administered 2022-03-12: 1 mg via INTRAVENOUS
  Filled 2022-03-12: qty 1

## 2022-03-12 MED ORDER — SODIUM CHLORIDE 0.9 % IV SOLN
8.0000 mg | Freq: Three times a day (TID) | INTRAVENOUS | Status: DC | PRN
  Administered 2022-03-12: 8 mg via INTRAVENOUS
  Filled 2022-03-12: qty 4

## 2022-03-12 NOTE — Progress Notes (Signed)
PHARMACY - TOTAL PARENTERAL NUTRITION CONSULT NOTE  Indication:  Hyperemesis gravidarum  Patient Measurements: Height: '5\' 3"'$  (160 cm) Weight: 60.5 kg (133 lb 6 oz) IBW/kg (Calculated) : 52.4 TPN AdjBW (KG): 56.3 Body mass index is 23.63 kg/m. Usual Weight: 68 kg  Assessment:  32 yo F admitted for hyperemesis gravidarum. Pt is 28w0dinto pregnancy with estimated delivery date of 10/26/22. Pt has been unable to tolerate any food - she vomits immediately after eating. She was previously admitted 2/16 for hyperemesis gravidarum and discharged home on 2/21. Pt has had poor nutrition over several days. PICC line was placed on 2/22 due to her severe dehydration and inability to place peripheral IV for IV hydration and medications. Pharmacy consulted to dose TPN.  Glucose / Insulin: no hx DM - CBGs mostly controlled. On steroids through 3/6 for nausea SSI D/C'ed 2/26 by provider Electrolytes: all WNL (Na and CoCa low normal, Mag down to 1.8, Phos high normal) Renal: SCr < 1, BUN WNL Hepatic: LFTs / tbili / TG WNL, albumin WNL at 3.5 Intake / Output; MIVF: UOP 0.1 mL/kg/hr, emesis/NG 20110m LBM 2/23, LR at 45 ml/hr GI Imaging: none  GI Surgeries / Procedures: none  Central access: PICC 03/07/22 TPN start date: 03/09/21  Nutritional Goals: RD Estimated Needs Total Energy Estimated Needs: 1800-2000 Total Protein Estimated Needs: 75-85 g Total Fluid Estimated Needs: >2 L  Current Nutrition:  Regular diet and TPN  Plan:  Discharging and PICC already pulled D/C TPN labs and nursing care orders  Rylie Limburg D. DaMina MarblePharmD, BCPS, BCGeraldine/27/2024, 9:26 AM

## 2022-03-12 NOTE — Plan of Care (Signed)
Patient discharged with AVS instructions, meds reviewed and patient verbalizes understanding of medication regimen that is listed on her AVS. Patient has f/u with MD

## 2022-03-12 NOTE — Discharge Summary (Signed)
Physician Discharge Summary  Patient ID: Krista Stout MRN: [id number] DOB/AGE: 1990/06/08 32 y.o.  Admit date: 03/06/2022 Discharge date: 03/12/2022  Admission Diagnoses:  nausea and vomiting, hyperemesis gravidarum  Discharge Diagnoses:  Principal Problem:   Hyperemesis gravidarum Active Problems:   [redacted] weeks gestation of pregnancy   On total parenteral nutrition (TPN)   Discharged Condition: fair  Hospital Course: The patient was admitted for severe hyperemesis gravidarum.  She had regular emesis and abdominal cramping and was not keeping fluids down.  PICC line was placed and her potassium was replaced.  Pt still has poor enteral nutrition.  She plans to terminate the pregnancy in [place], [place] today.  She has been advised that it may take a few days for the effects of the hyperemesis to begin to recede.  Consults: None  Significant Diagnostic Studies: radiology: Ultrasound:    Treatments: IV hydration and PICC line placement with TPN and scheduled antiemetics  Discharge Exam: Blood pressure 92/63, pulse (!) 119, temperature 98.1 F (36.7 C), temperature source Oral, resp. rate 18, height '5\' 3"'$  (1.6 m), weight 60.5 kg, last menstrual period 01/28/2022, SpO2 99 %. General appearance: alert, cooperative, fatigued, and no distress Head: Normocephalic, without obvious abnormality, atraumatic Resp: clear to auscultation bilaterally Cardio: regular rate and rhythm Extremities: Homans sign is negative, no sign of DVT  Disposition: Discharge disposition: 01-Home or Self Care       Discharge Instructions     Discharge activity:  No Restrictions   Complete by: As directed    Discharge diet:   Complete by: As directed    Bland diet, BRAT diet   No sexual activity restrictions   Complete by: As directed    PICC line removal   Complete by: As directed       Allergies as of 03/12/2022   No Known Allergies      Medication List     TAKE these medications     methylPREDNISolone 4 MG tablet Commonly known as: Medrol Take as directed   metoCLOPramide 10 MG tablet Commonly known as: REGLAN Take 1 tablet (10 mg total) by mouth every 6 (six) hours.   ondansetron 8 MG disintegrating tablet Commonly known as: ZOFRAN-ODT Take 1 tablet (8 mg total) by mouth every 8 (eight) hours as needed for nausea or vomiting. What changed:  medication strength how much to take when to take this reasons to take this   promethazine 25 MG suppository Commonly known as: PHENERGAN Place 1 suppository (25 mg total) rectally every 6 (six) hours as needed for nausea or vomiting.   scopolamine 1 MG/3DAYS Commonly known as: TRANSDERM-SCOP Place 1 patch (1.5 mg total) onto the skin every 3 (three) days.        Follow-up [facility] Follow up in 3 week(s).   Specialty: Obstetrics and Gynecology Why: hospital follow up Contact information: [address] [place] [phone]                Signed: Griffin Basil 03/12/2022, 8:27 AM

## 2022-03-13 ENCOUNTER — Encounter (HOSPITAL_COMMUNITY): Payer: Self-pay | Admitting: *Deleted

## 2022-03-13 ENCOUNTER — Inpatient Hospital Stay (HOSPITAL_COMMUNITY)
Admission: AD | Admit: 2022-03-13 | Discharge: 2022-03-15 | DRG: 832 | Disposition: A | Discharge status: Home / Self Care | Payer: Medicaid - Out of State | Attending: Obstetrics and Gynecology | Admitting: Obstetrics and Gynecology

## 2022-03-13 ENCOUNTER — Other Ambulatory Visit: Payer: Self-pay

## 2022-03-13 DIAGNOSIS — O0389 Complete or unspecified spontaneous abortion with other complications: Secondary | ICD-10-CM | POA: Diagnosis present

## 2022-03-13 DIAGNOSIS — O210 Mild hyperemesis gravidarum: Principal | ICD-10-CM | POA: Diagnosis present

## 2022-03-13 DIAGNOSIS — Z3A01 Less than 8 weeks gestation of pregnancy: Secondary | ICD-10-CM | POA: Diagnosis not present

## 2022-03-13 DIAGNOSIS — O211 Hyperemesis gravidarum with metabolic disturbance: Principal | ICD-10-CM | POA: Diagnosis present

## 2022-03-13 DIAGNOSIS — Z87891 Personal history of nicotine dependence: Secondary | ICD-10-CM

## 2022-03-13 LAB — TYPE AND SCREEN
ABO/RH(D): O POS
Antibody Screen: NEGATIVE

## 2022-03-13 LAB — CBC
HCT: 41.6 % (ref 36.0–46.0)
Hemoglobin: 14.4 g/dL (ref 12.0–15.0)
MCH: 32.4 pg (ref 26.0–34.0)
MCHC: 34.6 g/dL (ref 30.0–36.0)
MCV: 93.7 fL (ref 80.0–100.0)
Platelets: 332 10*3/uL (ref 150–400)
RBC: 4.44 MIL/uL (ref 3.87–5.11)
RDW: 13.2 % (ref 11.5–15.5)
WBC: 13 10*3/uL — ABNORMAL HIGH (ref 4.0–10.5)
nRBC: 0 % (ref 0.0–0.2)

## 2022-03-13 LAB — GLUCOSE, CAPILLARY: Glucose-Capillary: 146 mg/dL — ABNORMAL HIGH (ref 70–99)

## 2022-03-13 MED ORDER — SODIUM CHLORIDE 0.9 % IV SOLN: 8.0000 mg | Freq: Three times a day (TID) | INTRAVENOUS | Status: DC | PRN

## 2022-03-13 MED ORDER — METOCLOPRAMIDE HCL 10 MG PO TABS: 10.0000 mg | ORAL_TABLET | Freq: Four times a day (QID) | ORAL | Status: DC

## 2022-03-13 MED ORDER — KCL IN DEXTROSE-NACL 20-5-0.2 MEQ/L-%-% IV SOLN
INTRAVENOUS | Status: DC
  Filled 2022-03-13 (×5): qty 1000

## 2022-03-13 MED ORDER — METHYLPREDNISOLONE SODIUM SUCC 125 MG IJ SOLR: 48.0000 mg | Freq: Once | INTRAMUSCULAR | Status: DC

## 2022-03-13 MED ORDER — CALCIUM CARBONATE ANTACID 500 MG PO CHEW: 2.0000 | CHEWABLE_TABLET | ORAL | Status: DC | PRN

## 2022-03-13 MED ORDER — METHYLPREDNISOLONE 4 MG PO TABS: 4.0000 mg | ORAL_TABLET | Freq: Every day | ORAL | Status: DC

## 2022-03-13 MED ORDER — ONDANSETRON HCL 4 MG/2ML IJ SOLN
4.0000 mg | Freq: Once | INTRAMUSCULAR | Status: AC
  Administered 2022-03-13: 4 mg via INTRAVENOUS
  Filled 2022-03-13: qty 2

## 2022-03-13 MED ORDER — KETOROLAC TROMETHAMINE 30 MG/ML IJ SOLN
30.0000 mg | Freq: Once | INTRAMUSCULAR | Status: AC
  Administered 2022-03-13: 30 mg via INTRAVENOUS
  Filled 2022-03-13: qty 1

## 2022-03-13 MED ORDER — METOCLOPRAMIDE HCL 5 MG/ML IJ SOLN
10.0000 mg | Freq: Once | INTRAMUSCULAR | Status: AC
  Administered 2022-03-13: 10 mg via INTRAVENOUS
  Filled 2022-03-13: qty 2

## 2022-03-13 MED ORDER — HYDROXYZINE HCL 50 MG/ML IM SOLN
50.0000 mg | Freq: Four times a day (QID) | INTRAMUSCULAR | Status: DC | PRN
  Administered 2022-03-13: 50 mg via INTRAMUSCULAR
  Filled 2022-03-13 (×2): qty 1

## 2022-03-13 MED ORDER — HYDROMORPHONE HCL 1 MG/ML IJ SOLN
1.0000 mg | INTRAMUSCULAR | Status: DC | PRN
  Administered 2022-03-13 – 2022-03-14 (×6): 1 mg via INTRAVENOUS
  Filled 2022-03-13 (×6): qty 1

## 2022-03-13 MED ORDER — ONDANSETRON HCL 4 MG/2ML IJ SOLN
4.0000 mg | Freq: Three times a day (TID) | INTRAMUSCULAR | Status: DC | PRN
  Administered 2022-03-13 – 2022-03-14 (×2): 4 mg via INTRAVENOUS
  Filled 2022-03-13 (×2): qty 2

## 2022-03-13 MED ORDER — ACETAMINOPHEN 325 MG PO TABS: 650.0000 mg | ORAL_TABLET | ORAL | Status: DC | PRN

## 2022-03-13 MED ORDER — METHYLPREDNISOLONE 4 MG PO TABS: 8.0000 mg | ORAL_TABLET | Freq: Every day | ORAL | Status: DC

## 2022-03-13 MED ORDER — SCOPOLAMINE 1 MG/3DAYS TD PT72
1.0000 | MEDICATED_PATCH | Freq: Once | TRANSDERMAL | Status: DC
  Administered 2022-03-13: 1.5 mg via TRANSDERMAL
  Filled 2022-03-13: qty 1

## 2022-03-13 MED ORDER — METHYLPREDNISOLONE SODIUM SUCC 125 MG IJ SOLR
60.0000 mg | Freq: Once | INTRAMUSCULAR | Status: AC
  Administered 2022-03-13: 60 mg via INTRAVENOUS
  Filled 2022-03-13: qty 2

## 2022-03-13 MED ORDER — DOCUSATE SODIUM 100 MG PO CAPS: 100.0000 mg | ORAL_CAPSULE | Freq: Every day | ORAL | Status: DC

## 2022-03-13 MED ORDER — METHYLPREDNISOLONE 16 MG PO TABS
16.0000 mg | ORAL_TABLET | Freq: Every day | ORAL | Status: DC
  Filled 2022-03-13: qty 1

## 2022-03-13 MED ORDER — LACTATED RINGERS IV SOLN
125.0000 mL/h | INTRAVENOUS | Status: AC
  Administered 2022-03-13: 125 mL/h via INTRAVENOUS

## 2022-03-13 MED ORDER — PRENATAL MULTIVITAMIN CH: 1.0000 | ORAL_TABLET | Freq: Every day | ORAL | Status: DC

## 2022-03-13 MED ORDER — HYDROMORPHONE HCL 1 MG/ML IJ SOLN
1.0000 mg | Freq: Once | INTRAMUSCULAR | Status: AC
  Administered 2022-03-13: 1 mg via INTRAVENOUS
  Filled 2022-03-13: qty 1

## 2022-03-13 MED ORDER — ONDANSETRON 4 MG PO TBDP: 4.0000 mg | ORAL_TABLET | Freq: Three times a day (TID) | ORAL | Status: DC | PRN

## 2022-03-13 MED ORDER — LACTATED RINGERS IV SOLN: Freq: Once | INTRAVENOUS | Status: AC

## 2022-03-13 MED ORDER — PROMETHAZINE HCL 25 MG RE SUPP
12.5000 mg | RECTAL | Status: DC | PRN
  Administered 2022-03-14 (×2): 25 mg via RECTAL
  Filled 2022-03-13 (×3): qty 1

## 2022-03-13 MED ORDER — HYDROXYZINE HCL 50 MG PO TABS: 50.0000 mg | ORAL_TABLET | Freq: Four times a day (QID) | ORAL | Status: DC | PRN

## 2022-03-13 MED ORDER — METHYLPREDNISOLONE 16 MG PO TABS: 16.0000 mg | ORAL_TABLET | Freq: Every day | ORAL | Status: DC

## 2022-03-13 MED ORDER — DIPHENHYDRAMINE HCL 50 MG/ML IJ SOLN
25.0000 mg | Freq: Four times a day (QID) | INTRAMUSCULAR | Status: DC | PRN
  Administered 2022-03-13 – 2022-03-15 (×8): 25 mg via INTRAVENOUS
  Filled 2022-03-13 (×9): qty 1

## 2022-03-13 MED ORDER — PROMETHAZINE HCL 25 MG PO TABS: 12.5000 mg | ORAL_TABLET | ORAL | Status: DC | PRN

## 2022-03-13 MED ORDER — METOCLOPRAMIDE HCL 5 MG/ML IJ SOLN
10.0000 mg | Freq: Four times a day (QID) | INTRAMUSCULAR | Status: DC
  Administered 2022-03-13 – 2022-03-15 (×8): 10 mg via INTRAVENOUS
  Filled 2022-03-13 (×8): qty 2

## 2022-03-13 MED ORDER — DIPHENHYDRAMINE HCL 50 MG/ML IJ SOLN
25.0000 mg | Freq: Once | INTRAMUSCULAR | Status: AC
  Administered 2022-03-13: 25 mg via INTRAVENOUS
  Filled 2022-03-13: qty 1

## 2022-03-13 NOTE — MAU Note (Addendum)
..  Krista Stout is a 32 y.o. at 39w4dhere in MAU reporting: [place]. When she got home, felt light headed, N/V, and has had vaginal bleeding (2 pads per hour.), pain "all over" that feels sharp and stabbing.   Pain score: 10/10 Vitals:   03/13/22 0342  BP: (!) 121/99  Pulse: (!) 124  Resp: 20  Temp: 98.2 F (36.8 C)  SpO2: 100%     FHT:not indicated Lab orders placed from triage:  UA (pt reports she is not able to void in a cup right now)

## 2022-03-13 NOTE — MAU Provider Note (Signed)
Chief Complaint: Vaginal Bleeding, Emesis, Nausea, and Abdominal Pain   Event Date/Time   First Provider Initiated Contact with Patient 03/13/22 0356        SUBJECTIVE HPI: Krista Stout is a 32 y.o. G2P0 at 27w4dby LMP who presents to maternity admissions reporting bleeding, sharp pain, nausea and light headedness since returning home from a surgical abortion today.  She had just been discharged from the hospital yesterday after treatment for hyperemesis   States hyperemesis has continued and has not been able to keep down any meds today.  States has been taking Zofran "as prescribed", possible last dose at 10pm.. She denies h/a or fever/chills.    Vaginal Bleeding The patient's primary symptoms include pelvic pain and vaginal bleeding. This is a new problem. The current episode started today. The problem occurs constantly. She is not pregnant. Associated symptoms include abdominal pain and vomiting. Pertinent negatives include no chills, fever or frequency. The vaginal discharge was bloody. The vaginal bleeding is heavier than menses. She has not been passing clots. She has not been passing tissue. She has tried nothing for the symptoms.  Emesis  This is a recurrent problem. There has been no fever. Associated symptoms include abdominal pain. Pertinent negatives include no chills, fever or myalgias. She has tried nothing for the symptoms.  Abdominal Pain This is a new problem. The current episode started today. The problem has been unchanged. Associated symptoms include vomiting. Pertinent negatives include no fever, frequency or myalgias. The pain is relieved by Nothing. She has tried nothing (states unable to keep ibuprofen down) for the symptoms.   RN note: Krista METTERTis a 32y.o. at 753w4dere in MAU reporting: [place]. When she got home, felt light headed, N/V, and has had vaginal bleeding (2 pads per hour.), pain "all over" that feels  sharp and stabbing.    Pain score: 10/10  Past Medical History:  Diagnosis Date   Asthma    Cyclical vomiting syndrome    Past Surgical History:  Procedure Laterality Date   HERNIA REPAIR     RIGHT OOPHORECTOMY     Social History   Socioeconomic History   Marital status: Single    Spouse name: Not on file   Number of children: Not on file   Years of education: Not on file   Highest education level: Not on file  Occupational History   Not on file  Tobacco Use   Smoking status: Former   Smokeless tobacco: Never  Vaping Use   Vaping Use: Never used  Substance and Sexual Activity   Alcohol use: No   Drug use: No    Types: Marijuana    Comment: former   Sexual activity: Yes    Birth control/protection: None  Other Topics Concern   Not on file  Social History Narrative   Not on file   Social Determinants of Health   Financial Resource Strain: Not on file  Food Insecurity: Food Insecurity Present (03/06/2022)   Hunger Vital Sign    Worried About Running Out of Food in the Last Year: Never true    Ran Out of Food in the Last Year: Sometimes true  Transportation Needs: Unmet Transportation Needs (03/06/2022)   PRAPARE - TrHydrologistMedical): No    Lack of Transportation (Non-Medical): Yes  Physical Activity: Not on file  Stress: Not on file  Social Connections: Not on file  Intimate Partner Violence: Not At Risk (03/06/2022)   Humiliation, Afraid, Rape, and Kick questionnaire    Fear of Current or Ex-Partner: No    Emotionally Abused: No    Physically Abused: No    Sexually Abused: No   No current facility-administered medications on file prior to encounter.   Current Outpatient Medications on File Prior to Encounter  Medication Sig Dispense Refill   methylPREDNISolone (MEDROL) 4 MG tablet Take as directed 19 tablet 0   metoCLOPramide (REGLAN) 10 MG tablet Take 1 tablet (10 mg total) by mouth every 6 (six) hours. 30 tablet 0    ondansetron (ZOFRAN-ODT) 8 MG disintegrating tablet Take 1 tablet (8 mg total) by mouth every 8 (eight) hours as needed for nausea or vomiting. 20 tablet 0   promethazine (PHENERGAN) 25 MG suppository Place 1 suppository (25 mg total) rectally every 6 (six) hours as needed for nausea or vomiting. 12 each 0   scopolamine (TRANSDERM-SCOP) 1 MG/3DAYS Place 1 patch (1.5 mg total) onto the skin every 3 (three) days. 10 patch 12   No Known Allergies  I have reviewed patient's Past Medical Hx, Surgical Hx, Family Hx, Social Hx, medications and allergies.   ROS:  Review of Systems  Constitutional:  Negative for chills and fever.  Gastrointestinal:  Positive for abdominal pain and vomiting.  Genitourinary:  Positive for pelvic pain and vaginal bleeding. Negative for frequency.  Musculoskeletal:  Negative for myalgias.   Review of Systems  Other systems negative   Physical Exam  Physical Exam Patient Vitals for the past 24 hrs:  BP Temp Temp src Pulse Resp SpO2  03/13/22 0342 (!) 121/99 98.2 F (36.8 C) Oral (!) 124 20 100 %   Constitutional: Well-developed, well-nourished female in no acute distress.  Cardiovascular: normal rate Respiratory: normal effort GI: Abd soft, non-tender.  MS: Extremities nontender, no edema, normal ROM Neurologic: Alert and oriented x 4.  GU: Neg CVAT.  PELVIC EXAM: deferred  LAB RESULTS Results for orders placed or performed during the hospital encounter of 03/13/22 (from the past 24 hour(s))  CBC     Status: Abnormal   Collection Time: 03/13/22  4:34 AM  Result Value Ref Range   WBC 13.0 (H) 4.0 - 10.5 K/uL   RBC 4.44 3.87 - 5.11 MIL/uL   Hemoglobin 14.4 12.0 - 15.0 g/dL   HCT 41.6 36.0 - 46.0 %   MCV 93.7 80.0 - 100.0 fL   MCH 32.4 26.0 - 34.0 pg   MCHC 34.6 30.0 - 36.0 g/dL   RDW 13.2 11.5 - 15.5 %   Platelets 332 150 - 400 K/uL   nRBC 0.0 0.0 - 0.2 %     --/--/O POS (02/22 0151)  IMAGING   MAU Management/MDM: I have reviewed the  triage vital signs and the nursing notes.   Pertinent labs & imaging results that were available during my care of the patient were reviewed by me and considered in my medical decision making (see chart for details).      I have reviewed her medical records including past results, notes and treatments. Medical, Surgical, and family history were reviewed.  Medications and recent lab tests were reviewed  Treatments in MAU included   Ordered CBC and IV fluids (per IV team).   Zofran, Solumedrol given for nausea Toradol ordered for pain.  0530am:   These did not help with pain or nausea WIll add Benadryl and Reglan for nausea and Dilaudid for pain  States is passing  clots and bleeding heavily but there is only a small spot of blood on pad.   0630:  Did not tolerate PO fluids            Pain is much better, down to a "3"  Consult Dr Nelda Marseille with presentation, exam findings, and results.   Will admit for hyperemesis care   ASSESSMENT Status post surgical elective abortion Pelvic cramping/pain Vaginal bleeding Hyperemesis Dehydration  PLAN Admit to OBSCU Routine orders Further orders per Dr Nelda Marseille  MD to follow  Hansel Feinstein CNM, MSN Certified Nurse-Midwife 03/13/2022  3:56 AM

## 2022-03-13 NOTE — H&P (Signed)
Chief Complaint: Vaginal Bleeding, Emesis, Nausea, and Abdominal Pain   Event Date/Time   First Provider Initiated Contact with Patient 03/13/22 0356        SUBJECTIVE HPI: Krista Stout is a 32 y.o. G2P0 at 67w4dby LMP who presents to maternity admissions reporting bleeding, sharp pain, nausea and light headedness since returning home from a surgical abortion today.  She had just been discharged from the hospital yesterday after treatment for hyperemesis   States hyperemesis has continued and has not been able to keep down any meds today.  States has been taking Zofran "as prescribed", possible last dose at 10pm.. She denies h/a or fever/chills.    Vaginal Bleeding The patient's primary symptoms include pelvic pain and vaginal bleeding. This is a new problem. The current episode started today. The problem occurs constantly. She is not pregnant. Associated symptoms include abdominal pain and vomiting. Pertinent negatives include no chills, fever or frequency. The vaginal discharge was bloody. The vaginal bleeding is heavier than menses. She has not been passing clots. She has not been passing tissue. She has tried nothing for the symptoms.  Emesis  This is a recurrent problem. There has been no fever. Associated symptoms include abdominal pain. Pertinent negatives include no chills, fever or myalgias. She has tried nothing for the symptoms.  Abdominal Pain This is a new problem. The current episode started today. The problem has been unchanged. Associated symptoms include vomiting. Pertinent negatives include no fever, frequency or myalgias. The pain is relieved by Nothing. She has tried nothing (states unable to keep ibuprofen down) for the symptoms.   RN note: Krista PEGLOWis a 32y.o. at 768w4dere in MAU reporting: [place]. When she got home, felt light headed, N/V, and has had vaginal bleeding (2 pads per hour.), pain "all over" that feels  sharp and stabbing.    Pain score: 10/10  Past Medical History:  Diagnosis Date   Asthma    Cyclical vomiting syndrome    Past Surgical History:  Procedure Laterality Date   HERNIA REPAIR     RIGHT OOPHORECTOMY     Social History   Socioeconomic History   Marital status: Single    Spouse name: Not on file   Number of children: Not on file   Years of education: Not on file   Highest education level: Not on file  Occupational History   Not on file  Tobacco Use   Smoking status: Former   Smokeless tobacco: Never  Vaping Use   Vaping Use: Never used  Substance and Sexual Activity   Alcohol use: No   Drug use: No    Types: Marijuana    Comment: former   Sexual activity: Yes    Birth control/protection: None  Other Topics Concern   Not on file  Social History Narrative   Not on file   Social Determinants of Health   Financial Resource Strain: Not on file  Food Insecurity: Food Insecurity Present (03/06/2022)   Hunger Vital Sign    Worried About Running Out of Food in the Last Year: Never true    Ran Out of Food in the Last Year: Sometimes true  Transportation Needs: Unmet Transportation Needs (03/06/2022)   PRAPARE - TrHydrologistMedical): No    Lack of Transportation (Non-Medical): Yes  Physical Activity: Not on file  Stress: Not on file  Social Connections: Not on file  Intimate Partner Violence: Not At Risk (03/06/2022)   Humiliation, Afraid, Rape, and Kick questionnaire    Fear of Current or Ex-Partner: No    Emotionally Abused: No    Physically Abused: No    Sexually Abused: No   No current facility-administered medications on file prior to encounter.   Current Outpatient Medications on File Prior to Encounter  Medication Sig Dispense Refill   methylPREDNISolone (MEDROL) 4 MG tablet Take as directed 19 tablet 0   metoCLOPramide (REGLAN) 10 MG tablet Take 1 tablet (10 mg total) by mouth every 6 (six) hours. 30 tablet 0    ondansetron (ZOFRAN-ODT) 8 MG disintegrating tablet Take 1 tablet (8 mg total) by mouth every 8 (eight) hours as needed for nausea or vomiting. 20 tablet 0   promethazine (PHENERGAN) 25 MG suppository Place 1 suppository (25 mg total) rectally every 6 (six) hours as needed for nausea or vomiting. 12 each 0   scopolamine (TRANSDERM-SCOP) 1 MG/3DAYS Place 1 patch (1.5 mg total) onto the skin every 3 (three) days. 10 patch 12   No Known Allergies  I have reviewed patient's Past Medical Hx, Surgical Hx, Family Hx, Social Hx, medications and allergies.   ROS:  Review of Systems  Constitutional:  Negative for chills and fever.  Gastrointestinal:  Positive for abdominal pain and vomiting.  Genitourinary:  Positive for pelvic pain and vaginal bleeding. Negative for frequency.  Musculoskeletal:  Negative for myalgias.   Review of Systems  Other systems negative   Physical Exam  Physical Exam Patient Vitals for the past 24 hrs:  BP Temp Temp src Pulse Resp SpO2  03/13/22 0342 (!) 121/99 98.2 F (36.8 C) Oral (!) 124 20 100 %   Constitutional: Well-developed, well-nourished female in no acute distress.  Cardiovascular: normal rate Respiratory: normal effort GI: Abd soft, non-tender.  MS: Extremities nontender, no edema, normal ROM Neurologic: Alert and oriented x 4.  GU: Neg CVAT.  PELVIC EXAM: deferred  LAB RESULTS Results for orders placed or performed during the hospital encounter of 03/13/22 (from the past 24 hour(s))  CBC     Status: Abnormal   Collection Time: 03/13/22  4:34 AM  Result Value Ref Range   WBC 13.0 (H) 4.0 - 10.5 K/uL   RBC 4.44 3.87 - 5.11 MIL/uL   Hemoglobin 14.4 12.0 - 15.0 g/dL   HCT 41.6 36.0 - 46.0 %   MCV 93.7 80.0 - 100.0 fL   MCH 32.4 26.0 - 34.0 pg   MCHC 34.6 30.0 - 36.0 g/dL   RDW 13.2 11.5 - 15.5 %   Platelets 332 150 - 400 K/uL   nRBC 0.0 0.0 - 0.2 %     --/--/O POS (02/22 0151)  IMAGING   MAU Management/MDM: I have reviewed the  triage vital signs and the nursing notes.   Pertinent labs & imaging results that were available during my care of the patient were reviewed by me and considered in my medical decision making (see chart for details).      I have reviewed her medical records including past results, notes and treatments. Medical, Surgical, and family history were reviewed.  Medications and recent lab tests were reviewed  Treatments in MAU included   Ordered CBC and IV fluids (per IV team).   Zofran, Solumedrol given for nausea Toradol ordered for pain.  0530am:   These did not help with pain or nausea WIll add Benadryl and Reglan for nausea and Dilaudid for pain  States is passing  clots and bleeding heavily but there is only a small spot of blood on pad.   0630:  Did not tolerate PO fluids            Pain is much better, down to a "3"  Consult Dr Nelda Marseille with presentation, exam findings, and results.   Will admit for hyperemesis care   ASSESSMENT Status post surgical elective abortion Pelvic cramping/pain Vaginal bleeding Hyperemesis Dehydration  PLAN Admit to OBSCU Routine orders Further orders per Dr Nelda Marseille  MD to follow  Hansel Feinstein CNM, MSN Certified Nurse-Midwife 03/13/2022  3:56 AM

## 2022-03-14 DIAGNOSIS — Z3A01 Less than 8 weeks gestation of pregnancy: Secondary | ICD-10-CM

## 2022-03-14 DIAGNOSIS — O210 Mild hyperemesis gravidarum: Secondary | ICD-10-CM

## 2022-03-14 LAB — BASIC METABOLIC PANEL
Anion gap: 7 (ref 5–15)
BUN: 8 mg/dL (ref 6–20)
CO2: 26 mmol/L (ref 22–32)
Calcium: 8.3 mg/dL — ABNORMAL LOW (ref 8.9–10.3)
Chloride: 101 mmol/L (ref 98–111)
Creatinine, Ser: 0.75 mg/dL (ref 0.44–1.00)
GFR, Estimated: >60 mL/min (ref >60)
Glucose, Bld: 109 mg/dL — ABNORMAL HIGH (ref 70–99)
Potassium: 3.8 mmol/L (ref 3.5–5.1)
Sodium: 134 mmol/L — ABNORMAL LOW (ref 135–145)

## 2022-03-14 LAB — GLUCOSE, CAPILLARY: Glucose-Capillary: 117 mg/dL — ABNORMAL HIGH (ref 70–99)

## 2022-03-14 MED ORDER — KETOROLAC TROMETHAMINE 15 MG/ML IJ SOLN
15.0000 mg | Freq: Four times a day (QID) | INTRAMUSCULAR | Status: DC | PRN
  Administered 2022-03-14: 15 mg via INTRAVENOUS
  Filled 2022-03-14 (×3): qty 1

## 2022-03-14 MED ORDER — KCL IN DEXTROSE-NACL 20-5-0.45 MEQ/L-%-% IV SOLN
INTRAVENOUS | Status: DC
  Filled 2022-03-14 (×3): qty 1000

## 2022-03-14 MED ORDER — HYDROMORPHONE HCL 1 MG/ML IJ SOLN
0.5000 mg | Freq: Four times a day (QID) | INTRAMUSCULAR | Status: DC | PRN
  Administered 2022-03-14: 0.5 mg via INTRAVENOUS
  Filled 2022-03-14 (×2): qty 0.5

## 2022-03-14 MED ORDER — KETOROLAC TROMETHAMINE 15 MG/ML IJ SOLN
15.0000 mg | Freq: Four times a day (QID) | INTRAMUSCULAR | Status: DC
  Administered 2022-03-14 – 2022-03-15 (×3): 15 mg via INTRAVENOUS
  Filled 2022-03-14 (×4): qty 1

## 2022-03-14 MED ORDER — ACETAMINOPHEN 10 MG/ML IV SOLN
1000.0000 mg | Freq: Four times a day (QID) | INTRAVENOUS | Status: AC
  Administered 2022-03-14 – 2022-03-15 (×4): 1000 mg via INTRAVENOUS
  Filled 2022-03-14 (×4): qty 100

## 2022-03-14 MED ORDER — METHYLPREDNISOLONE SODIUM SUCC 40 MG IJ SOLR
16.0000 mg | Freq: Once | INTRAMUSCULAR | Status: AC
  Administered 2022-03-14: 16 mg via INTRAVENOUS
  Filled 2022-03-14: qty 0.4

## 2022-03-14 MED ORDER — ENOXAPARIN SODIUM 40 MG/0.4ML IJ SOSY: 40.0000 mg | PREFILLED_SYRINGE | INTRAMUSCULAR | Status: DC

## 2022-03-14 MED ORDER — ONDANSETRON HCL 4 MG/2ML IJ SOLN
4.0000 mg | Freq: Four times a day (QID) | INTRAMUSCULAR | Status: DC
  Administered 2022-03-14 – 2022-03-15 (×3): 4 mg via INTRAVENOUS
  Filled 2022-03-14 (×3): qty 2

## 2022-03-14 NOTE — Progress Notes (Addendum)
[facility] COMPREHENSIVE PROGRESS NOTE  JHORDYN ZAHRT is a 32 y.o. who is POD2 s/p D&C for pregnancy termination who was readmitted for hyperemesis gravidarum   Length of Stay:  1 Days. Admitted 03/13/2022  Subjective: Feels like she is moving in the right direction. Still nauseated & vomiting frequently, unable to keep anything down. Has cramping related to her procedure. No HVB. Is feeling fatigued & weak.  Vitals:  Blood pressure (!) 93/46, pulse 80, temperature 98.5 F (36.9 C), temperature source Oral, resp. rate 16, weight 63.9 kg, last menstrual period 01/28/2022, SpO2 100 %, unknown if currently breastfeeding.  Physical Examination: CONSTITUTIONAL: No acute distress.  NEUROLOGIC: Alert and oriented to person, place, and time.  PSYCHIATRIC: Normal mood and affect. Normal behavior. Normal judgment and thought content. CARDIOVASCULAR: Normal heart rate noted RESPIRATORY: Effort normal, no problems with respiration noted ABDOMEN: Soft, nontender, nondistended  Results for orders placed or performed during the hospital encounter of 03/13/22 (from the past 48 hour(s))  CBC     Status: Abnormal   Collection Time: 03/13/22  4:34 AM  Result Value Ref Range   WBC 13.0 (H) 4.0 - 10.5 K/uL   RBC 4.44 3.87 - 5.11 MIL/uL   Hemoglobin 14.4 12.0 - 15.0 g/dL   HCT 41.6 36.0 - 46.0 %   MCV 93.7 80.0 - 100.0 fL   MCH 32.4 26.0 - 34.0 pg   MCHC 34.6 30.0 - 36.0 g/dL   RDW 13.2 11.5 - 15.5 %   Platelets 332 150 - 400 K/uL   nRBC 0.0 0.0 - 0.2 %    Comment: Performed at [facility], [place] [address], [place], [place] [address]  Type and screen [place]     Status: None   Collection Time: 03/13/22  6:42 AM  Result Value Ref Range   ABO/RH(D) O POS    Antibody Screen NEG    Sample Expiration      03/16/2022,2359 Performed at [facility], [place] [address] [place], [place] [address]   Glucose, capillary     Status: Abnormal   Collection  Time: 03/13/22  8:42 AM  Result Value Ref Range   Glucose-Capillary 146 (H) 70 - 99 mg/dL    Comment: Glucose reference range applies only to samples taken after fasting for at least 8 hours.  Glucose, capillary     Status: Abnormal   Collection Time: 03/14/22  4:35 AM  Result Value Ref Range   Glucose-Capillary 117 (H) 70 - 99 mg/dL    Comment: Glucose reference range applies only to samples taken after fasting for at least 8 hours.   Comment 1 Notify RN   Basic metabolic panel     Status: Abnormal   Collection Time: 03/14/22  5:17 AM  Result Value Ref Range   Sodium 134 (L) 135 - 145 mmol/L   Potassium 3.8 3.5 - 5.1 mmol/L   Chloride 101 98 - 111 mmol/L   CO2 26 22 - 32 mmol/L   Glucose, Bld 109 (H) 70 - 99 mg/dL    Comment: Glucose reference range applies only to samples taken after fasting for at least 8 hours.   BUN 8 6 - 20 mg/dL   Creatinine, Ser 0.75 0.44 - 1.00 mg/dL   Calcium 8.3 (L) 8.9 - 10.3 mg/dL   GFR, Estimated >60 >60 mL/min    Comment: (NOTE) Calculated using the CKD-EPI Creatinine Equation (2021)    Anion gap 7 5 - 15  Comment: Performed at [facility], [place] [address], [place], [place] [address]   No results found.  Current scheduled medications  docusate sodium  100 mg Oral Daily   ketorolac  15 mg Intravenous Q6H   metoCLOPramide  10 mg Oral Q6H   Or   metoCLOPramide (REGLAN) injection  10 mg Intravenous Q6H   prenatal multivitamin  1 tablet Oral Q1200   scopolamine  1 patch Transdermal Once   I have reviewed the patient's current medications.  ASSESSMENT: Principal Problem:   Hyperemesis gravidarum  PLAN: Continue scheduled reglan, zofran, & scop path w/ benadryl & phenergan prn Steroids not helping, discontinued Scheduled IV tylenol & toradol for pain D5 1/2NS w/ 20 mEq K Regular diet ordered, pt is self regulating PT consult ordered - pt likely deconditioned due to prolonged hospitalization Will discuss contraception prior to  discharge  VTE ppx: SCDs, lovenox  Gale Journey, MD [place], [facility] for [employer], [place]

## 2022-03-15 DIAGNOSIS — O210 Mild hyperemesis gravidarum: Secondary | ICD-10-CM

## 2022-03-15 DIAGNOSIS — Z3A01 Less than 8 weeks gestation of pregnancy: Secondary | ICD-10-CM

## 2022-03-15 NOTE — Progress Notes (Signed)
PT Cancellation Note  Patient Details Name: Krista Stout MRN: [id number] DOB: Mar 14, 1990   Cancelled Treatment:    Reason Eval/Treat Not Completed: PT screened, no needs identified, will sign off. Noted pt with orders for d/c. Per epic chat with RN, pt cleared for d/c by MD, ambulating without difficulty, and no PT needs.    Lorriane Shire 03/15/2022, 10:41 AM

## 2022-03-15 NOTE — Discharge Summary (Addendum)
Physician Discharge Summary  Patient ID: Krista Stout MRN: [id number] DOB/AGE: [birth year] 32 y.o.  Admit date: 03/13/2022 Discharge date: 03/15/2022  Admission Diagnoses:hyperemesis gravidarum  Discharge Diagnoses:  Principal Problem:   Hyperemesis gravidarum   Discharged Condition: good  Hospital Course: HPI: Krista Stout is a 32 y.o. 32 y.o. G2P0 at 52w4dby LMP who presents to maternity admissions reporting bleeding, sharp pain, nausea and light headedness since returning home from a surgical abortion today.  She had just been discharged from the hospital yesterday after treatment for hyperemesis   States hyperemesis has continued and has not been able to keep down any meds today.  States has been taking Zofran "as prescribed", possible last dose at 10pm.. She denies h/a or fever/chills.   She was readmitted to receive IV fluid and antiemetics. Hospital day 3 she was tolerating diet and requested discharge. Consults: None  Significant Diagnostic Studies: labs: CBC    Component Value Date/Time   WBC 13.0 (H) 03/13/2022 0434   RBC 4.44 03/13/2022 0434   HGB 14.4 03/13/2022 0434   HCT 41.6 03/13/2022 0434   PLT 332 03/13/2022 0434   MCV 93.7 03/13/2022 0434   MCH 32.4 03/13/2022 0434   MCHC 34.6 03/13/2022 0434   RDW 13.2 03/13/2022 0434   LYMPHSABS 3.2 03/01/2022 0115   MONOABS 1.0 03/01/2022 0115   EOSABS 0.2 03/01/2022 0115   BASOSABS 0.0 03/01/2022 0115      Latest Ref Rng & Units 03/14/2022    5:17 AM 03/12/2022    6:45 AM 03/11/2022    3:33 AM  CMP  Glucose 70 - 99 mg/dL 109  101  144   BUN 6 - 20 mg/dL '8  14  7   '$ Creatinine 0.44 - 1.00 mg/dL 0.75  0.63  0.75   Sodium 135 - 145 mmol/L 134  135  132   Potassium 3.5 - 5.1 mmol/L 3.8  3.9  3.9   Chloride 98 - 111 mmol/L 101  100  98   CO2 22 - 32 mmol/L '26  29  25   '$ Calcium 8.9 - 10.3 mg/dL 8.3  8.2  9.1   Total Protein 6.5 - 8.1 g/dL   7.1   Total Bilirubin 0.3 - 1.2 mg/dL   0.7   Alkaline Phos 38 - 126 U/L   63    AST 15 - 41 U/L   19   ALT 0 - 44 U/L   18        Treatments: IV hydration and antiemetics  Discharge Exam: Blood pressure (!) 90/48, pulse (!) 101, temperature 98.3 F (36.8 C), temperature source Oral, resp. rate 16, weight 65.7 kg, last menstrual period 01/28/2022, SpO2 100 %, unknown if currently breastfeeding. General appearance: alert, cooperative, and no distress Head: Normocephalic, without obvious abnormality, atraumatic Resp: normal effort GI: soft, non-tender; bowel sounds normal; no masses,  no organomegaly Extremities: extremities normal, atraumatic, no cyanosis or edema  Disposition: Discharge disposition: 01-Home or Self Care       Discharge Instructions     Discharge patient   Complete by: As directed    Discharge to home as per PACU criteria   Discharge disposition: 01-Home or Self Care   Discharge patient date: 03/15/2022      Allergies as of 03/15/2022   No Known Allergies      Medication List     STOP taking these medications    methylPREDNISolone 4 MG tablet Commonly known as: Medrol  TAKE these medications    metoCLOPramide 10 MG tablet Commonly known as: REGLAN Take 1 tablet (10 mg total) by mouth every 6 (six) hours.   ondansetron 8 MG disintegrating tablet Commonly known as: ZOFRAN-ODT Take 1 tablet (8 mg total) by mouth every 8 (eight) hours as needed for nausea or vomiting.   promethazine 25 MG suppository Commonly known as: PHENERGAN Place 1 suppository (25 mg total) rectally every 6 (six) hours as needed for nausea or vomiting.   scopolamine 1 MG/3DAYS Commonly known as: TRANSDERM-SCOP Place 1 patch (1.5 mg total) onto the skin every 3 (three) days.        Follow-up [place] [facility] Follow up in 3 week(s).   Specialty: Obstetrics and Gynecology Contact information: [place] [phone]                 Signed: Emeterio Reeve 03/15/2022, 10:49 AM

## 2022-03-29 ENCOUNTER — Encounter (HOSPITAL_COMMUNITY): Payer: Self-pay

## 2022-03-29 ENCOUNTER — Emergency Department (HOSPITAL_COMMUNITY): Payer: Medicaid - Out of State

## 2022-03-29 ENCOUNTER — Inpatient Hospital Stay (HOSPITAL_COMMUNITY)
Admission: EM | Admit: 2022-03-29 | Discharge: 2022-04-04 | DRG: 776 | Disposition: A | Discharge status: Home / Self Care | Payer: Medicaid - Out of State | Attending: Obstetrics & Gynecology | Admitting: Obstetrics & Gynecology

## 2022-03-29 ENCOUNTER — Other Ambulatory Visit: Payer: Self-pay

## 2022-03-29 DIAGNOSIS — O99893 Other specified diseases and conditions complicating puerperium: Principal | ICD-10-CM | POA: Diagnosis present

## 2022-03-29 DIAGNOSIS — K219 Gastro-esophageal reflux disease without esophagitis: Secondary | ICD-10-CM | POA: Diagnosis not present

## 2022-03-29 DIAGNOSIS — R112 Nausea with vomiting, unspecified: Secondary | ICD-10-CM

## 2022-03-29 DIAGNOSIS — O9081 Anemia of the puerperium: Secondary | ICD-10-CM | POA: Diagnosis present

## 2022-03-29 DIAGNOSIS — Z5982 Transportation insecurity: Secondary | ICD-10-CM

## 2022-03-29 DIAGNOSIS — R1115 Cyclical vomiting syndrome unrelated to migraine: Secondary | ICD-10-CM | POA: Diagnosis present

## 2022-03-29 DIAGNOSIS — O210 Mild hyperemesis gravidarum: Secondary | ICD-10-CM | POA: Diagnosis present

## 2022-03-29 DIAGNOSIS — J45909 Unspecified asthma, uncomplicated: Secondary | ICD-10-CM | POA: Diagnosis present

## 2022-03-29 DIAGNOSIS — N83209 Unspecified ovarian cyst, unspecified side: Secondary | ICD-10-CM | POA: Diagnosis present

## 2022-03-29 DIAGNOSIS — Z905 Acquired absence of kidney: Secondary | ICD-10-CM

## 2022-03-29 DIAGNOSIS — Z90721 Acquired absence of ovaries, unilateral: Secondary | ICD-10-CM

## 2022-03-29 DIAGNOSIS — R1084 Generalized abdominal pain: Principal | ICD-10-CM

## 2022-03-29 DIAGNOSIS — F1210 Cannabis abuse, uncomplicated: Secondary | ICD-10-CM | POA: Diagnosis present

## 2022-03-29 DIAGNOSIS — D62 Acute posthemorrhagic anemia: Secondary | ICD-10-CM | POA: Diagnosis present

## 2022-03-29 DIAGNOSIS — O9963 Diseases of the digestive system complicating the puerperium: Secondary | ICD-10-CM | POA: Diagnosis present

## 2022-03-29 DIAGNOSIS — Z79899 Other long term (current) drug therapy: Secondary | ICD-10-CM

## 2022-03-29 DIAGNOSIS — N83202 Unspecified ovarian cyst, left side: Secondary | ICD-10-CM | POA: Diagnosis present

## 2022-03-29 DIAGNOSIS — Z87891 Personal history of nicotine dependence: Secondary | ICD-10-CM

## 2022-03-29 DIAGNOSIS — K661 Hemoperitoneum: Secondary | ICD-10-CM | POA: Diagnosis present

## 2022-03-29 HISTORY — DX: Personal history of other medical treatment: Z92.89

## 2022-03-29 LAB — CBC WITH DIFFERENTIAL/PLATELET
Abs Immature Granulocytes: 0.01 10*3/uL (ref 0.00–0.07)
Basophils Absolute: 0 10*3/uL (ref 0.0–0.1)
Basophils Relative: 0 %
Eosinophils Absolute: 0 10*3/uL (ref 0.0–0.5)
Eosinophils Relative: 0 %
HCT: 36.1 % (ref 36.0–46.0)
Hemoglobin: 11.9 g/dL — ABNORMAL LOW (ref 12.0–15.0)
Immature Granulocytes: 0 %
Lymphocytes Relative: 18 %
Lymphs Abs: 1.3 10*3/uL (ref 0.7–4.0)
MCH: 31.6 pg (ref 26.0–34.0)
MCHC: 33 g/dL (ref 30.0–36.0)
MCV: 96 fL (ref 80.0–100.0)
Monocytes Absolute: 0.3 10*3/uL (ref 0.1–1.0)
Monocytes Relative: 5 %
Neutro Abs: 5.6 10*3/uL (ref 1.7–7.7)
Neutrophils Relative %: 77 %
Platelets: 332 10*3/uL (ref 150–400)
RBC: 3.76 MIL/uL — ABNORMAL LOW (ref 3.87–5.11)
RDW: 13.2 % (ref 11.5–15.5)
WBC: 7.2 10*3/uL (ref 4.0–10.5)
nRBC: 0 % (ref 0.0–0.2)

## 2022-03-29 LAB — COMPREHENSIVE METABOLIC PANEL
ALT: 21 U/L (ref 0–44)
AST: 23 U/L (ref 15–41)
Albumin: 3.5 g/dL (ref 3.5–5.0)
Alkaline Phosphatase: 72 U/L (ref 38–126)
Anion gap: 9 (ref 5–15)
BUN: 5 mg/dL — ABNORMAL LOW (ref 6–20)
CO2: 25 mmol/L (ref 22–32)
Calcium: 8.4 mg/dL — ABNORMAL LOW (ref 8.9–10.3)
Chloride: 102 mmol/L (ref 98–111)
Creatinine, Ser: 0.76 mg/dL (ref 0.44–1.00)
GFR, Estimated: >60 mL/min (ref >60)
Glucose, Bld: 111 mg/dL — ABNORMAL HIGH (ref 70–99)
Potassium: 3.4 mmol/L — ABNORMAL LOW (ref 3.5–5.1)
Sodium: 136 mmol/L (ref 135–145)
Total Bilirubin: 0.5 mg/dL (ref 0.3–1.2)
Total Protein: 6.6 g/dL (ref 6.5–8.1)

## 2022-03-29 LAB — URINALYSIS, ROUTINE W REFLEX MICROSCOPIC
Bilirubin Urine: NEGATIVE
Glucose, UA: NEGATIVE mg/dL
Ketones, ur: 20 mg/dL — AB
Nitrite: NEGATIVE
Protein, ur: NEGATIVE mg/dL
Specific Gravity, Urine: 1.019 (ref 1.005–1.030)
pH: 5 (ref 5.0–8.0)

## 2022-03-29 LAB — MAGNESIUM: Magnesium: 1.7 mg/dL (ref 1.7–2.4)

## 2022-03-29 LAB — HCG, QUANTITATIVE, PREGNANCY: hCG, Beta Chain, Quant, S: 715 m[IU]/mL — ABNORMAL HIGH (ref <5)

## 2022-03-29 LAB — LIPASE, BLOOD: Lipase: 30 U/L (ref 11–51)

## 2022-03-29 MED ORDER — IOPAMIDOL (ISOVUE-370) INJECTION 76%
75.0000 mL | Freq: Once | INTRAVENOUS | Status: AC | PRN
  Administered 2022-03-29: 75 mL via INTRAVENOUS

## 2022-03-29 MED ORDER — FENTANYL CITRATE PF 50 MCG/ML IJ SOSY
50.0000 ug | PREFILLED_SYRINGE | Freq: Once | INTRAMUSCULAR | Status: AC
  Administered 2022-03-29: 50 ug via INTRAVENOUS
  Filled 2022-03-29: qty 1

## 2022-03-29 MED ORDER — LACTATED RINGERS IV BOLUS
1000.0000 mL | Freq: Once | INTRAVENOUS | Status: AC
  Administered 2022-03-29: 1000 mL via INTRAVENOUS

## 2022-03-29 MED ORDER — METOCLOPRAMIDE HCL 5 MG/ML IJ SOLN
10.0000 mg | Freq: Once | INTRAMUSCULAR | Status: AC
  Administered 2022-03-29: 10 mg via INTRAVENOUS
  Filled 2022-03-29: qty 2

## 2022-03-29 MED ORDER — DIPHENHYDRAMINE HCL 50 MG/ML IJ SOLN
25.0000 mg | Freq: Once | INTRAMUSCULAR | Status: AC
  Administered 2022-03-29: 25 mg via INTRAVENOUS
  Filled 2022-03-29: qty 1

## 2022-03-29 MED ORDER — MORPHINE SULFATE (PF) 2 MG/ML IV SOLN
2.0000 mg | Freq: Once | INTRAVENOUS | Status: AC
  Administered 2022-03-29: 2 mg via INTRAVENOUS
  Filled 2022-03-29: qty 1

## 2022-03-29 MED ORDER — ONDANSETRON HCL 4 MG/2ML IJ SOLN
4.0000 mg | Freq: Once | INTRAMUSCULAR | Status: AC
  Administered 2022-03-29: 4 mg via INTRAVENOUS
  Filled 2022-03-29: qty 2

## 2022-03-29 NOTE — ED Notes (Signed)
Pt back in room from imaging at this time.

## 2022-03-29 NOTE — ED Notes (Signed)
Pt received for care at 1905.  At this time, pt off unit for imaging.  This RN to introduce self to pt upon return.

## 2022-03-29 NOTE — ED Provider Notes (Signed)
[name]   CSN: [id number] Arrival date & time: 03/29/22  1310     History {Add pertinent medical, surgical, social history, OB history to HPI:1} Chief Complaint  Patient presents with   Abdominal Pain   Emesis    Krista Stout is a 32 y.o. female with PMH cyclical vomiting syndrome and recent surgical abortion on March 06, 2022.  This was performed at [facility].  She states 4 days ago she began to have uncontrolled vomiting despite her normal regimen of Reglan, Zofran, Benadryl at home.  She has not been able to keep anything down and that approximately a day and a half ago she began to have severe generalized abdominal pain.  On arrival to this facility she started having vaginal bleeding, no passing clots. Denies fever, chest pain, shortness of breath, skin changes, other vaginal discharge, dysuria or hematuria, diarrhea.   Abdominal Pain Associated symptoms: vomiting   Emesis Associated symptoms: abdominal pain        Home Medications Prior to Admission medications   Medication Sig Start Date End Date Taking? Authorizing Provider  metoCLOPramide (REGLAN) 10 MG tablet Take 1 tablet (10 mg total) by mouth every 6 (six) hours. 03/06/22   Radene Gunning, MD  ondansetron (ZOFRAN-ODT) 8 MG disintegrating tablet Take 1 tablet (8 mg total) by mouth every 8 (eight) hours as needed for nausea or vomiting. 03/11/22   Griffin Basil, MD  promethazine (PHENERGAN) 25 MG suppository Place 1 suppository (25 mg total) rectally every 6 (six) hours as needed for nausea or vomiting. 03/06/22   Radene Gunning, MD  scopolamine (TRANSDERM-SCOP) 1 MG/3DAYS Place 1 patch (1.5 mg total) onto the skin every 3 (three) days. 03/07/22   Radene Gunning, MD      Allergies    Patient has no known allergies.    Review of Systems   See HPI  Physical Exam Updated Vital Signs BP 95/62   Pulse 82   Temp 98 F (36.7 C)   Resp 16   Ht 5'  2" (1.575 m)   Wt 67.1 kg   LMP 01/28/2022 (Exact Date)   SpO2 100%   BMI 27.07 kg/m  Physical Exam Vitals and nursing note reviewed.  Constitutional:      General: She is not in acute distress.    Appearance: She is well-developed.     Comments: Appears uncomfortable  HENT:     Head: Normocephalic and atraumatic.  Eyes:     General: No scleral icterus.    Conjunctiva/sclera: Conjunctivae normal.  Cardiovascular:     Rate and Rhythm: Normal rate and regular rhythm.     Heart sounds: No murmur heard. Pulmonary:     Effort: Pulmonary effort is normal. No respiratory distress.     Breath sounds: Normal breath sounds.  Abdominal:     General: Abdomen is flat. There is no distension.     Palpations: Abdomen is soft.     Tenderness: There is generalized abdominal tenderness.     Hernia: No hernia is present.  Musculoskeletal:        General: No swelling.     Cervical back: Neck supple.  Skin:    General: Skin is warm and dry.     Capillary Refill: Capillary refill takes less than 2 seconds.  Neurological:     Mental Status: She is alert.  Psychiatric:        Mood and Affect: Mood normal.  ED Results / Procedures / Treatments   Labs (all labs ordered are listed, but only abnormal results are displayed) Labs Reviewed  CBC WITH DIFFERENTIAL/PLATELET - Abnormal; Notable for the following components:      Result Value   RBC 3.76 (*)    Hemoglobin 11.9 (*)    All other components within normal limits  COMPREHENSIVE METABOLIC PANEL - Abnormal; Notable for the following components:   Potassium 3.4 (*)    Glucose, Bld 111 (*)    BUN 5 (*)    Calcium 8.4 (*)    All other components within normal limits  HCG, QUANTITATIVE, PREGNANCY - Abnormal; Notable for the following components:   hCG, Beta Chain, Quant, S 715 (*)    All other components within normal limits  URINALYSIS, ROUTINE W REFLEX MICROSCOPIC - Abnormal; Notable for the following components:   Hgb urine  dipstick MODERATE (*)    Ketones, ur 20 (*)    Leukocytes,Ua TRACE (*)    Bacteria, UA RARE (*)    All other components within normal limits  LIPASE, BLOOD  MAGNESIUM  HCG, QUANTITATIVE, PREGNANCY  CBC  TYPE AND SCREEN    Radiology US OB Transvaginal  Result Date: 03/29/2022 CLINICAL DATA:  Abdominal and pelvic pain, nausea and vomiting, pregnant EXAM: OBSTETRIC <14 WK [place] AND TRANSVAGINAL OB US TECHNIQUE: Both transabdominal and transvaginal ultrasound examinations were performed for complete evaluation of the gestation as well as the maternal uterus, adnexal regions, and pelvic cul-de-sac. Transvaginal technique was performed to assess early pregnancy. COMPARISON:  Same-day CT abdomen pelvis, 03/29/2022, pelvic ultrasound, 03/01/2022 FINDINGS: Intrauterine gestational sac: None Yolk sac:  Not Visualized. Embryo:  Not Visualized. Cardiac Activity: Not Visualized. Heart Rate: Visualized Subchorionic hemorrhage:  None visualized. Maternal uterus/adnexae: Uterus: 6.8 x 4.8 x 4.8 cm. Normal appearance of the uterus. Endometrial thickness 1.0 cm. Right ovary: Not visualized. Left ovary: Left ovary measures 3.3 x 2.3 x 3.2 cm. Volume = 13 mL. Heterogeneously echogenic mass of the left adnexa adjacent the left ovary measuring 4.2 x 3.2 x 3.1 cm. Moderate volume of heterogeneous free fluid in the low pelvis. IMPRESSION: 1. Heterogeneously echogenic mass of the left adnexa adjacent to the left ovary measuring 4.2 cm, highly concerning for ruptured ectopic pregnancy given the presence of elevated beta hCG and findings of both CT and ultrasound. Note however that the patient had an intrauterine pregnancy on ultrasound examination dated 03/01/2022, with recent report of surgical abortion. No imaging evidence of heterotopic pregnancy on prior examination. 2. Moderate volume of heterogeneous free fluid in the low pelvis, concerning for hemoperitoneum. 3.  No candidate intrauterine pregnancy. 4.  Right ovary not  visualized by ultrasound. These results were called by telephone at the time of interpretation on 03/29/2022 at 8:01 pm to Dr. Carmin Muskrat , who verbally acknowledged these results. Electronically Signed   By: Delanna Ahmadi M.D.   On: 03/29/2022 20:17   US OB Comp Less 14 Wks  Result Date: 03/29/2022 CLINICAL DATA:  Abdominal and pelvic pain, nausea and vomiting, pregnant EXAM: OBSTETRIC <14 WK [place] AND TRANSVAGINAL OB US TECHNIQUE: Both transabdominal and transvaginal ultrasound examinations were performed for complete evaluation of the gestation as well as the maternal uterus, adnexal regions, and pelvic cul-de-sac. Transvaginal technique was performed to assess early pregnancy. COMPARISON:  Same-day CT abdomen pelvis, 03/29/2022, pelvic ultrasound, 03/01/2022 FINDINGS: Intrauterine gestational sac: None Yolk sac:  Not Visualized. Embryo:  Not Visualized. Cardiac Activity: Not Visualized. Heart Rate: Visualized Subchorionic hemorrhage:  None  visualized. Maternal uterus/adnexae: Uterus: 6.8 x 4.8 x 4.8 cm. Normal appearance of the uterus. Endometrial thickness 1.0 cm. Right ovary: Not visualized. Left ovary: Left ovary measures 3.3 x 2.3 x 3.2 cm. Volume = 13 mL. Heterogeneously echogenic mass of the left adnexa adjacent the left ovary measuring 4.2 x 3.2 x 3.1 cm. Moderate volume of heterogeneous free fluid in the low pelvis. IMPRESSION: 1. Heterogeneously echogenic mass of the left adnexa adjacent to the left ovary measuring 4.2 cm, highly concerning for ruptured ectopic pregnancy given the presence of elevated beta hCG and findings of both CT and ultrasound. Note however that the patient had an intrauterine pregnancy on ultrasound examination dated 03/01/2022, with recent report of surgical abortion. No imaging evidence of heterotopic pregnancy on prior examination. 2. Moderate volume of heterogeneous free fluid in the low pelvis, concerning for hemoperitoneum. 3.  No candidate intrauterine pregnancy. 4.   Right ovary not visualized by ultrasound. These results were called by telephone at the time of interpretation on 03/29/2022 at 8:01 pm to Dr. Carmin Muskrat , who verbally acknowledged these results. Electronically Signed   By: Delanna Ahmadi M.D.   On: 03/29/2022 20:17   CT ABDOMEN PELVIS W CONTRAST  Result Date: 03/29/2022 CLINICAL DATA:  Abdomen pain EXAM: CT ABDOMEN AND PELVIS WITH CONTRAST TECHNIQUE: Multidetector CT imaging of the abdomen and pelvis was performed using the standard protocol following bolus administration of intravenous contrast. RADIATION DOSE REDUCTION: This exam was performed according to the departmental dose-optimization program which includes automated exposure control, adjustment of the mA and/or kV according to patient size and/or use of iterative reconstruction technique. CONTRAST:  74mL ISOVUE-370 IOPAMIDOL (ISOVUE-370) INJECTION 76% COMPARISON:  CT 01/21/2019 FINDINGS: Lower chest: Lung bases demonstrate no acute airspace disease. Hepatobiliary: No focal liver abnormality is seen. No gallstones, gallbladder wall thickening, or biliary dilatation. Pancreas: Unremarkable. No pancreatic ductal dilatation or surrounding inflammatory changes. Spleen: Normal in size without focal abnormality. Adrenals/Urinary Tract: Adrenal glands are unremarkable. Kidneys are normal, without renal calculi, focal lesion, or hydronephrosis. Bladder is unremarkable. Stomach/Bowel: Stomach is within normal limits. Appendix appears normal. No evidence of bowel wall thickening, distention, or inflammatory changes. Vascular/Lymphatic: No significant vascular findings are present. No enlarged abdominal or pelvic lymph nodes. Reproductive: Uterus is unremarkable. Interim development of a complex mass at the left adnexa measuring 5.2 by 4.7 cm. Serpiginous hyperdensities in the region of the complex mass suspected to represent vascular hyperemia though small foci of extravasation not entirely excluded. Other:  Moderate volume fluid in the pelvis extending up to the liver and spleen with increased density values consistent with hemoperitoneum Musculoskeletal: No acute or significant osseous findings. IMPRESSION: 1. Complex heterogenous left adnexal mass measuring up to 5.2 cm with areas of serpiginous hyperdensity probably reflecting vascular hyperemia with small foci of extravasation difficult to exclude. Moderate volume hemorrhagic fluid in the pelvis with small volume hemorrhagic fluid in the perihepatic and perisplenic regions. Differential considerations include hemorrhagic ovarian cyst with bleeding, hemo salpinx, and given history of elevated HCG, ectopic pregnancy. Recommend OB consultation and consider correlation with pelvic ultrasound Critical Value/emergent results were called by telephone at the time of interpretation on 03/29/2022 at 6:08 pm to provider Dr. Vanita Panda, Who verbally acknowledged these results. Electronically Signed   By: Donavan Foil M.D.   On: 03/29/2022 18:09   Medications Ordered in ED Medications  ondansetron (ZOFRAN) injection 4 mg (4 mg Intravenous Given 03/29/22 1745)  diphenhydrAMINE (BENADRYL) injection 25 mg (25 mg Intravenous Given 03/29/22 1745)  fentaNYL (SUBLIMAZE) injection 50 mcg (50 mcg Intravenous Given 03/29/22 1745)  iopamidol (ISOVUE-370) 76 % injection 75 mL (75 mLs Intravenous Contrast Given 03/29/22 1738)  metoCLOPramide (REGLAN) injection 10 mg (10 mg Intravenous Given 03/29/22 2035)  lactated ringers bolus 1,000 mL (1,000 mLs Intravenous New Bag/Given 03/29/22 2042)  morphine (PF) 2 MG/ML injection 2 mg (2 mg Intravenous Given 03/29/22 2035)    ED Course/ Medical Decision Making/ A&P Clinical Course as of 03/29/22 2337  Ludwig Clarks Mar 29, 2022  2004 Dr Rip Harbour [[place]]    Clinical Course User Index [[place]] Bradd Canary, MD   {  Medical Decision Making Amount and/or Complexity of Data Reviewed Labs: ordered. Radiology: ordered.  Risk Prescription drug  management.   Patient presented hemodynamically stable, not tachycardic or febrile.  She was having significant abdominal pain that was generalized on exam with guarding.  She was also having extreme nausea.  She was given IV Benadryl as well as Zofran with minimal relief of her symptoms so Reglan was added with relief of the nausea.  Patient had some relief with fentanyl for abdominal pain, relief when given morphine.  Labs overall reassuring, no leukocytosis noted no significant electrolyte abnormalities or signs of AKI.  Patient not concerning for infection.  HCG at this visit 715, which is down from 24,00 4 weeks ago.  CT was notable for possible hemoperitoneum as well as left adnexal mass versus hematosalpinx.  Recommended ultrasound for further evaluation.  Ultrasound did reveal concern for hemoperitoneum as well as possible ovarian mass.  Discussed this with OB/GYN who recommended repeat CBC as well as hCG to note trends and type and screen in case patient will need blood.  {Document critical care time when appropriate:1} {Document review of labs and clinical decision tools ie heart score, Chads2Vasc2 etc:1}  {Document your independent review of radiology images, and any outside records:1} {Document your discussion with family members, caretakers, and with consultants:1} {Document social determinants of health affecting pt's care:1} {Document your decision making why or why not admission, treatments were needed:1} Final Clinical Impression(s) / ED Diagnoses Final diagnoses:  Generalized abdominal pain  Nausea and vomiting, unspecified vomiting type    Rx / DC Orders ED Discharge Orders     None

## 2022-03-29 NOTE — ED Notes (Signed)
Pt provided with heating pack per request.  Pt endorses ongoing pain and nausea at this time.  Jeannine Kitten MD notified of same.

## 2022-03-29 NOTE — ED Triage Notes (Signed)
C/o n/v onset 3 days ago states she is using  antiemetic medications without relief. C/o abd. Pain and vaginal pain onset 2 days ago worse today, states she had an elective AB 2/21/ states she just noticed vaginal spotting today. Last BM yest was diarrhea . Denies urinary sx/

## 2022-03-29 NOTE — ED Provider Triage Note (Cosign Needed Addendum)
Emergency Medicine Provider Triage Evaluation Note  Krista Stout , a 32 y.o. female  was evaluated in triage.  Pt complains of diffuse abdominal pain with N/V for the past three days. The patient reports that she has a h/o cyclical vomiting syndrome, but this abdominal pain in worse. She had a surgical abortion at the end of February and reports that she started having vaginal bleeding on arrival to the ER. No fevers, chest pain, SOB, or urinary symptoms.  Review of Systems  Positive:  Negative:   Physical Exam  BP (!) 133/107   Pulse 86   Temp 98 F (36.7 C) (Oral)   Resp 17   Ht 5\' 2"  (1.575 m)   Wt 67.1 kg   LMP 01/28/2022 (Exact Date)   SpO2 100%   BMI 27.07 kg/m  Gen:   Awake, no distress   Resp:  Normal effort  MSK:   Moves extremities without difficulty  Other:  Diffuse abdominal tenderness. Soft. No guarding or rebound.   Medical Decision Making  Medically screening exam initiated at 1:27 PM.  Appropriate orders placed.  [name] was informed that the remainder of the evaluation will be completed by another provider, this initial triage assessment does not replace that evaluation, and the importance of remaining in the ED until their evaluation is complete.  CT abd and labs ordered. Offered Zofran, but requesting IV. She reports that she will vomit ODT.   [name], PA-C 03/29/22 1329    [name], [name] 03/29/22 1329

## 2022-03-30 ENCOUNTER — Encounter (HOSPITAL_COMMUNITY): Payer: Self-pay | Admitting: Obstetrics and Gynecology

## 2022-03-30 DIAGNOSIS — F1210 Cannabis abuse, uncomplicated: Secondary | ICD-10-CM | POA: Diagnosis present

## 2022-03-30 DIAGNOSIS — Z5982 Transportation insecurity: Secondary | ICD-10-CM | POA: Diagnosis not present

## 2022-03-30 DIAGNOSIS — N83209 Unspecified ovarian cyst, unspecified side: Secondary | ICD-10-CM | POA: Diagnosis not present

## 2022-03-30 DIAGNOSIS — O99893 Other specified diseases and conditions complicating puerperium: Secondary | ICD-10-CM | POA: Diagnosis present

## 2022-03-30 DIAGNOSIS — Z87891 Personal history of nicotine dependence: Secondary | ICD-10-CM | POA: Diagnosis not present

## 2022-03-30 DIAGNOSIS — R112 Nausea with vomiting, unspecified: Secondary | ICD-10-CM | POA: Diagnosis not present

## 2022-03-30 DIAGNOSIS — R1112 Projectile vomiting: Secondary | ICD-10-CM | POA: Diagnosis not present

## 2022-03-30 DIAGNOSIS — R1115 Cyclical vomiting syndrome unrelated to migraine: Secondary | ICD-10-CM | POA: Diagnosis present

## 2022-03-30 DIAGNOSIS — Z905 Acquired absence of kidney: Secondary | ICD-10-CM | POA: Diagnosis not present

## 2022-03-30 DIAGNOSIS — K661 Hemoperitoneum: Secondary | ICD-10-CM | POA: Diagnosis present

## 2022-03-30 DIAGNOSIS — Z79899 Other long term (current) drug therapy: Secondary | ICD-10-CM | POA: Diagnosis not present

## 2022-03-30 DIAGNOSIS — N83202 Unspecified ovarian cyst, left side: Secondary | ICD-10-CM | POA: Diagnosis present

## 2022-03-30 DIAGNOSIS — J45909 Unspecified asthma, uncomplicated: Secondary | ICD-10-CM | POA: Diagnosis present

## 2022-03-30 DIAGNOSIS — D62 Acute posthemorrhagic anemia: Secondary | ICD-10-CM | POA: Diagnosis present

## 2022-03-30 DIAGNOSIS — F1220 Cannabis dependence, uncomplicated: Secondary | ICD-10-CM | POA: Diagnosis not present

## 2022-03-30 DIAGNOSIS — Z90721 Acquired absence of ovaries, unilateral: Secondary | ICD-10-CM | POA: Diagnosis not present

## 2022-03-30 DIAGNOSIS — O9081 Anemia of the puerperium: Secondary | ICD-10-CM | POA: Diagnosis present

## 2022-03-30 DIAGNOSIS — K219 Gastro-esophageal reflux disease without esophagitis: Secondary | ICD-10-CM | POA: Diagnosis not present

## 2022-03-30 DIAGNOSIS — R1084 Generalized abdominal pain: Secondary | ICD-10-CM | POA: Diagnosis present

## 2022-03-30 DIAGNOSIS — N838 Other noninflammatory disorders of ovary, fallopian tube and broad ligament: Secondary | ICD-10-CM | POA: Diagnosis not present

## 2022-03-30 DIAGNOSIS — O9963 Diseases of the digestive system complicating the puerperium: Secondary | ICD-10-CM | POA: Diagnosis present

## 2022-03-30 LAB — CBC
HCT: 32.8 % — ABNORMAL LOW (ref 36.0–46.0)
HCT: 30.4 % — ABNORMAL LOW (ref 36.0–46.0)
HCT: 31.7 % — ABNORMAL LOW (ref 36.0–46.0)
HCT: 28.4 % — ABNORMAL LOW (ref 36.0–46.0)
Hemoglobin: 10.6 g/dL — ABNORMAL LOW (ref 12.0–15.0)
Hemoglobin: 9.9 g/dL — ABNORMAL LOW (ref 12.0–15.0)
Hemoglobin: 10.1 g/dL — ABNORMAL LOW (ref 12.0–15.0)
Hemoglobin: 9.7 g/dL — ABNORMAL LOW (ref 12.0–15.0)
MCH: 31 pg (ref 26.0–34.0)
MCH: 31.2 pg (ref 26.0–34.0)
MCH: 30.9 pg (ref 26.0–34.0)
MCH: 32.2 pg (ref 26.0–34.0)
MCHC: 32.3 g/dL (ref 30.0–36.0)
MCHC: 32.6 g/dL (ref 30.0–36.0)
MCHC: 31.9 g/dL (ref 30.0–36.0)
MCHC: 34.2 g/dL (ref 30.0–36.0)
MCV: 95.9 fL (ref 80.0–100.0)
MCV: 95.9 fL (ref 80.0–100.0)
MCV: 96.9 fL (ref 80.0–100.0)
MCV: 94.4 fL (ref 80.0–100.0)
Platelets: 284 10*3/uL (ref 150–400)
Platelets: 286 10*3/uL (ref 150–400)
Platelets: 273 10*3/uL (ref 150–400)
Platelets: 258 10*3/uL (ref 150–400)
RBC: 3.42 MIL/uL — ABNORMAL LOW (ref 3.87–5.11)
RBC: 3.17 MIL/uL — ABNORMAL LOW (ref 3.87–5.11)
RBC: 3.27 MIL/uL — ABNORMAL LOW (ref 3.87–5.11)
RBC: 3.01 MIL/uL — ABNORMAL LOW (ref 3.87–5.11)
RDW: 13.4 % (ref 11.5–15.5)
RDW: 13.4 % (ref 11.5–15.5)
RDW: 13.4 % (ref 11.5–15.5)
RDW: 13.4 % (ref 11.5–15.5)
WBC: 5.3 10*3/uL (ref 4.0–10.5)
WBC: 4.7 10*3/uL (ref 4.0–10.5)
WBC: 4 10*3/uL (ref 4.0–10.5)
WBC: 3.9 10*3/uL — ABNORMAL LOW (ref 4.0–10.5)
nRBC: 0 % (ref 0.0–0.2)
nRBC: 0 % (ref 0.0–0.2)
nRBC: 0 % (ref 0.0–0.2)
nRBC: 0 % (ref 0.0–0.2)

## 2022-03-30 LAB — TYPE AND SCREEN
ABO/RH(D): O POS
Antibody Screen: NEGATIVE

## 2022-03-30 LAB — RAPID HIV SCREEN (HIV 1/2 AB+AG)
HIV 1/2 Antibodies: NONREACTIVE
HIV-1 P24 Antigen - HIV24: NONREACTIVE

## 2022-03-30 LAB — HCG, QUANTITATIVE, PREGNANCY
hCG, Beta Chain, Quant, S: 630 m[IU]/mL — ABNORMAL HIGH (ref <5)
hCG, Beta Chain, Quant, S: 630 m[IU]/mL — ABNORMAL HIGH (ref <5)
hCG, Beta Chain, Quant, S: 593 m[IU]/mL — ABNORMAL HIGH (ref <5)

## 2022-03-30 LAB — RPR: RPR Ser Ql: NONREACTIVE

## 2022-03-30 MED ORDER — HYDROMORPHONE HCL 1 MG/ML IJ SOLN
0.5000 mg | INTRAMUSCULAR | Status: DC | PRN
  Administered 2022-03-30 – 2022-04-04 (×28): 1 mg via INTRAVENOUS
  Filled 2022-03-30 (×28): qty 1

## 2022-03-30 MED ORDER — METOCLOPRAMIDE HCL 5 MG/ML IJ SOLN
10.0000 mg | Freq: Four times a day (QID) | INTRAMUSCULAR | Status: AC
  Administered 2022-03-30 (×2): 10 mg via INTRAVENOUS
  Filled 2022-03-30 (×2): qty 2

## 2022-03-30 MED ORDER — HYDROMORPHONE HCL 1 MG/ML IJ SOLN
0.5000 mg | INTRAMUSCULAR | Status: AC | PRN
  Administered 2022-03-30 (×2): 0.5 mg via INTRAVENOUS
  Filled 2022-03-30 (×2): qty 0.5

## 2022-03-30 MED ORDER — POTASSIUM CHLORIDE IN NACL 40-0.9 MEQ/L-% IV SOLN
INTRAVENOUS | Status: DC
  Filled 2022-03-30 (×5): qty 1000

## 2022-03-30 MED ORDER — ONDANSETRON 4 MG PO TBDP
4.0000 mg | ORAL_TABLET | Freq: Three times a day (TID) | ORAL | Status: DC
  Administered 2022-04-01: 4 mg via ORAL
  Filled 2022-03-30 (×6): qty 1

## 2022-03-30 MED ORDER — ONDANSETRON HCL 4 MG/2ML IJ SOLN
4.0000 mg | Freq: Four times a day (QID) | INTRAMUSCULAR | Status: DC | PRN
  Administered 2022-03-30 – 2022-04-04 (×14): 4 mg via INTRAVENOUS
  Filled 2022-03-30 (×14): qty 2

## 2022-03-30 MED ORDER — FENTANYL CITRATE PF 50 MCG/ML IJ SOSY
50.0000 ug | PREFILLED_SYRINGE | Freq: Once | INTRAMUSCULAR | Status: AC
  Administered 2022-03-30: 50 ug via INTRAVENOUS
  Filled 2022-03-30: qty 1

## 2022-03-30 MED ORDER — METOCLOPRAMIDE HCL 5 MG/ML IJ SOLN
10.0000 mg | Freq: Four times a day (QID) | INTRAMUSCULAR | Status: DC
  Administered 2022-03-30 – 2022-04-04 (×20): 10 mg via INTRAVENOUS
  Filled 2022-03-30 (×21): qty 2

## 2022-03-30 MED ORDER — ONDANSETRON HCL 4 MG/2ML IJ SOLN
4.0000 mg | Freq: Once | INTRAMUSCULAR | Status: AC
  Administered 2022-03-30: 4 mg via INTRAVENOUS
  Filled 2022-03-30: qty 2

## 2022-03-30 MED ORDER — DIPHENHYDRAMINE HCL 50 MG/ML IJ SOLN
12.5000 mg | Freq: Four times a day (QID) | INTRAMUSCULAR | Status: DC | PRN
  Administered 2022-03-30 – 2022-04-04 (×20): 12.5 mg via INTRAVENOUS
  Filled 2022-03-30 (×20): qty 1

## 2022-03-30 MED ORDER — PANTOPRAZOLE SODIUM 40 MG IV SOLR
40.0000 mg | Freq: Once | INTRAVENOUS | Status: AC
  Administered 2022-03-30: 40 mg via INTRAVENOUS
  Filled 2022-03-30: qty 10

## 2022-03-30 MED ORDER — DIPHENHYDRAMINE HCL 50 MG/ML IJ SOLN
25.0000 mg | Freq: Once | INTRAMUSCULAR | Status: AC
  Administered 2022-03-30: 25 mg via INTRAVENOUS
  Filled 2022-03-30: qty 1

## 2022-03-30 MED ORDER — OXYCODONE HCL 5 MG PO TABS
5.0000 mg | ORAL_TABLET | Freq: Four times a day (QID) | ORAL | Status: DC | PRN
  Filled 2022-03-30: qty 2

## 2022-03-30 MED ORDER — PROCHLORPERAZINE EDISYLATE 10 MG/2ML IJ SOLN
10.0000 mg | Freq: Once | INTRAMUSCULAR | Status: AC
  Administered 2022-03-30: 10 mg via INTRAVENOUS
  Filled 2022-03-30: qty 2

## 2022-03-30 NOTE — Progress Notes (Signed)
Patient transfer from ED,alert and oriented,able to make needs known,complain of pain on a pain scale of 10/10 to abdomen.Dilaudid 0.5 mg given,patient made comfortable in bed,call light in reach and bed in low position,will continue to monitor.

## 2022-03-30 NOTE — ED Notes (Signed)
Pt provided with heating packs to assist with pain at this time.

## 2022-03-30 NOTE — ED Notes (Signed)
Pt being transferred to inpatient unit at this time via stretcher with all belongings. 

## 2022-03-30 NOTE — ED Notes (Addendum)
ED TO INPATIENT HANDOFF REPORT  ED Nurse Name and Phone #: Alinda Deem [phone]  S Name/Age/Gender Krista Stout 32 y.o. female Room/Bed: 022C/022C  Code Status   Code Status: Full Code  Home/SNF/Other Home Patient oriented to: self, place, time, and situation Is this baseline? Yes   Triage Complete: Triage complete  Chief Complaint Hemorrhagic cyst of ovary [id number]  Triage Note C/o n/v onset 3 days ago states she is using  antiemetic medications without relief. C/o abd. Pain and vaginal pain onset 2 days ago worse today, states she had an elective AB 2/21/ states she just noticed vaginal spotting today. Last BM yest was diarrhea . Denies urinary sx/    Allergies No Known Allergies  Level of Care/Admitting Diagnosis ED Disposition     ED Disposition  Admit   Condition  --   Comment  Hospital Area: [facility] [[id number]]  Level of Care: Med-Surg [16]  May place patient in observation at [facility] or [facility] if equivalent level of care is available:: No  Covid Evaluation: Asymptomatic - no recent exposure (last 10 days) testing not required  Diagnosis: Hemorrhagic cyst of ovary [id number]  Admitting Physician: Aletha Halim [id number]  Attending Physician: Aletha Halim [phone]  Bed request comments: [place]          B Medical/Surgery History Past Medical History:  Diagnosis Date   Asthma    Cyclical vomiting syndrome    Past Surgical History:  Procedure Laterality Date   HERNIA REPAIR     INDUCED ABORTION  03/12/2022   RIGHT OOPHORECTOMY       A IV Location/Drains/Wounds Patient Lines/Drains/Airways Status     Active Line/Drains/Airways     Name Placement date Placement time Site Days   Peripheral IV 03/29/22 20 G Left Antecubital 03/29/22  1337  Antecubital  1            Intake/Output Last 24 hours No intake or output data in the 24 hours ending 03/30/22 0114  Labs/Imaging Results for orders placed or  performed during the hospital encounter of 03/29/22 (from the past 28 hour(s))  hCG, quantitative, pregnancy     Status: Abnormal   Collection Time: 03/29/22  1:27 PM  Result Value Ref Range   hCG, Beta Chain, Quant, S 715 (H) <5 mIU/mL    Comment:          GEST. AGE      CONC.  (mIU/mL)   <=1 WEEK        5 - 50     2 WEEKS       50 - 500     3 WEEKS       100 - 10,000     4 WEEKS     1,000 - 30,000     5 WEEKS     3,500 - 115,000   6-8 WEEKS     12,000 - 270,000    12 WEEKS     15,000 - 220,000        FEMALE AND NON-PREGNANT FEMALE:     LESS THAN 5 mIU/mL Performed at [facility], [place] [address], [place], [place] [address]   CBC with Differential     Status: Abnormal   Collection Time: 03/29/22  1:36 PM  Result Value Ref Range   WBC 7.2 4.0 - 10.5 K/uL   RBC 3.76 (L) 3.87 - 5.11 MIL/uL   Hemoglobin 11.9 (L) 12.0 - 15.0 g/dL   HCT  36.1 36.0 - 46.0 %   MCV 96.0 80.0 - 100.0 fL   MCH 31.6 26.0 - 34.0 pg   MCHC 33.0 30.0 - 36.0 g/dL   RDW 13.2 11.5 - 15.5 %   Platelets 332 150 - 400 K/uL   nRBC 0.0 0.0 - 0.2 %   Neutrophils Relative % 77 %   Neutro Abs 5.6 1.7 - 7.7 K/uL   Lymphocytes Relative 18 %   Lymphs Abs 1.3 0.7 - 4.0 K/uL   Monocytes Relative 5 %   Monocytes Absolute 0.3 0.1 - 1.0 K/uL   Eosinophils Relative 0 %   Eosinophils Absolute 0.0 0.0 - 0.5 K/uL   Basophils Relative 0 %   Basophils Absolute 0.0 0.0 - 0.1 K/uL   Immature Granulocytes 0 %   Abs Immature Granulocytes 0.01 0.00 - 0.07 K/uL    Comment: Performed at [facility] [address], [place], [place] [address]  Comprehensive metabolic panel     Status: Abnormal   Collection Time: 03/29/22  1:36 PM  Result Value Ref Range   Sodium 136 135 - 145 mmol/L   Potassium 3.4 (L) 3.5 - 5.1 mmol/L   Chloride 102 98 - 111 mmol/L   CO2 25 22 - 32 mmol/L   Glucose, Bld 111 (H) 70 - 99 mg/dL    Comment: Glucose reference range applies only to samples taken after fasting for at least 8 hours.   BUN  5 (L) 6 - 20 mg/dL   Creatinine, Ser 0.76 0.44 - 1.00 mg/dL   Calcium 8.4 (L) 8.9 - 10.3 mg/dL   Total Protein 6.6 6.5 - 8.1 g/dL   Albumin 3.5 3.5 - 5.0 g/dL   AST 23 15 - 41 U/L   ALT 21 0 - 44 U/L   Alkaline Phosphatase 72 38 - 126 U/L   Total Bilirubin 0.5 0.3 - 1.2 mg/dL   GFR, Estimated >60 >60 mL/min    Comment: (NOTE) Calculated using the CKD-EPI Creatinine Equation (2021)    Anion gap 9 5 - 15    Comment: Performed at [facility] [address], [place], [place] [address]  Lipase, blood     Status: None   Collection Time: 03/29/22  1:36 PM  Result Value Ref Range   Lipase 30 11 - 51 U/L    Comment: Performed at [facility], [place] [address], [place], [place] [address]  Magnesium     Status: None   Collection Time: 03/29/22  1:36 PM  Result Value Ref Range   Magnesium 1.7 1.7 - 2.4 mg/dL    Comment: Performed at [facility] [address] [place], [place] [address]  Urinalysis, Routine w reflex microscopic -Urine, Clean Catch     Status: Abnormal   Collection Time: 03/29/22  5:19 PM  Result Value Ref Range   Color, Urine YELLOW YELLOW   APPearance CLEAR CLEAR   Specific Gravity, Urine 1.019 1.005 - 1.030   pH 5.0 5.0 - 8.0   Glucose, UA NEGATIVE NEGATIVE mg/dL   Hgb urine dipstick MODERATE (A) NEGATIVE   Bilirubin Urine NEGATIVE NEGATIVE   Ketones, ur 20 (A) NEGATIVE mg/dL   Protein, ur NEGATIVE NEGATIVE mg/dL   Nitrite NEGATIVE NEGATIVE   Leukocytes,Ua TRACE (A) NEGATIVE   RBC / HPF 0-5 0 - 5 RBC/hpf   WBC, UA 0-5 0 - 5 WBC/hpf   Bacteria, UA RARE (A) NONE SEEN   Squamous Epithelial / HPF 0-5  0 - 5 /HPF   Mucus PRESENT     Comment: Performed at [facility], [place] [address] [place], [place] [address]  hCG, quantitative, pregnancy     Status: Abnormal   Collection Time: 03/29/22 11:09 PM  Result Value Ref Range   hCG, Beta Chain, Quant, S 630 (H) <5 mIU/mL    Comment:          GEST. AGE      CONC.  (mIU/mL)   <=1 WEEK        5 -  50     2 WEEKS       50 - 500     3 WEEKS       100 - 10,000     4 WEEKS     1,000 - 30,000     5 WEEKS     3,500 - 115,000   6-8 WEEKS     12,000 - 270,000    12 WEEKS     15,000 - 220,000        FEMALE AND NON-PREGNANT FEMALE:     LESS THAN 5 mIU/mL Performed at [facility], [place] [address], [place], [place] [address]   Type and screen [place]     Status: None   Collection Time: 03/29/22 11:09 PM  Result Value Ref Range   ABO/RH(D) O POS    Antibody Screen NEG    Sample Expiration      04/01/2022,2359 Performed at [facility], [place] [address], [place], [place] [address]   CBC     Status: Abnormal   Collection Time: 03/29/22 11:09 PM  Result Value Ref Range   WBC 5.3 4.0 - 10.5 K/uL   RBC 3.42 (L) 3.87 - 5.11 MIL/uL   Hemoglobin 10.6 (L) 12.0 - 15.0 g/dL   HCT 32.8 (L) 36.0 - 46.0 %   MCV 95.9 80.0 - 100.0 fL   MCH 31.0 26.0 - 34.0 pg   MCHC 32.3 30.0 - 36.0 g/dL   RDW 13.4 11.5 - 15.5 %   Platelets 284 150 - 400 K/uL   nRBC 0.0 0.0 - 0.2 %    Comment: Performed at [facility], [place] [address] [place], [place] [address]   US OB Transvaginal  Result Date: 03/29/2022 CLINICAL DATA:  Abdominal and pelvic pain, nausea and vomiting, pregnant EXAM: OBSTETRIC <14 WK [place] AND TRANSVAGINAL OB US TECHNIQUE: Both transabdominal and transvaginal ultrasound examinations were performed for complete evaluation of the gestation as well as the maternal uterus, adnexal regions, and pelvic cul-de-sac. Transvaginal technique was performed to assess early pregnancy. COMPARISON:  Same-day CT abdomen pelvis, 03/29/2022, pelvic ultrasound, 03/01/2022 FINDINGS: Intrauterine gestational sac: None Yolk sac:  Not Visualized. Embryo:  Not Visualized. Cardiac Activity: Not Visualized. Heart Rate: Visualized Subchorionic hemorrhage:  None visualized. Maternal uterus/adnexae: Uterus: 6.8 x 4.8 x 4.8 cm. Normal appearance of the uterus. Endometrial thickness 1.0 cm. Right  ovary: Not visualized. Left ovary: Left ovary measures 3.3 x 2.3 x 3.2 cm. Volume = 13 mL. Heterogeneously echogenic mass of the left adnexa adjacent the left ovary measuring 4.2 x 3.2 x 3.1 cm. Moderate volume of heterogeneous free fluid in the low pelvis. IMPRESSION: 1. Heterogeneously echogenic mass of the left adnexa adjacent to the left ovary measuring 4.2 cm, highly concerning for ruptured ectopic pregnancy given the presence of elevated beta hCG and findings of both CT and ultrasound. Note however that the patient had an  intrauterine pregnancy on ultrasound examination dated 03/01/2022, with recent report of surgical abortion. No imaging evidence of heterotopic pregnancy on prior examination. 2. Moderate volume of heterogeneous free fluid in the low pelvis, concerning for hemoperitoneum. 3.  No candidate intrauterine pregnancy. 4.  Right ovary not visualized by ultrasound. These results were called by telephone at the time of interpretation on 03/29/2022 at 8:01 pm to Dr. Carmin Muskrat , who verbally acknowledged these results. Electronically Signed   By: Delanna Ahmadi M.D.   On: 03/29/2022 20:17   US OB Comp Less 14 Wks  Result Date: 03/29/2022 CLINICAL DATA:  Abdominal and pelvic pain, nausea and vomiting, pregnant EXAM: OBSTETRIC <14 WK [place] AND TRANSVAGINAL OB US TECHNIQUE: Both transabdominal and transvaginal ultrasound examinations were performed for complete evaluation of the gestation as well as the maternal uterus, adnexal regions, and pelvic cul-de-sac. Transvaginal technique was performed to assess early pregnancy. COMPARISON:  Same-day CT abdomen pelvis, 03/29/2022, pelvic ultrasound, 03/01/2022 FINDINGS: Intrauterine gestational sac: None Yolk sac:  Not Visualized. Embryo:  Not Visualized. Cardiac Activity: Not Visualized. Heart Rate: Visualized Subchorionic hemorrhage:  None visualized. Maternal uterus/adnexae: Uterus: 6.8 x 4.8 x 4.8 cm. Normal appearance of the uterus. Endometrial thickness  1.0 cm. Right ovary: Not visualized. Left ovary: Left ovary measures 3.3 x 2.3 x 3.2 cm. Volume = 13 mL. Heterogeneously echogenic mass of the left adnexa adjacent the left ovary measuring 4.2 x 3.2 x 3.1 cm. Moderate volume of heterogeneous free fluid in the low pelvis. IMPRESSION: 1. Heterogeneously echogenic mass of the left adnexa adjacent to the left ovary measuring 4.2 cm, highly concerning for ruptured ectopic pregnancy given the presence of elevated beta hCG and findings of both CT and ultrasound. Note however that the patient had an intrauterine pregnancy on ultrasound examination dated 03/01/2022, with recent report of surgical abortion. No imaging evidence of heterotopic pregnancy on prior examination. 2. Moderate volume of heterogeneous free fluid in the low pelvis, concerning for hemoperitoneum. 3.  No candidate intrauterine pregnancy. 4.  Right ovary not visualized by ultrasound. These results were called by telephone at the time of interpretation on 03/29/2022 at 8:01 pm to Dr. Carmin Muskrat , who verbally acknowledged these results. Electronically Signed   By: Delanna Ahmadi M.D.   On: 03/29/2022 20:17   CT ABDOMEN PELVIS W CONTRAST  Result Date: 03/29/2022 CLINICAL DATA:  Abdomen pain EXAM: CT ABDOMEN AND PELVIS WITH CONTRAST TECHNIQUE: Multidetector CT imaging of the abdomen and pelvis was performed using the standard protocol following bolus administration of intravenous contrast. RADIATION DOSE REDUCTION: This exam was performed according to the departmental dose-optimization program which includes automated exposure control, adjustment of the mA and/or kV according to patient size and/or use of iterative reconstruction technique. CONTRAST:  26mL ISOVUE-370 IOPAMIDOL (ISOVUE-370) INJECTION 76% COMPARISON:  CT 01/21/2019 FINDINGS: Lower chest: Lung bases demonstrate no acute airspace disease. Hepatobiliary: No focal liver abnormality is seen. No gallstones, gallbladder wall thickening, or  biliary dilatation. Pancreas: Unremarkable. No pancreatic ductal dilatation or surrounding inflammatory changes. Spleen: Normal in size without focal abnormality. Adrenals/Urinary Tract: Adrenal glands are unremarkable. Kidneys are normal, without renal calculi, focal lesion, or hydronephrosis. Bladder is unremarkable. Stomach/Bowel: Stomach is within normal limits. Appendix appears normal. No evidence of bowel wall thickening, distention, or inflammatory changes. Vascular/Lymphatic: No significant vascular findings are present. No enlarged abdominal or pelvic lymph nodes. Reproductive: Uterus is unremarkable. Interim development of a complex mass at the left adnexa measuring 5.2 by 4.7 cm. Serpiginous hyperdensities in the  region of the complex mass suspected to represent vascular hyperemia though small foci of extravasation not entirely excluded. Other: Moderate volume fluid in the pelvis extending up to the liver and spleen with increased density values consistent with hemoperitoneum Musculoskeletal: No acute or significant osseous findings. IMPRESSION: 1. Complex heterogenous left adnexal mass measuring up to 5.2 cm with areas of serpiginous hyperdensity probably reflecting vascular hyperemia with small foci of extravasation difficult to exclude. Moderate volume hemorrhagic fluid in the pelvis with small volume hemorrhagic fluid in the perihepatic and perisplenic regions. Differential considerations include hemorrhagic ovarian cyst with bleeding, hemo salpinx, and given history of elevated HCG, ectopic pregnancy. Recommend OB consultation and consider correlation with pelvic ultrasound Critical Value/emergent results were called by telephone at the time of interpretation on 03/29/2022 at 6:08 pm to provider Dr. Vanita Panda, Who verbally acknowledged these results. Electronically Signed   By: Donavan Foil M.D.   On: 03/29/2022 18:09    Pending Labs Unresulted Labs (From admission, onward)     Start     Ordered    03/30/22 0500  CBC  Once,   R        03/30/22 0100   03/30/22 0500  hCG, quantitative, pregnancy  Once,   R        03/30/22 0100   03/30/22 0102  Rapid HIV screen (HIV 1/2 Ab+Ag)  Add-on,   AD        03/30/22 0101   03/30/22 0102  RPR  Add-on,   AD        03/30/22 0101            Vitals/Pain Today's Vitals   03/29/22 2257 03/29/22 2330 03/30/22 0000 03/30/22 0106  BP: 95/62 96/65 101/86   Pulse: 82 65 74   Resp: 16  14   Temp: 98 F (36.7 C)     TempSrc:      SpO2: 100% 100% 100%   Weight:      Height:      PainSc:    8     Isolation Precautions No active isolations  Medications Medications  ondansetron (ZOFRAN-ODT) disintegrating tablet 4 mg (has no administration in time range)  metoCLOPramide (REGLAN) injection 10 mg (has no administration in time range)  pantoprazole (PROTONIX) injection 40 mg (has no administration in time range)  0.9 % NaCl with KCl 40 mEq / L  infusion (has no administration in time range)  oxyCODONE (Oxy IR/ROXICODONE) immediate release tablet 5-10 mg (has no administration in time range)  HYDROmorphone (DILAUDID) injection 0.5 mg (has no administration in time range)  ondansetron (ZOFRAN) injection 4 mg (4 mg Intravenous Given 03/29/22 1745)  diphenhydrAMINE (BENADRYL) injection 25 mg (25 mg Intravenous Given 03/29/22 1745)  fentaNYL (SUBLIMAZE) injection 50 mcg (50 mcg Intravenous Given 03/29/22 1745)  iopamidol (ISOVUE-370) 76 % injection 75 mL (75 mLs Intravenous Contrast Given 03/29/22 1738)  metoCLOPramide (REGLAN) injection 10 mg (10 mg Intravenous Given 03/29/22 2035)  lactated ringers bolus 1,000 mL (1,000 mLs Intravenous New Bag/Given 03/29/22 2042)  morphine (PF) 2 MG/ML injection 2 mg (2 mg Intravenous Given 03/29/22 2035)  ondansetron (ZOFRAN) injection 4 mg (4 mg Intravenous Given 03/30/22 0014)  fentaNYL (SUBLIMAZE) injection 50 mcg (50 mcg Intravenous Given 03/30/22 0014)  prochlorperazine (COMPAZINE) injection 10 mg (10 mg  Intravenous Given 03/30/22 0058)  diphenhydrAMINE (BENADRYL) injection 25 mg (25 mg Intravenous Given 03/30/22 0057)    Mobility walks     Focused Assessments Ongoing lower abdominal/pelvic pain with nausea.  R Recommendations: See Admitting Provider Note  Report given to: Alexandra RN (0200)  Additional Notes:

## 2022-03-30 NOTE — Plan of Care (Signed)
  Problem: Education: Goal: Knowledge of disease or condition will improve Outcome: Progressing   Problem: Education: Goal: Knowledge of the prescribed therapeutic regimen will improve Outcome: Progressing   Problem: Clinical Measurements: Goal: Will remain free from infection Outcome: Progressing   Problem: Nutrition: Goal: Adequate nutrition will be maintained Outcome: Progressing

## 2022-03-30 NOTE — Progress Notes (Signed)
Gynecology Progress Note  Admission Date: 03/29/2022 Current Date: 03/30/2022 7:24 AM  Krista Stout is a 32 y.o. G3P2010 HD#2 admitted for abdominal pain, acute on chronic n/v, left hemorrhagic cyst with hemopeitoneum   History complicated by: Patient Active Problem List   Diagnosis Date Noted   Hemorrhagic cyst of ovary 03/30/2022   Marijuana abuse 03/30/2022   Hyperemesis gravidarum 03/01/2022   Asthma with status asthmaticus    Pelvic mass 06/19/2018    ROS and patient/family/surgical history, located on admission H&P note dated 03/29/2022, have been reviewed, and there are no changes except as noted below Yesterday/Overnight Events:  See h&p  Subjective:  S/s stable with continued pain and nausea and vomiting.   Objective:    Current Vital Signs 24h Vital Sign Ranges  T 98 F (36.7 C) Temp  Avg: 98.3 F (36.8 C)  Min: 98 F (36.7 C)  Max: 98.7 F (37.1 C)  BP 99/64 BP  Min: 95/62  Max: 133/107  HR 70 Pulse  Avg: 77.7  Min: 65  Max: 86  RR 16 Resp  Avg: 16.4  Min: 14  Max: 18  SaO2 100 % Room Air SpO2  Avg: 100 %  Min: 100 %  Max: 100 %       24 Hour I/O Current Shift I/O  Time Ins Outs 03/15 0701 - 03/16 0700 In: [id number] [I.V.:511] Out: -  No intake/output data recorded.   Patient Vitals for the past 12 hrs:  BP Temp Temp src Pulse Resp SpO2  03/30/22 0547 99/64 98 F (36.7 C) Oral 70 -- 100 %  03/30/22 0219 100/68 98.3 F (36.8 C) Oral 75 -- 100 %  03/30/22 0100 98/62 98.2 F (36.8 C) Oral 80 16 100 %  03/30/22 0000 101/86 -- -- 74 14 100 %  03/29/22 2330 96/65 -- -- 65 -- 100 %  03/29/22 2257 95/62 98 F (36.7 C) -- 82 16 100 %  03/29/22 2016 97/70 -- -- 76 -- --  03/29/22 2015 97/70 -- -- 80 18 100 %  03/29/22 2000 100/62 -- -- 74 16 100 %   Physical exam: General appearance: alert, cooperative, and appears stated age Abdomen:  minimally ttp, no peritoneal s/s, rare BS, mildly distended and tense (unchanged) Lungs: clear to auscultation  bilaterally Heart: S1, S2 normal, no murmur, rub or gallop, regular rate and rhythm Extremities: no lower extremity edema Skin: warm and dry Psych: appropriate Neurologic: Grossly normal  Medications Current Facility-Administered Medications  Medication Dose Route Frequency Provider Last Rate Last Admin   0.9 % NaCl with KCl 40 mEq / L  infusion   Intravenous Continuous Aletha Halim, MD 125 mL/hr at 03/30/22 0656 Infusion Verify at 03/30/22 0656   diphenhydrAMINE (BENADRYL) injection 12.5 mg  12.5 mg Intravenous Q6H PRN Aletha Halim, MD       HYDROmorphone (DILAUDID) injection 0.5-1 mg  0.5-1 mg Intravenous Q2H PRN Aletha Halim, MD       metoCLOPramide (REGLAN) injection 10 mg  10 mg Intravenous Q6H Aletha Halim, MD       ondansetron (ZOFRAN) injection 4 mg  4 mg Intravenous Q6H PRN Aletha Halim, MD       ondansetron (ZOFRAN-ODT) disintegrating tablet 4 mg  4 mg Oral Q8H Lani Mendiola, MD       oxyCODONE (Oxy IR/ROXICODONE) immediate release tablet 5-10 mg  5-10 mg Oral Q6H PRN Aletha Halim, MD          Labs  Recent Labs  Lab  03/29/22 1336 03/29/22 2309 03/30/22 0240  WBC 7.2 5.3 4.7  HGB 11.9* 10.6* 9.9*  HCT 36.1 32.8* 30.4*  PLT 332 284 286    Recent Labs  Lab 03/29/22 1336  NA 136  K 3.4*  CL 102  CO2 25  BUN 5*  CREATININE 0.76  CALCIUM 8.4*  PROT 6.6  BILITOT 0.5  ALKPHOS 72  ALT 21  AST 23  GLUCOSE 111*    Radiology No new imaging  Assessment & Plan:  Pt stable *GYN: repeat cbc this morning and at noon. I d/w her that given imaging findings I epect her hgb to settle around 7-8. I d/w her that reason for surgery would be if it seems that she has continued bleeding. I also d/w her re: indications, r/b for blood transfusion which she is amenable with PRN *Pain: IV PRNs *FEN/GI: MIVF and IV anti-emetics scheduled; she states she does not have a GI doc *PPx: SCDs *Dispo: hopefully tomorrow  Code Status: Full Code  Total time  taking care of the patient was 15 minutes, with greater than 50% of the time spent in face to face interaction with the patient.  Durene Romans MD Attending Center for [facility])

## 2022-03-30 NOTE — H&P (Signed)
Obstetrics & Gynecology H&P   Date of Admission: 03/30/2022   Requesting Provider: [facility]  Primary OBGYN: None Primary Care Provider: Pcp, No  Reason for Admission: abdominal pain, hemoperitoneum with left adnexal mass, cyclic nausea vomiting  History of Present Illness: Krista Stout is a 32 y.o. G3P2010 (Patient's last menstrual period was 01/28/2022 (exact date).), with the above CC. PMHx is significant for h/o laparoscopic myomectomy and right oophorectomy, hernia repair, cyclic nausea and vomiting, recent surgical abortion at 7 weeks, THC usage,   Patient surgical abortion in [place] on 2/28. Prior to this she was in the hospital from 2/21 to 28 with acute on chronic nausea and vomiting necessitating PICC line and TPN. She states that she started having unprotected intercourse one week after the abortion. She came to the ED today because of nausea and vomiting and unable to keep down home meds along with abdominal pain. No obvious period but she did start spotting today.    ED work up showed [place] and CT with hemoperitoneum and 5x5cm left adnexal complex mass with ?small extravasation. U/s showed normal uterus, ES of 63mm and 4cm left adnexal mass next to ovary.   I was called and recommend repeat quant and CBC. Hgb went to 10.6 from 11.9 over 10 hours and quant to 630 from 715; she was 28k 11 days prior to her abortion.  ROS: A 12-point review of systems was performed and negative, except as stated in the above HPI.  OBGYN History: As per HPI. OB History  Gravida Para Term Preterm AB Living  3 2 2   1     SAB IAB Ectopic Multiple Live Births    1          # Outcome Date GA Lbr Len/2nd Weight Sex Delivery Anes PTL Lv  3 IAB           2 Term      Vag-Spont     1 Term      Vag-Spont      Past Medical History: Past Medical History:  Diagnosis Date   Asthma    Cyclical vomiting syndrome    History of feeding tube     Past Surgical History: Past Surgical History:   Procedure Laterality Date   HERNIA REPAIR     INDUCED ABORTION  03/12/2022   LAPAROSCOPIC OOPHERECTOMY Right 2022   In [place]. Patient states fibroid had right ovary encased and both were removed; unsure if tube was removed, too    Family History:  History reviewed. No pertinent family history.  Social History:  Social History   Socioeconomic History   Marital status: Single    Spouse name: Not on file   Number of children: Not on file   Years of education: Not on file   Highest education level: Not on file  Occupational History   Not on file  Tobacco Use   Smoking status: Former   Smokeless tobacco: Never  Vaping Use   Vaping Use: Never used  Substance and Sexual Activity   Alcohol use: No   Drug use: No    Types: Marijuana    Comment: former   Sexual activity: Yes    Birth control/protection: None  Other Topics Concern   Not on file  Social History Narrative   Not on file   Social Determinants of Health   Financial Resource Strain: Not on file  Food Insecurity: Food Insecurity Present (03/13/2022)   Hunger Vital Sign  Worried About Charity fundraiser in the Last Year: Never true    [place] in the Last Year: Sometimes true  Transportation Needs: Unmet Transportation Needs (03/13/2022)   PRAPARE - Hydrologist (Medical): No    Lack of Transportation (Non-Medical): Yes  Physical Activity: Not on file  Stress: Not on file  Social Connections: Not on file  Intimate Partner Violence: Not At Risk (03/13/2022)   Humiliation, Afraid, Rape, and Kick questionnaire    Fear of Current or Ex-Partner: No    Emotionally Abused: No    Physically Abused: No    Sexually Abused: No    Allergy: No Known Allergies  Current Outpatient Medications: (Not in a hospital admission)    Hospital Medications: Current Facility-Administered Medications  Medication Dose Route Frequency Provider Last Rate Last Admin   0.9 % NaCl with KCl  40 mEq / L  infusion   Intravenous Continuous Aletha Halim, MD       HYDROmorphone (DILAUDID) injection 0.5 mg  0.5 mg Intravenous Q2H PRN Aletha Halim, MD       metoCLOPramide (REGLAN) injection 10 mg  10 mg Intravenous Q6H Aletha Halim, MD       ondansetron (ZOFRAN-ODT) disintegrating tablet 4 mg  4 mg Oral Q8H Maui Britten, Eduard Clos, MD       oxyCODONE (Oxy IR/ROXICODONE) immediate release tablet 5-10 mg  5-10 mg Oral Q6H PRN Aletha Halim, MD       pantoprazole (PROTONIX) injection 40 mg  40 mg Intravenous Once Aletha Halim, MD       Current Outpatient Medications  Medication Sig Dispense Refill   metoCLOPramide (REGLAN) 10 MG tablet Take 1 tablet (10 mg total) by mouth every 6 (six) hours. 30 tablet 0   ondansetron (ZOFRAN-ODT) 8 MG disintegrating tablet Take 1 tablet (8 mg total) by mouth every 8 (eight) hours as needed for nausea or vomiting. 20 tablet 0   promethazine (PHENERGAN) 25 MG suppository Place 1 suppository (25 mg total) rectally every 6 (six) hours as needed for nausea or vomiting. 12 each 0   scopolamine (TRANSDERM-SCOP) 1 MG/3DAYS Place 1 patch (1.5 mg total) onto the skin every 3 (three) days. 10 patch 12     Physical Exam: Vitals:   03/29/22 2016 03/29/22 2257 03/29/22 2330 03/30/22 0000  BP: 97/70 95/62 96/65  101/86  Pulse: 76 82 65 74  Resp:  16  14  Temp:  98 F (36.7 C)    TempSrc:      SpO2:  100% 100% 100%  Weight:      Height:        Temp:  [98 F (36.7 C)-98.7 F (37.1 C)] 98 F (36.7 C) (03/15 2257) Pulse Rate:  [65-86] 74 (03/16 0000) Resp:  [14-18] 14 (03/16 0000) BP: (95-133)/(62-107) 101/86 (03/16 0000) SpO2:  [100 %] 100 % (03/16 0000) Weight:  [67.1 kg] 67.1 kg (03/15 1321) No intake/output data recorded. No intake/output data recorded. No intake or output data in the 24 hours ending 03/30/22 0125   Current Vital Signs 24h Vital Sign Ranges  T 98 F (36.7 C) Temp  Avg: 98.4 F (36.9 C)  Min: 98 F (36.7 C)  Max: 98.7 F  (37.1 C)  BP 101/86 BP  Min: 95/62  Max: 133/107  HR 74 Pulse  Avg: 78.6  Min: 65  Max: 86  RR 14 Resp  Avg: 16.4  Min: 14  Max: 18  SaO2 100 % Room Air  SpO2  Avg: 100 %  Min: 100 %  Max: 100 %       24 Hour I/O Current Shift I/O  Time Ins Outs No intake/output data recorded. No intake/output data recorded.   Patient Vitals for the past 24 hrs:  BP Temp Temp src Pulse Resp SpO2 Height Weight  03/30/22 0000 101/86 -- -- 74 14 100 % -- --  03/29/22 2330 96/65 -- -- 65 -- 100 % -- --  03/29/22 2257 95/62 98 F (36.7 C) -- 82 16 100 % -- --  03/29/22 2016 97/70 -- -- 76 -- -- -- --  03/29/22 2015 97/70 -- -- 80 18 100 % -- --  03/29/22 2000 100/62 -- -- 74 16 100 % -- --  03/29/22 1906 -- 98.7 F (37.1 C) Oral -- -- -- -- --  03/29/22 1713 -- 98.7 F (37.1 C) Oral -- -- -- -- --  03/29/22 1700 106/74 -- -- 85 16 100 % -- --  03/29/22 1656 116/69 -- -- 85 18 100 % -- --  03/29/22 1321 -- -- -- -- -- -- 5\' 2"  (1.575 m) 67.1 kg  03/29/22 1312 (!) 133/107 98 F (36.7 C) Oral 86 17 100 % -- --    Body mass index is 27.07 kg/m. General appearance: Well nourished, well developed female in no acute distress.   Cardiovascular: S1, S2 normal, no murmur, rub or gallop, regular rate and rhythm Respiratory:  Clear to auscultation bilateral. Normal respiratory effort Abdomen: no peritoneal s/s, minimally ttp, mildly distended Neuro/Psych:  Normal mood and affect.  Skin:  Warm and dry.  Extremities: no clubbing, cyanosis, or edema.   Laboratory: As per HPI  Recent Labs  Lab 03/29/22 1336  NA 136  K 3.4*  CL 102  CO2 25  BUN 5*  CREATININE 0.76  CALCIUM 8.4*  PROT 6.6  BILITOT 0.5  ALKPHOS 72  ALT 21  AST 23  GLUCOSE 111*   No results for input(s): "APTT", "INR", "PTT" in the last 168 hours.  Invalid input(s): "DRHAPTT" Recent Labs  Lab 03/29/22 2309  [place] [facility]    Imaging:  Narrative & Impression  CLINICAL DATA:  Abdominal and pelvic pain, nausea and  vomiting, pregnant   EXAM: OBSTETRIC <14 WK [place] AND TRANSVAGINAL OB US   TECHNIQUE: Both transabdominal and transvaginal ultrasound examinations were performed for complete evaluation of the gestation as well as the maternal uterus, adnexal regions, and pelvic cul-de-sac. Transvaginal technique was performed to assess early pregnancy.   COMPARISON:  Same-day CT abdomen pelvis, 03/29/2022, pelvic ultrasound, 03/01/2022   FINDINGS: Intrauterine gestational sac: None   Yolk sac:  Not Visualized.   Embryo:  Not Visualized.   Cardiac Activity: Not Visualized.   Heart Rate: Visualized   Subchorionic hemorrhage:  None visualized.   Maternal uterus/adnexae:   Uterus: 6.8 x 4.8 x 4.8 cm. Normal appearance of the uterus. Endometrial thickness 1.0 cm.   Right ovary: Not visualized.   Left ovary: Left ovary measures 3.3 x 2.3 x 3.2 cm. Volume = 13 mL. Heterogeneously echogenic mass of the left adnexa adjacent the left ovary measuring 4.2 x 3.2 x 3.1 cm.   Moderate volume of heterogeneous free fluid in the low pelvis.   IMPRESSION: 1. Heterogeneously echogenic mass of the left adnexa adjacent to the left ovary measuring 4.2 cm, highly concerning for ruptured ectopic pregnancy given the presence of elevated beta hCG and findings of both CT and ultrasound. Note however that the  patient had an intrauterine pregnancy on ultrasound examination dated 03/01/2022, with recent report of surgical abortion. No imaging evidence of heterotopic pregnancy on prior examination.   2. Moderate volume of heterogeneous free fluid in the low pelvis, concerning for hemoperitoneum.   3.  No candidate intrauterine pregnancy.   4.  Right ovary not visualized by ultrasound.   These results were called by telephone at the time of interpretation on 03/29/2022 at 8:01 pm to Dr. Carmin Muskrat , who verbally acknowledged these results.     Electronically Signed   By: Delanna Ahmadi M.D.   On:  03/29/2022 20:17     Narrative & Impression  CLINICAL DATA:  Abdomen pain   EXAM: CT ABDOMEN AND PELVIS WITH CONTRAST   TECHNIQUE: Multidetector CT imaging of the abdomen and pelvis was performed using the standard protocol following bolus administration of intravenous contrast.   RADIATION DOSE REDUCTION: This exam was performed according to the departmental dose-optimization program which includes automated exposure control, adjustment of the mA and/or kV according to patient size and/or use of iterative reconstruction technique.   CONTRAST:  60mL ISOVUE-370 IOPAMIDOL (ISOVUE-370) INJECTION 76%   COMPARISON:  CT 01/21/2019   FINDINGS: Lower chest: Lung bases demonstrate no acute airspace disease.   Hepatobiliary: No focal liver abnormality is seen. No gallstones, gallbladder wall thickening, or biliary dilatation.   Pancreas: Unremarkable. No pancreatic ductal dilatation or surrounding inflammatory changes.   Spleen: Normal in size without focal abnormality.   Adrenals/Urinary Tract: Adrenal glands are unremarkable. Kidneys are normal, without renal calculi, focal lesion, or hydronephrosis. Bladder is unremarkable.   Stomach/Bowel: Stomach is within normal limits. Appendix appears normal. No evidence of bowel wall thickening, distention, or inflammatory changes.   Vascular/Lymphatic: No significant vascular findings are present. No enlarged abdominal or pelvic lymph nodes.   Reproductive: Uterus is unremarkable. Interim development of a complex mass at the left adnexa measuring 5.2 by 4.7 cm. Serpiginous hyperdensities in the region of the complex mass suspected to represent vascular hyperemia though small foci of extravasation not entirely excluded.   Other: Moderate volume fluid in the pelvis extending up to the liver and spleen with increased density values consistent with hemoperitoneum   Musculoskeletal: No acute or significant osseous findings.    IMPRESSION: 1. Complex heterogenous left adnexal mass measuring up to 5.2 cm with areas of serpiginous hyperdensity probably reflecting vascular hyperemia with small foci of extravasation difficult to exclude. Moderate volume hemorrhagic fluid in the pelvis with small volume hemorrhagic fluid in the perihepatic and perisplenic regions. Differential considerations include hemorrhagic ovarian cyst with bleeding, hemo salpinx, and given history of elevated HCG, ectopic pregnancy. Recommend OB consultation and consider correlation with pelvic ultrasound   Critical Value/emergent results were called by telephone at the time of interpretation on 03/29/2022 at 6:08 pm to provider Dr. Vanita Panda, Who verbally acknowledged these results.     Electronically Signed   By: Donavan Foil M.D.   On: 03/29/2022 18:09      Assessment: Krista Stout is a 31 y.o. with abdominal pain, hemoperitoneum and left adnexal mass; pt stable  Plan: Prior BPs in 90s-100s/70s so patient at her baseline and not tachycardic and pt is well appearing. I can't imagine that this would be an ectopic pregnancy and more likely a hemorrhagic cyst or potentially another fibroid. She was seen in 2021 at [facility] and had a pelvic mass but she states that this was removed in [place] in late 2022 via laparoscopically. She states that she  had a fibroid and her right ovary was stuck to it so her right ovary and the fibroid was removed.  At her last pregnancy u/s with [place] on 2/16 her adnexa was normal. Will admit and keep NPO and repeat bloodwork at 5am *FEN/GI: NS with KCL, NPO and sips with meds *PPx: PPI, SCDs *Pain: PO with IV for breakthrough *Dispo: potentially later today  Total time taking care of the patient was 45 minutes, with greater than 50% of the time spent in face to face interaction with the patient.  Durene Romans MD Attending Center for [place] ([facility]) GYN Consult Phone: [phone] (M-F,  0800-1700) & [phone] (Off hours, weekends, holidays)

## 2022-03-31 DIAGNOSIS — N83209 Unspecified ovarian cyst, unspecified side: Secondary | ICD-10-CM

## 2022-03-31 LAB — COMPREHENSIVE METABOLIC PANEL
ALT: 19 U/L (ref 0–44)
AST: 17 U/L (ref 15–41)
Albumin: 3 g/dL — ABNORMAL LOW (ref 3.5–5.0)
Alkaline Phosphatase: 58 U/L (ref 38–126)
Anion gap: 9 (ref 5–15)
BUN: <5 mg/dL — ABNORMAL LOW (ref 6–20)
CO2: 21 mmol/L — ABNORMAL LOW (ref 22–32)
Calcium: 8.2 mg/dL — ABNORMAL LOW (ref 8.9–10.3)
Chloride: 105 mmol/L (ref 98–111)
Creatinine, Ser: 0.74 mg/dL (ref 0.44–1.00)
GFR, Estimated: >60 mL/min (ref >60)
Glucose, Bld: 87 mg/dL (ref 70–99)
Potassium: 4.1 mmol/L (ref 3.5–5.1)
Sodium: 135 mmol/L (ref 135–145)
Total Bilirubin: 0.6 mg/dL (ref 0.3–1.2)
Total Protein: 5.8 g/dL — ABNORMAL LOW (ref 6.5–8.1)

## 2022-03-31 LAB — CBC
HCT: 29.9 % — ABNORMAL LOW (ref 36.0–46.0)
Hemoglobin: 9.6 g/dL — ABNORMAL LOW (ref 12.0–15.0)
MCH: 31.1 pg (ref 26.0–34.0)
MCHC: 32.1 g/dL (ref 30.0–36.0)
MCV: 96.8 fL (ref 80.0–100.0)
Platelets: 258 10*3/uL (ref 150–400)
RBC: 3.09 MIL/uL — ABNORMAL LOW (ref 3.87–5.11)
RDW: 13.4 % (ref 11.5–15.5)
WBC: 4.2 10*3/uL (ref 4.0–10.5)
nRBC: 0 % (ref 0.0–0.2)

## 2022-03-31 LAB — MAGNESIUM: Magnesium: 1.7 mg/dL (ref 1.7–2.4)

## 2022-03-31 MED ORDER — MAGNESIUM SULFATE 2 GM/50ML IV SOLN
2.0000 g | Freq: Once | INTRAVENOUS | Status: AC
  Administered 2022-03-31: 2 g via INTRAVENOUS
  Filled 2022-03-31: qty 50

## 2022-03-31 MED ORDER — PROMETHAZINE HCL 25 MG RE SUPP
25.0000 mg | Freq: Once | RECTAL | Status: AC
  Administered 2022-03-31: 25 mg via RECTAL
  Filled 2022-03-31: qty 1

## 2022-03-31 MED ORDER — PROMETHAZINE HCL 25 MG RE SUPP: 25.0000 mg | Freq: Four times a day (QID) | RECTAL | Status: DC | PRN

## 2022-03-31 MED ORDER — DEXTROSE IN LACTATED RINGERS 5 % IV SOLN: INTRAVENOUS | Status: DC

## 2022-03-31 MED ORDER — ENOXAPARIN SODIUM 40 MG/0.4ML IJ SOSY
40.0000 mg | PREFILLED_SYRINGE | INTRAMUSCULAR | Status: DC
  Administered 2022-03-31: 40 mg via SUBCUTANEOUS
  Filled 2022-03-31 (×4): qty 0.4

## 2022-03-31 NOTE — Progress Notes (Addendum)
Gynecology Progress Note  Admission Date: 03/29/2022 Current Date: 03/31/2022 7:22 AM  Krista Stout is a 32 y.o. G3P2010 HD#3 admitted for abdominal pain, acute on chronic n/v, left hemorrhagic cyst with hemopeitoneum   History complicated by: Patient Active Problem List   Diagnosis Date Noted   Hemorrhagic cyst of ovary 03/30/2022   Marijuana abuse 03/30/2022   Hyperemesis gravidarum 03/01/2022   Asthma with status asthmaticus    Pelvic mass 06/19/2018    ROS and patient/family/surgical history, located on admission H&P note dated 03/29/2022, have been reviewed, and there are no changes except as noted below Yesterday/Overnight Events:  none  Subjective:  S/s stable with stable pain and nausea and vomiting.   Objective:    Current Vital Signs 24h Vital Sign Ranges  T 98.2 F (36.8 C) Temp  Avg: 98.1 F (36.7 C)  Min: 97.9 F (36.6 C)  Max: 98.2 F (36.8 C)  BP (!) 97/59 BP  Min: 91/60  Max: 108/62  HR 85 Pulse  Avg: 79.7  Min: 76  Max: 85  RR 18 Resp  Avg: 17  Min: 16  Max: 18  SaO2 99 % Room Air SpO2  Avg: 99 %  Min: 98 %  Max: 100 %       24 Hour I/O Current Shift I/O  Time Ins Outs 03/16 0701 - 03/17 0700 In: 2378.5 [I.V.:2378.5] Out: -  No intake/output data recorded.   Physical exam: General appearance: alert, cooperative, and appears stated age Abdomen:  minimally ttp, no peritoneal s/s, rare BS, mildly distended and tense; overall exam improved from yesterday Lungs: clear to auscultation bilaterally Heart: S1, S2 normal, no murmur, rub or gallop, regular rate and rhythm Extremities: no lower extremity edema Skin: warm and dry Psych: appropriate Neurologic: Grossly normal  Medications Current Facility-Administered Medications  Medication Dose Route Frequency Provider Last Rate Last Admin   0.9 % NaCl with KCl 40 mEq / L  infusion   Intravenous Continuous Aletha Halim, MD 125 mL/hr at 03/31/22 0309 New Bag at 03/31/22 0309   diphenhydrAMINE  (BENADRYL) injection 12.5 mg  12.5 mg Intravenous Q6H PRN Aletha Halim, MD   12.5 mg at 03/31/22 0306   enoxaparin (LOVENOX) injection 40 mg  40 mg Subcutaneous Q24H Aletha Halim, MD       HYDROmorphone (DILAUDID) injection 0.5-1 mg  0.5-1 mg Intravenous Q2H PRN Aletha Halim, MD   1 mg at 03/31/22 0650   magnesium sulfate IVPB 2 g 50 mL  2 g Intravenous Once Aletha Halim, MD       metoCLOPramide (REGLAN) injection 10 mg  10 mg Intravenous Q6H Aletha Halim, MD   10 mg at 03/31/22 0152   ondansetron (ZOFRAN) injection 4 mg  4 mg Intravenous Q6H PRN Aletha Halim, MD   4 mg at 03/31/22 0650   ondansetron (ZOFRAN-ODT) disintegrating tablet 4 mg  4 mg Oral Q8H [name], Eduard Clos, MD       oxyCODONE (Oxy IR/ROXICODONE) immediate release tablet 5-10 mg  5-10 mg Oral Q6H PRN Aletha Halim, MD       promethazine (PHENERGAN) suppository 25 mg  25 mg Rectal Once Aletha Halim, MD       promethazine (PHENERGAN) suppository 25 mg  25 mg Rectal Q6H PRN Aletha Halim, MD       Labs  Recent Labs  Lab 03/30/22 0654 03/30/22 1138 03/31/22 0310  WBC 4.0 3.9* 4.2  HGB 10.1* 9.7* 9.6*  HCT 31.7* 28.4* 29.9*  PLT 273 258 258  Recent Labs  Lab 03/29/22 1336 03/31/22 0310  NA 136 135  K 3.4* 4.1  CL 102 105  CO2 25 21*  BUN 5* <5*  CREATININE 0.76 0.74  CALCIUM 8.4* 8.2*  PROT 6.6 5.8*  BILITOT 0.5 0.6  ALKPHOS 72 58  ALT 21 19  AST 23 17  GLUCOSE 111* 87     Radiology No new imaging  Assessment & Plan:  Pt stable *GYN: stable h/h this morning. D/w her that pain and n/v will take weeks to resolve due to amount of hemoperitoneum so goal is pain control and ability to take PO *Pain: IV PRNs *FEN/GI: MIVF (will switch to d5lr) and IV anti-emetics scheduled; pt states phenergan suppositories helped in the past so will try that. Has been on clears with minimal intake but I told her to avoid carbonated beverages, drinking through a straw and eating ice.  -she  states she does not have a GI doc *PPx: SCDs (pt not wearing, so will start lovenox *Dispo: when able to take PO  Code Status: Full Code  Total time taking care of the patient was 15 minutes, with greater than 50% of the time spent in face to face interaction with the patient.  Durene Romans MD Attending Center for [facility])

## 2022-03-31 NOTE — Progress Notes (Signed)
Pt attempted to drink clear liquids. Began vomiting shortly after.

## 2022-03-31 NOTE — Plan of Care (Signed)
  Problem: Education: Goal: Knowledge of disease or condition will improve Outcome: Progressing Goal: Knowledge of the prescribed therapeutic regimen will improve Outcome: Progressing Goal: Individualized Educational Video(s) Outcome: Progressing   Problem: Clinical Measurements: Goal: Complications related to the disease process, condition or treatment will be avoided or minimized Outcome: Progressing   Problem: Education: Goal: Knowledge of disease or condition will improve Outcome: Progressing

## 2022-04-01 ENCOUNTER — Encounter (HOSPITAL_COMMUNITY): Payer: Self-pay | Admitting: Obstetrics and Gynecology

## 2022-04-01 DIAGNOSIS — R112 Nausea with vomiting, unspecified: Secondary | ICD-10-CM | POA: Diagnosis not present

## 2022-04-01 DIAGNOSIS — F1220 Cannabis dependence, uncomplicated: Secondary | ICD-10-CM

## 2022-04-01 DIAGNOSIS — R1084 Generalized abdominal pain: Secondary | ICD-10-CM | POA: Diagnosis not present

## 2022-04-01 DIAGNOSIS — D62 Acute posthemorrhagic anemia: Secondary | ICD-10-CM

## 2022-04-01 DIAGNOSIS — N83209 Unspecified ovarian cyst, unspecified side: Secondary | ICD-10-CM

## 2022-04-01 DIAGNOSIS — N838 Other noninflammatory disorders of ovary, fallopian tube and broad ligament: Secondary | ICD-10-CM

## 2022-04-01 LAB — BASIC METABOLIC PANEL
Anion gap: 8 (ref 5–15)
BUN: <5 mg/dL — ABNORMAL LOW (ref 6–20)
CO2: 28 mmol/L (ref 22–32)
Calcium: 8.5 mg/dL — ABNORMAL LOW (ref 8.9–10.3)
Chloride: 101 mmol/L (ref 98–111)
Creatinine, Ser: 0.82 mg/dL (ref 0.44–1.00)
GFR, Estimated: >60 mL/min (ref >60)
Glucose, Bld: 100 mg/dL — ABNORMAL HIGH (ref 70–99)
Potassium: 3.5 mmol/L (ref 3.5–5.1)
Sodium: 137 mmol/L (ref 135–145)

## 2022-04-01 LAB — HCG, QUANTITATIVE, PREGNANCY: hCG, Beta Chain, Quant, S: 513 m[IU]/mL — ABNORMAL HIGH (ref <5)

## 2022-04-01 LAB — CERVICOVAGINAL ANCILLARY ONLY
Chlamydia: NEGATIVE
Comment: NEGATIVE
Comment: NEGATIVE
Comment: NORMAL
Neisseria Gonorrhea: NEGATIVE
Trichomonas: NEGATIVE

## 2022-04-01 MED ORDER — POTASSIUM CHLORIDE 2 MEQ/ML IV SOLN
INTRAVENOUS | Status: AC
  Filled 2022-04-01 (×5): qty 1000

## 2022-04-01 MED ORDER — PANTOPRAZOLE SODIUM 40 MG PO TBEC
40.0000 mg | DELAYED_RELEASE_TABLET | Freq: Every day | ORAL | Status: DC
  Administered 2022-04-01: 40 mg via ORAL
  Filled 2022-04-01 (×3): qty 1

## 2022-04-01 MED ORDER — PANTOPRAZOLE SODIUM 40 MG PO TBEC: 40.0000 mg | DELAYED_RELEASE_TABLET | Freq: Two times a day (BID) | ORAL | Status: DC

## 2022-04-01 MED ORDER — KCL IN DEXTROSE-NACL 30-5-0.45 MEQ/L-%-% IV SOLN: INTRAVENOUS | Status: DC

## 2022-04-01 MED ORDER — SCOPOLAMINE 1 MG/3DAYS TD PT72
1.0000 | MEDICATED_PATCH | TRANSDERMAL | Status: DC
  Administered 2022-04-01: 1.5 mg via TRANSDERMAL
  Filled 2022-04-01: qty 1

## 2022-04-01 MED ORDER — PROMETHAZINE HCL 25 MG RE SUPP
25.0000 mg | Freq: Four times a day (QID) | RECTAL | Status: DC
  Administered 2022-04-01 – 2022-04-04 (×11): 25 mg via RECTAL
  Filled 2022-04-01 (×15): qty 1

## 2022-04-01 MED ORDER — KCL IN DEXTROSE-NACL 30-5-0.45 MEQ/L-%-% IV SOLN
INTRAVENOUS | Status: DC
  Filled 2022-04-01: qty 1000

## 2022-04-01 NOTE — Progress Notes (Signed)
   04/01/22 1105  SDOH Interventions  Food Insecurity Interventions Intervention Not Indicated;Inpatient TOC   CSW screened pt at bedside for Food, Housing, and transportation insecurity. Pt recently moved here from [place] and states she has moved here with her fiancee. They have a house and no concerns for food or transportation. Pt working on switching her Medicaid to [place], but states she will keep her [place] PCP for now. No further needs or interventions needed.

## 2022-04-01 NOTE — Progress Notes (Signed)
   04/01/22 1000  Mobility  Activity Ambulated with assistance in hallway  Level of Assistance Modified independent, requires aide device or extra time  Assistive Device  (IV Pole)  Distance Ambulated (ft) 80 ft  Activity Response Tolerated fair  Mobility Referral Yes  $Mobility charge 1 Mobility   Mobility Specialist Progress Note  Pt in bed and agreeable. Had c/o abd pain w/ a 10/10 rating. Returned EOB w/ all needs met and call bell in reach.    Lucious Groves Mobility Specialist  Please contact via SecureChat or Rehab office at [phone]

## 2022-04-01 NOTE — Consult Note (Addendum)
Consultation  Referring Provider:   OB/GYN Primary Care Physician:  Pcp, No Primary Gastroenterologist:  Althia Forts       Reason for Consultation:   Acute on chronic nausea and vomiting in setting of hemorrhagic ovarian cyst   Impression    Intractable nausea and vomiting  in setting of hemorrhagic cyst of the ovary and cyclic nausea and vomiting syndrome CT abdomen pelvis without any evidence of gastric issues, normal esophagus normal pancreas normal intestines. Labs without leukocytosis, no elevation of BUN.  Normal electrolytes Previous EGD and GES 2022 for workup  Hemorrhagic cyst of ovary Elevated hCG Recent surgical abortion [facility] following  Acute blood loss anemia No GI blood loss, secondary to hemorrhagic cyst  Principal Problem:   Hemorrhagic cyst of ovary Active Problems:   Marijuana abuse    LOS: 2 days     Plan   Currently nausea and vomiting may be in response to pain/hemorrhagic cyst, from her previously diagnosed cyclic nausea vomiting syndrome, gastritis, less likely cannabinoid hyperemesis as patient does not chronically use..   States has not had any vomiting over the past 24 hours. Continue Zofran, Reglan and, Phenergan suppositories. Can add on scopolamine patch has had good response to that and her Benadryl in the past. Add on pantoprazole 40 mg daily for 2-4 weeks At this time I do not see any need for endoscopic evaluation, patient continues to have intractable nausea and vomiting can consider repeat EGD Can consider amitriptyline for preventative therapy outpatient Further conditions per Dr. Silverio Decamp  Thank you for your kind consultation, we will continue to follow.         HPI:   Krista Stout is a 32 y.o. female with past medical history significant for laparoscopic myomectomy, right nephrectomy, hernia repair, cyclic nausea and vomiting, surgical abortion 7 weeks prior, marijuana use.  Patiently recently moved from [place]  with fianc. Patient states she had been diagnosed with cyclic vomiting syndrome in 2020 at [facility], she initially had EGD, gastric emptying study that were negative. States she will go through cyclic episodes of nausea, gagging vomiting and abdominal pain from the vomiting. These episodes can last from days to weeks, she can also have months to close to a year at a time without any nausea and vomiting..  On 2 occasions she reports them being so bad she had to go to the hospital with feeding tube or TPN.  1 time was in 2021 and the other time in 2022 associated with pregnancy of her daughter. Should she has previously tried to stop gluten, seen an allergist without any help.  Most recently patient states this episode started February 13 with nausea and vomiting states was worse associate with pregnancy Patient had a surgical abortion 03/13/2022 in [place].   Patient was feeling better after surgical abortion until about 4 to 5 days ago she began to have nausea vomiting and abdominal pain that was very different from what she would normally have, states was his worst abdominal pain she is ever had in her life. Found to have likely hemorrhagic cyst of ovary. States she continues to have nausea and vomiting, states her normal pain response is to have nausea and vomiting.  With starting pain medication she states she is feeling better.  She states she has not had any vomiting in the past 24 hours and is feeling slightly better.   She does states she has had reflux this hospitalization which she has not  had in the past.  Denies dysphagia, odynophagia, epigastric pain. She is also been getting Reglan, Zofran twice daily and Phenergan suppositories feel like she is doing better.  She states she has had good response to Benadryl and scopolamine patches in the past.  Last bowel movement 4 days ago which is not normal for her.   Denies melena or hematochezia.  Patient denies NSAID use, rare social  alcohol, Patient does have history of marijuana use but states last use was a month ago, does not appear to be chronic.  Gets no relief with hot showers.  No drug use  Patient is on Dilaudid for breakthrough pain given 3 times a day and oxycodone which she has not had any getting Reglan 10 mg,  Zofran injection twice a day, scheduled as as needed, declines pill, oxycodone, and Phenergan suppositories.  03/29/2022 CT abdomen pelvis with contrast Complex heterogenous left adnexal mass measuring up to 5.2 cm with areas of serpiginous hyperdensity probably reflecting vascular hyperemia with small foci of extravasation difficult to exclude. Moderate volume hemorrhagic fluid in the pelvis with small volume hemorrhagic fluid in the perihepatic and perisplenic regions. Differential considerations include hemorrhagic ovarian cyst with bleeding, hemo salpinx, and given history of elevated HCG, ectopic pregnancy. Recommend OB consultation and consider correlation with pelvic ultrasound. Showed normal liver, no gallstones, normal biliary, normal pancreas, normal spleen, normal stomach.  No evidence of bowel wall thickening distention or inflammatory changes.  Patient had an elevated hCG 715 on 03/29/2022 currently today at 513 CBC on admission Hgb 11.9, currently 9.6.  No leukocytosis normal platelets Patient had no elevation of BUN, normal creatinine normal liver function.  Abnormal ED labs: Abnormal Labs Reviewed  CBC WITH DIFFERENTIAL/PLATELET - Abnormal; Notable for the following components:      Result Value   RBC 3.76 (*)    Hemoglobin 11.9 (*)    All other components within normal limits  COMPREHENSIVE METABOLIC PANEL - Abnormal; Notable for the following components:   Potassium 3.4 (*)    Glucose, Bld 111 (*)    BUN 5 (*)    Calcium 8.4 (*)    All other components within normal limits  HCG, QUANTITATIVE, PREGNANCY - Abnormal; Notable for the following components:   hCG, Beta Chain,  Quant, S 715 (*)    All other components within normal limits  URINALYSIS, ROUTINE W REFLEX MICROSCOPIC - Abnormal; Notable for the following components:   Hgb urine dipstick MODERATE (*)    Ketones, ur 20 (*)    Leukocytes,Ua TRACE (*)    Bacteria, UA RARE (*)    All other components within normal limits  HCG, QUANTITATIVE, PREGNANCY - Abnormal; Notable for the following components:   hCG, Beta Chain, Quant, S 630 (*)    All other components within normal limits  CBC - Abnormal; Notable for the following components:   RBC 3.42 (*)    Hemoglobin 10.6 (*)    HCT 32.8 (*)    All other components within normal limits  CBC - Abnormal; Notable for the following components:   RBC 3.17 (*)    Hemoglobin 9.9 (*)    HCT 30.4 (*)    All other components within normal limits  HCG, QUANTITATIVE, PREGNANCY - Abnormal; Notable for the following components:   hCG, Beta Chain, Quant, S 630 (*)    All other components within normal limits  CBC - Abnormal; Notable for the following components:   RBC 3.27 (*)    Hemoglobin 10.1 (*)  HCT 31.7 (*)    All other components within normal limits  HCG, QUANTITATIVE, PREGNANCY - Abnormal; Notable for the following components:   hCG, Beta Chain, Quant, S 593 (*)    All other components within normal limits  CBC - Abnormal; Notable for the following components:   WBC 3.9 (*)    RBC 3.01 (*)    Hemoglobin 9.7 (*)    HCT 28.4 (*)    All other components within normal limits  CBC - Abnormal; Notable for the following components:   RBC 3.09 (*)    Hemoglobin 9.6 (*)    HCT 29.9 (*)    All other components within normal limits  COMPREHENSIVE METABOLIC PANEL - Abnormal; Notable for the following components:   CO2 21 (*)    BUN <5 (*)    Calcium 8.2 (*)    Total Protein 5.8 (*)    Albumin 3.0 (*)    All other components within normal limits  BASIC METABOLIC PANEL - Abnormal; Notable for the following components:   Glucose, Bld 100 (*)    BUN <5 (*)     Calcium 8.5 (*)    All other components within normal limits  HCG, QUANTITATIVE, PREGNANCY - Abnormal; Notable for the following components:   hCG, Beta Chain, Quant, S 513 (*)    All other components within normal limits     Past Medical History:  Diagnosis Date   Asthma    Cyclical vomiting syndrome    History of feeding tube 2021   [facility] hospital in [place]    Surgical History:  She  has a past surgical history that includes Umbilical hernia repair; Laparoscopic oopherectomy (Right, 2022); and Induced abortion (03/12/2022). Family History:  Her family history is not on file. Social History:   reports that she has quit smoking. She has never used smokeless tobacco. She reports that she does not drink alcohol and does not use drugs.  Prior to Admission medications   Medication Sig Start Date End Date Taking? Authorizing Provider  metoCLOPramide (REGLAN) 10 MG tablet Take 1 tablet (10 mg total) by mouth every 6 (six) hours. 03/06/22  Yes Radene Gunning, MD  ondansetron (ZOFRAN-ODT) 8 MG disintegrating tablet Take 1 tablet (8 mg total) by mouth every 8 (eight) hours as needed for nausea or vomiting. 03/11/22  Yes Griffin Basil, MD  promethazine (PHENERGAN) 25 MG suppository Place 1 suppository (25 mg total) rectally every 6 (six) hours as needed for nausea or vomiting. 03/06/22  Yes Radene Gunning, MD  scopolamine (TRANSDERM-SCOP) 1 MG/3DAYS Place 1 patch (1.5 mg total) onto the skin every 3 (three) days. 03/07/22  Yes Radene Gunning, MD    Current Facility-Administered Medications  Medication Dose Route Frequency Provider Last Rate Last Admin   dextrose 5 % and 0.45% NaCl 1,000 mL with potassium chloride 30 mEq/L IV infusion   Intravenous Continuous Aletha Halim, MD 100 mL/hr at 04/01/22 0628 New Bag at 04/01/22 [id number]   diphenhydrAMINE (BENADRYL) injection 12.5 mg  12.5 mg Intravenous Q6H PRN Aletha Halim, MD   12.5 mg at 04/01/22 0626   enoxaparin (LOVENOX) injection 40  mg  40 mg Subcutaneous Q24H Aletha Halim, MD   40 mg at 03/31/22 1310   HYDROmorphone (DILAUDID) injection 0.5-1 mg  0.5-1 mg Intravenous Q2H PRN Aletha Halim, MD   1 mg at 04/01/22 1017   metoCLOPramide (REGLAN) injection 10 mg  10 mg Intravenous Q6H Aletha Halim, MD   10 mg at 04/01/22 [phone]  ondansetron (ZOFRAN) injection 4 mg  4 mg Intravenous Q6H PRN Aletha Halim, MD   4 mg at 04/01/22 0626   ondansetron (ZOFRAN-ODT) disintegrating tablet 4 mg  4 mg Oral Q8H Aletha Halim, MD       oxyCODONE (Oxy IR/ROXICODONE) immediate release tablet 5-10 mg  5-10 mg Oral Q6H PRN Aletha Halim, MD       promethazine (PHENERGAN) suppository 25 mg  25 mg Rectal Q6H Aletha Halim, MD   25 mg at 04/01/22 [id number]    Allergies as of 03/29/2022   (No Known Allergies)    Review of Systems:    Constitutional: No weight loss, fever, chills, weakness or fatigue HEENT: Eyes: No change in vision               Ears, Nose, Throat:  No change in hearing or congestion Skin: No rash or itching Cardiovascular: No chest pain, chest pressure or palpitations   Respiratory: No SOB or cough Gastrointestinal: See HPI and otherwise negative Genitourinary: No dysuria or change in urinary frequency Neurological: No headache, dizziness or syncope Musculoskeletal: No new muscle or joint pain Hematologic: No bleeding or bruising Psychiatric: No history of depression or anxiety     Physical Exam:  Vital signs in last 24 hours: Temp:  [98.2 F (36.8 C)-98.9 F (37.2 C)] 98.4 F (36.9 C) (03/18 0910) Pulse Rate:  [62-74] 64 (03/18 0910) Resp:  [17-20] 20 (03/18 0910) BP: (95-105)/(59-69) 95/59 (03/18 0910) SpO2:  [99 %-100 %] 100 % (03/18 0910) Last BM Date : 03/28/22 Last BM recorded by nurses in past 5 days No data recorded  General:   Pleasant, well developed female in no acute distress Head:  Normocephalic and atraumatic. Eyes: sclerae anicteric,conjunctive pink  Heart:  regular rate and  rhythm Pulm: Clear anteriorly; no wheezing Abdomen:  Soft, Non-distended AB, Active bowel sounds. Mild to moderate tenderness in the entire abdomen. With guarding and Without rebound, No organomegaly appreciated. Extremities:  Without edema. Msk:  Symmetrical without gross deformities. Peripheral pulses intact.  Neurologic:  Alert and  oriented x4;  No focal deficits.  Skin:   Dry and intact without significant lesions or rashes. Psychiatric:  Cooperative. Normal mood and affect.  LAB RESULTS: Recent Labs    03/30/22 0654 03/30/22 1138 03/31/22 0310  WBC 4.0 3.9* 4.2  HGB 10.1* 9.7* 9.6*  HCT 31.7* 28.4* 29.9*  PLT 273 258 258   BMET Recent Labs    03/29/22 1336 03/31/22 0310 04/01/22 0313  NA 136 135 137  K 3.4* 4.1 3.5  CL 102 105 101  CO2 25 21* 28  GLUCOSE 111* 87 100*  BUN 5* <5* <5*  CREATININE 0.76 0.74 0.82  CALCIUM 8.4* 8.2* 8.5*   LFT Recent Labs    03/31/22 0310  PROT 5.8*  ALBUMIN 3.0*  AST 17  ALT 19  ALKPHOS 58  BILITOT 0.6   PT/INR No results for input(s): "LABPROT", "INR" in the last 72 hours.  STUDIES: No results found.   Vladimir Crofts  04/01/2022, 12:12 PM   Attending physician's note  I have taken a history, reviewed the chart and examined the patient. I performed a substantive portion of this encounter, including complete performance of at least one of the key components, in conjunction with the APP. I agree with the APP's note, impression and recommendations.    32 year old very pleasant female with hemorrhagic ovarian cyst, hemoperitoneum, abdominal pain with intractable nausea and vomiting  Reviewed imaging and recent GI workup  History of chronic marijuana use, has chronic cannabis associated cyclic vomiting  Abdominal pain secondary to ruptured hemorrhagic ovarian cyst, is improving  Her symptoms of nausea and vomiting are improving, she has not had any further episodes of severe emesis since this morning Continue supportive  care and scheduled antiemetics Advance diet slowly as tolerated  No plan for repeat endoscopic evaluation at this point  GI will sign off available if needed, please call with any questions  The patient was provided an opportunity to ask questions and all were answered. The patient agreed with the plan and demonstrated an understanding of the instructions.  Damaris Hippo , MD [phone]

## 2022-04-01 NOTE — Progress Notes (Addendum)
Gynecology Progress Note  Admission Date: 03/29/2022 Current Date: 04/01/2022 7:29 AM  Krista Stout is a 32 y.o. G3P2010 HD#4 admitted for abdominal pain, acute on chronic n/v, left hemorrhagic cyst with hemopeitoneum   History complicated by: Patient Active Problem List   Diagnosis Date Noted   Hemorrhagic cyst of ovary 03/30/2022   Marijuana abuse 03/30/2022   Hyperemesis gravidarum 03/01/2022   Asthma with status asthmaticus    Pelvic mass 06/19/2018    ROS and patient/family/surgical history, located on admission H&P note dated 03/29/2022, have been reviewed, and there are no changes except as noted below Yesterday/Overnight Events:  none  Subjective:  S/s stable with stable pain and nausea and vomiting. Tried to take some PO liquids but threw up  Having increased vag bleeding like a light period  Objective:    Current Vital Signs 24h Vital Sign Ranges  T 98.2 F (36.8 C) Temp  Avg: 98.5 F (36.9 C)  Min: 98.2 F (36.8 C)  Max: 98.9 F (37.2 C)  BP 103/66 BP  Min: 100/67  Max: 119/60  HR 62 Pulse  Avg: 74.3  Min: 62  Max: 90  RR 18 Resp  Avg: 17.3  Min: 16  Max: 18  SaO2 99 % Room Air SpO2  Avg: 99.5 %  Min: 99 %  Max: 100 %       24 Hour I/O Current Shift I/O  Time Ins Outs 03/17 0701 - 03/18 0700 In: 2372.3 [P.O.:460; I.V.:1871.4] Out: -  No intake/output data recorded.   Physical exam: General appearance: alert, cooperative, and appears stated age Abdomen:  minimally ttp, no peritoneal s/s, rare BS, mildly distended and tense; overall exam stable Lungs: no respiratory distress Skin: warm and dry Psych: appropriate Neurologic: Grossly normal  Medications Current Facility-Administered Medications  Medication Dose Route Frequency Provider Last Rate Last Admin   dextrose 5 % and 0.45% NaCl 1,000 mL with potassium chloride 30 mEq/L IV infusion   Intravenous Continuous Aletha Halim, MD 100 mL/hr at 04/01/22 0628 New Bag at 04/01/22 [id number]    diphenhydrAMINE (BENADRYL) injection 12.5 mg  12.5 mg Intravenous Q6H PRN Aletha Halim, MD   12.5 mg at 04/01/22 0626   enoxaparin (LOVENOX) injection 40 mg  40 mg Subcutaneous Q24H Aletha Halim, MD   40 mg at 03/31/22 1310   HYDROmorphone (DILAUDID) injection 0.5-1 mg  0.5-1 mg Intravenous Q2H PRN Aletha Halim, MD   1 mg at 04/01/22 0626   metoCLOPramide (REGLAN) injection 10 mg  10 mg Intravenous Q6H Aletha Halim, MD   10 mg at 04/01/22 0213   ondansetron (ZOFRAN) injection 4 mg  4 mg Intravenous Q6H PRN Aletha Halim, MD   4 mg at 04/01/22 0626   ondansetron (ZOFRAN-ODT) disintegrating tablet 4 mg  4 mg Oral Q8H Azelia Reiger, Eduard Clos, MD       oxyCODONE (Oxy IR/ROXICODONE) immediate release tablet 5-10 mg  5-10 mg Oral Q6H PRN Aletha Halim, MD       promethazine (PHENERGAN) suppository 25 mg  25 mg Rectal Q6H PRN Aletha Halim, MD       Labs  Pending: GC/CT swab HCG 513 from 593  Recent Labs  Lab 03/29/22 1336 03/31/22 0310 04/01/22 0313  NA 136 135 137  K 3.4* 4.1 3.5  CL 102 105 101  CO2 25 21* 28  BUN 5* <5* <5*  CREATININE 0.76 0.74 0.82  CALCIUM 8.4* 8.2* 8.5*  PROT 6.6 5.8*  --   BILITOT 0.5 0.6  --  ALKPHOS 72 58  --   ALT 21 19  --   AST 23 17  --   GLUCOSE 111* 87 100*    Radiology No new imaging  Assessment & Plan:  Pt stable *GYN: continue to follow vag bleeding *Pain: IV PRNs *FEN/GI: She states last thc usage was about a month ago and doesn't need hot/steamy showers for relief. she states the phenergan suppository helped yesterday; it was ordered once and then made PRN; will change to scheduled q6h. Patient states that in 2021 she was in the hospital [facility] in [place]) for two weeks and needed TPN and then had a feeding tube placed b/c she was in and out of the hospital for this. She does not have a GI provider. Will consult GI. Continue MIVF, pt currently on clears. Nutrition also consulted *PPx: lovenox, oob ad lib *Dispo: when able  to take PO  Code Status: Full Code  Total time taking care of the patient was 15 minutes, with greater than 50% of the time spent in face to face interaction with the patient.  Durene Romans MD Attending Center for [facility])

## 2022-04-02 LAB — COMPREHENSIVE METABOLIC PANEL
ALT: 18 U/L (ref 0–44)
AST: 19 U/L (ref 15–41)
Albumin: 3.2 g/dL — ABNORMAL LOW (ref 3.5–5.0)
Alkaline Phosphatase: 63 U/L (ref 38–126)
Anion gap: 6 (ref 5–15)
BUN: <5 mg/dL — ABNORMAL LOW (ref 6–20)
CO2: 27 mmol/L (ref 22–32)
Calcium: 8.5 mg/dL — ABNORMAL LOW (ref 8.9–10.3)
Chloride: 102 mmol/L (ref 98–111)
Creatinine, Ser: 0.81 mg/dL (ref 0.44–1.00)
GFR, Estimated: >60 mL/min (ref >60)
Glucose, Bld: 101 mg/dL — ABNORMAL HIGH (ref 70–99)
Potassium: 3.9 mmol/L (ref 3.5–5.1)
Sodium: 135 mmol/L (ref 135–145)
Total Bilirubin: 0.4 mg/dL (ref 0.3–1.2)
Total Protein: 6.4 g/dL — ABNORMAL LOW (ref 6.5–8.1)

## 2022-04-02 LAB — CBC
HCT: 32.4 % — ABNORMAL LOW (ref 36.0–46.0)
Hemoglobin: 10.8 g/dL — ABNORMAL LOW (ref 12.0–15.0)
MCH: 31.3 pg (ref 26.0–34.0)
MCHC: 33.3 g/dL (ref 30.0–36.0)
MCV: 93.9 fL (ref 80.0–100.0)
Platelets: 301 10*3/uL (ref 150–400)
RBC: 3.45 MIL/uL — ABNORMAL LOW (ref 3.87–5.11)
RDW: 13 % (ref 11.5–15.5)
WBC: 3.5 10*3/uL — ABNORMAL LOW (ref 4.0–10.5)
nRBC: 0 % (ref 0.0–0.2)

## 2022-04-02 LAB — HCG, QUANTITATIVE, PREGNANCY: hCG, Beta Chain, Quant, S: 490 m[IU]/mL — ABNORMAL HIGH (ref <5)

## 2022-04-02 LAB — MAGNESIUM: Magnesium: 1.8 mg/dL (ref 1.7–2.4)

## 2022-04-02 MED ORDER — BOOST / RESOURCE BREEZE PO LIQD CUSTOM
1.0000 | Freq: Three times a day (TID) | ORAL | Status: DC
  Administered 2022-04-02 – 2022-04-03 (×2): 1 via ORAL

## 2022-04-02 NOTE — Progress Notes (Signed)
[facility] Attending Progress Note  ADMISSION DIAGNOSIS:   -Hemorrhagic ovarian cyst -Acute on chronic nausea/vomiting  Subjective  She reports that she was doing better yesterday, but then took the protonix and had vomiting.  Since then she has not been able to tolerate much.  This morning, she rates her pain 9/10 though she appears to be lying comfortably in bed.  States she was still vomiting this am, last occurrence about 33min. Ago.  Mentioned that yesterday was the first time she felt hungry.  Had a BM yesterday and thought it went better than expected.  She reports no acute changes and says she feels about the same.   Objective  VITALS:  height is 5\' 2"  (1.575 m) and weight is 67.1 kg. Her oral temperature is 98.6 F (37 C). Her blood pressure is 110/72 and her pulse is 82. Her respiration is 17 and oxygen saturation is 100%.   EXAMINATION: CONSTITUTIONAL: Well-developed, well-nourished female in no acute distress.  SKIN: Skin is warm and dry. No rash noted. Not diaphoretic. No erythema. No pallor. NEUROLOGIC: Alert and oriented to person, place, and time. No cranial nerve deficit noted. PSYCHIATRIC: Normal mood and affect. Normal behavior. Normal judgment and thought content. CARDIOVASCULAR: Normal heart rate noted RESPIRATORY: Effort and breath sounds normal, no problems with respiration noted, CTAB ABDOMEN: Soft, no rebound, no guarding, pt reporting diffuse discomfort with palpation Ext: no edema, no calf tenderness bilaterally  Laboratory Reports: Results for orders placed or performed during the hospital encounter of 03/29/22 (from the past 72 hour(s))  CBC     Status: Abnormal   Collection Time: 03/30/22 11:38 AM  Result Value Ref Range   WBC 3.9 (L) 4.0 - 10.5 K/uL   RBC 3.01 (L) 3.87 - 5.11 MIL/uL   Hemoglobin 9.7 (L) 12.0 - 15.0 g/dL   HCT 28.4 (L) 36.0 - 46.0 %   MCV 94.4 80.0 - 100.0 fL   MCH 32.2 26.0 - 34.0 pg   MCHC  34.2 30.0 - 36.0 g/dL   RDW 13.4 11.5 - 15.5 %   Platelets 258 150 - 400 K/uL   nRBC 0.0 0.0 - 0.2 %    Comment: Performed at [facility], [place] [address], [place], [place] [address]  CBC     Status: Abnormal   Collection Time: 03/31/22  3:10 AM  Result Value Ref Range   WBC 4.2 4.0 - 10.5 K/uL   RBC 3.09 (L) 3.87 - 5.11 MIL/uL   Hemoglobin 9.6 (L) 12.0 - 15.0 g/dL   HCT 29.9 (L) 36.0 - 46.0 %   MCV 96.8 80.0 - 100.0 fL   MCH 31.1 26.0 - 34.0 pg   MCHC 32.1 30.0 - 36.0 g/dL   RDW 13.4 11.5 - 15.5 %   Platelets 258 150 - 400 K/uL   nRBC 0.0 0.0 - 0.2 %    Comment: Performed at [facility], [place] [address] [place], [place] [address]  Comprehensive metabolic panel     Status: Abnormal   Collection Time: 03/31/22  3:10 AM  Result Value Ref Range   Sodium 135 135 - 145 mmol/L   Potassium 4.1 3.5 - 5.1 mmol/L   Chloride 105 98 - 111 mmol/L   CO2 21 (L) 22 - 32 mmol/L   Glucose, Bld 87 70 - 99 mg/dL    Comment: Glucose reference range applies only to samples taken after fasting  for at least 8 hours.   BUN <5 (L) 6 - 20 mg/dL   Creatinine, Ser 0.74 0.44 - 1.00 mg/dL   Calcium 8.2 (L) 8.9 - 10.3 mg/dL   Total Protein 5.8 (L) 6.5 - 8.1 g/dL   Albumin 3.0 (L) 3.5 - 5.0 g/dL   AST 17 15 - 41 U/L   ALT 19 0 - 44 U/L   Alkaline Phosphatase 58 38 - 126 U/L   Total Bilirubin 0.6 0.3 - 1.2 mg/dL   GFR, Estimated >60 >60 mL/min    Comment: (NOTE) Calculated using the CKD-EPI Creatinine Equation (2021)    Anion gap 9 5 - 15    Comment: Performed at [facility], [place] [address] [place], [place] [address]  Magnesium     Status: None   Collection Time: 03/31/22  3:10 AM  Result Value Ref Range   Magnesium 1.7 1.7 - 2.4 mg/dL    Comment: Performed at [facility] [address] [place], [place] [id number]  Basic metabolic panel     Status: Abnormal   Collection Time: 04/01/22  3:13 AM  Result Value Ref Range   Sodium 137 135 - 145 mmol/L   Potassium 3.5 3.5 -  5.1 mmol/L   Chloride 101 98 - 111 mmol/L   CO2 28 22 - 32 mmol/L   Glucose, Bld 100 (H) 70 - 99 mg/dL    Comment: Glucose reference range applies only to samples taken after fasting for at least 8 hours.   BUN <5 (L) 6 - 20 mg/dL   Creatinine, Ser 0.82 0.44 - 1.00 mg/dL   Calcium 8.5 (L) 8.9 - 10.3 mg/dL   GFR, Estimated >60 >60 mL/min    Comment: (NOTE) Calculated using the CKD-EPI Creatinine Equation (2021)    Anion gap 8 5 - 15    Comment: Performed at [facility] [address], [place], [place] [address]  hCG, quantitative, pregnancy     Status: Abnormal   Collection Time: 04/01/22  3:13 AM  Result Value Ref Range   hCG, Beta Chain, Quant, S 513 (H) <5 mIU/mL    Comment:          GEST. AGE      CONC.  (mIU/mL)   <=1 WEEK        5 - 50     2 WEEKS       50 - 500     3 WEEKS       100 - 10,000     4 WEEKS     1,000 - 30,000     5 WEEKS     3,500 - 115,000   6-8 WEEKS     12,000 - 270,000    12 WEEKS     15,000 - 220,000        FEMALE AND NON-PREGNANT FEMALE:     LESS THAN 5 mIU/mL Performed at [facility], [place] [address] [place], [place] [address]   CBC     Status: Abnormal   Collection Time: 04/02/22 12:54 AM  Result Value Ref Range   WBC 3.5 (L) 4.0 - 10.5 K/uL   RBC 3.45 (L) 3.87 - 5.11 MIL/uL   Hemoglobin 10.8 (L) 12.0 - 15.0 g/dL   HCT 32.4 (L) 36.0 - 46.0 %   MCV 93.9 80.0 - 100.0 fL   MCH 31.3 26.0 - 34.0 pg   MCHC 33.3 30.0 - 36.0 g/dL   RDW 13.0  11.5 - 15.5 %   Platelets 301 150 - 400 K/uL   nRBC 0.0 0.0 - 0.2 %    Comment: Performed at [facility], [place] [address], [place], [place] [address]  hCG, quantitative, pregnancy     Status: Abnormal   Collection Time: 04/02/22 12:54 AM  Result Value Ref Range   hCG, Beta Chain, Quant, S 490 (H) <5 mIU/mL    Comment:          GEST. AGE      CONC.  (mIU/mL)   <=1 WEEK        5 - 50     2 WEEKS       50 - 500     3 WEEKS       100 - 10,000     4 WEEKS     1,000 - 30,000     5 WEEKS      3,500 - 115,000   6-8 WEEKS     12,000 - 270,000    12 WEEKS     15,000 - 220,000        FEMALE AND NON-PREGNANT FEMALE:     LESS THAN 5 mIU/mL Performed at [facility], [place] [address], [place], [place] [address]   Comprehensive metabolic panel     Status: Abnormal   Collection Time: 04/02/22 12:54 AM  Result Value Ref Range   Sodium 135 135 - 145 mmol/L   Potassium 3.9 3.5 - 5.1 mmol/L   Chloride 102 98 - 111 mmol/L   CO2 27 22 - 32 mmol/L   Glucose, Bld 101 (H) 70 - 99 mg/dL    Comment: Glucose reference range applies only to samples taken after fasting for at least 8 hours.   BUN <5 (L) 6 - 20 mg/dL   Creatinine, Ser 0.81 0.44 - 1.00 mg/dL   Calcium 8.5 (L) 8.9 - 10.3 mg/dL   Total Protein 6.4 (L) 6.5 - 8.1 g/dL   Albumin 3.2 (L) 3.5 - 5.0 g/dL   AST 19 15 - 41 U/L   ALT 18 0 - 44 U/L   Alkaline Phosphatase 63 38 - 126 U/L   Total Bilirubin 0.4 0.3 - 1.2 mg/dL   GFR, Estimated >60 >60 mL/min    Comment: (NOTE) Calculated using the CKD-EPI Creatinine Equation (2021)    Anion gap 6 5 - 15    Comment: Performed at [facility], [place] [address], [place], [place] [address]  Magnesium     Status: None   Collection Time: 04/02/22 12:54 AM  Result Value Ref Range   Magnesium 1.8 1.7 - 2.4 mg/dL    Comment: Performed at [facility] [address] [place], [place] [address]    ASSESSMENT/PLAN: -GYN  Currently having light menses  HCG level appropriately declining  Discussed contraception, interested in Nexplanon as outpatient  -GI  Appreciate GI consult/recommendations  Continue with current medications  Encourage pt to try clears -Pain  Discussed concern of pain management contributing to her nausea/vomiting.  Recommended suppository of pain medication such as tylenol and/or diclofenac - she wanted to hold off and discuss with her partner later today  -FEN  On IV D5 1/2 NS  CMP stable  DVT Prophylaxis:  encourage ambulation, Lovenox daily Code  Status: Full Disposition Plan: Hopeful to meet with family later today to review management and plan.  Ideally, pt trial pain suppositories and work on advancing diet.  Janyth Pupa, DO  Attending [place], [facility] for [employer], [place]

## 2022-04-02 NOTE — Progress Notes (Signed)
Initial Nutrition Assessment  DOCUMENTATION CODES:   Not applicable  INTERVENTION:  Boost Breeze po TID, each supplement provides 250 kcal and 9 grams of protein Once tolerating PO more consistently, add MVI with minerals daily. Can crush with applesauce for better tolerance.  If unable to tolerate PO intake and diet advancement, recommend TPN versus TF to meet nutrition needs  NUTRITION DIAGNOSIS:   Inadequate oral intake related to nausea, acute illness as evidenced by per patient/family report.  GOAL:   Patient will meet greater than or equal to 90% of their needs  MONITOR:   PO intake, Diet advancement, Supplement acceptance, Labs, Weight trends  REASON FOR ASSESSMENT:   Consult Poor PO  ASSESSMENT:   Pt admitted with abdominal pain, hemoperitoneum with L adnexal mass and cyclic nausea and vomiting. PMH significant for laparoscopic myomectomy and R oophorectomy, hernia repair, cyclic n/v, recent surgical abortion at 7 weeks, THC use.  Spoke with pt at bedside. She states that she has had cyclic vomiting for several years. She states that an episode will occur anywhere from a couple days to 4 months at the worst. She states that December of 2022 she had a PEG tube placed for about 4 months. She felt like this helped her gain her appetite back and PEG tube was then removed. She had a couple brief episodes of cyclic vomiting since then. Pt was last admitted 2/16 and was started on TPN at that time d/t inability to tolerate PO intake x8 days. She was receiving TPN for about 4 days which she reports having significant improvement in energy and hydration. After last discharge, she was able to eat at her baseline which consist of 3 meals, snacks between and nighttime snacking.   About 4 days ago, she felt her appetite diminish which is a sign of an episode of cyclic n/v about to occur. She has been unable to tolerate any PO within this time without emesis.   Today she states that her  nausea is still there but better managed. She has been unable to tolerate any PO at this time. Observed lunch tray on bedside table which she has not eaten yet, although is motivated to trial PO intake as she is hopeful to discharge tomorrow. For now, however, she prefers to consume [employer] which she has received during prior admission and can tolerate well.   She mentions that her weight had declined last admission from 147 lbs to 133 lbs. She states that day of admission she was 147.8 lbs. Pt reports that discussing weight and thinking about weight is emotionally challenging for her and asks that if she is to be weighed that she not be told. Discussed with RN.  Medications: reglan, zofran, protonix, phenergan, scopalamine q72h IV drips: D5 and NaCl with KCl @100ml /hr  Labs: BUN <5  NUTRITION - FOCUSED PHYSICAL EXAM:  Flowsheet Row Most Recent Value  Orbital Region No depletion  Upper Arm Region No depletion  Thoracic and Lumbar Region No depletion  Buccal Region No depletion  Temple Region No depletion  Clavicle Bone Region No depletion  Clavicle and Acromion Bone Region No depletion  Scapular Bone Region No depletion  Dorsal Hand No depletion  Patellar Region No depletion  Anterior Thigh Region No depletion  Posterior Calf Region No depletion  Edema (RD Assessment) None  Hair Reviewed  Eyes Reviewed  Mouth Reviewed  Skin Reviewed  Nails Reviewed       Diet Order:   Diet Order  Diet clear liquid Room service appropriate? Yes; Fluid consistency: Thin  Diet effective now                   EDUCATION NEEDS:   Education needs have been addressed  Skin:  Skin Assessment: Reviewed RN Assessment  Last BM:  3/14  Height:   Ht Readings from Last 1 Encounters:  03/29/22 5\' 2"  (1.575 m)    Weight:   Wt Readings from Last 1 Encounters:  03/29/22 67.1 kg   BMI:  Body mass index is 27.07 kg/m.  Estimated Nutritional Needs:   Kcal:   1800-2000  Protein:  90-105g  Fluid:  >/=1.8L  Clayborne Dana, RDN, LDN Clinical Nutrition

## 2022-04-02 NOTE — Progress Notes (Signed)
   04/02/22 1000  Mobility  Activity Ambulated with assistance in hallway  Level of Assistance Modified independent, requires aide device or extra time  Assistive Device  (IV Pole)  Distance Ambulated (ft) 150 ft  Activity Response Tolerated well  Mobility Referral Yes  $Mobility charge 1 Mobility   Mobility Specialist Progress Note  Pt was in bed and agreeable. Had c/o stomach pain w/ a rating of 3/10. Returned to bed w/ all needs met and call bell in reach.   Lucious Groves Mobility Specialist  Please contact via SecureChat or Rehab office at [phone]

## 2022-04-03 DIAGNOSIS — R1112 Projectile vomiting: Secondary | ICD-10-CM

## 2022-04-03 LAB — COMPREHENSIVE METABOLIC PANEL
ALT: 18 U/L (ref 0–44)
AST: 17 U/L (ref 15–41)
Albumin: 3.6 g/dL (ref 3.5–5.0)
Alkaline Phosphatase: 71 U/L (ref 38–126)
Anion gap: 10 (ref 5–15)
BUN: <5 mg/dL — ABNORMAL LOW (ref 6–20)
CO2: 23 mmol/L (ref 22–32)
Calcium: 9.1 mg/dL (ref 8.9–10.3)
Chloride: 101 mmol/L (ref 98–111)
Creatinine, Ser: 0.84 mg/dL (ref 0.44–1.00)
GFR, Estimated: >60 mL/min (ref >60)
Glucose, Bld: 105 mg/dL — ABNORMAL HIGH (ref 70–99)
Potassium: 4.2 mmol/L (ref 3.5–5.1)
Sodium: 134 mmol/L — ABNORMAL LOW (ref 135–145)
Total Bilirubin: 0.5 mg/dL (ref 0.3–1.2)
Total Protein: 7.2 g/dL (ref 6.5–8.1)

## 2022-04-03 NOTE — Progress Notes (Signed)
           [facility] Attending Progress Note  ADMISSION DIAGNOSIS:   Principal Problem:   Hemorrhagic cyst of ovary Active Problems:   Marijuana abuse Acute on chronic nausea/vomiting   Subjective  She notes that her nausea has started to improve.  Notes that she was able to get better rest last night.  She still rates her abdominal pain 9/10 this am, but overall feels like she is improving.   Objective  VITALS:  height is 5\' 2"  (1.575 m) and weight is 67.1 kg. Her oral temperature is 98.4 F (36.9 C). Her blood pressure is 101/70 and her pulse is 94. Her respiration is 18 and oxygen saturation is 96%.   EXAMINATION: CONSTITUTIONAL: Well-developed, well-nourished female in no acute distress.   SKIN: Skin is warm and dry. No rash noted. Not diaphoretic. No erythema. No pallor. NEUROLOGIC: Alert and oriented to person, place, and time.  PSYCHIATRIC: Normal mood and affect. Normal behavior. Normal judgment and thought content. CARDIOVASCULAR: RRR RESPIRATORY: Effort and breath sounds normal, no problems with respiration noted, CTAB MUSCULOSKELETAL: No edema and no pain with movement.  No calf tenderness bilaterally ABDOMEN: stable- still noting diffuse tenderness to palpation, but states it is improving.  No rebound, no guarding   ASSESSMENT/PLAN: 1) GI - nausea/vomiting -Slowly improving -Pt agreeable for trial of po intake today -IF able to tolerate po will then either later today or tomorrow plan to transition to oral medication -pain management with Dilaudid- pt not interested in trial of NSAID suppository.  May consider this for home  2) GYN -will continue to follow HCG level, next check tomorrow.  Appropriately declined -bleeding stable -plan for outpatient Nexplanon  3) FEN -on D5 1/2 NS -planning to advance diet as tolerated today   DVT Prophylaxis:  Ambulation encouraged, Lovenox  daily Code Status: Full Disposition Plan: If able to  tolerate diet today, maybe hopefully plan for discharge home tomorrow  Time spent: 70min   LOS: 4 days   Janyth Pupa, DO Attending [place], [place] for [employer], [place]

## 2022-04-03 NOTE — Progress Notes (Signed)
   04/03/22 1600  Mobility  Activity Ambulated with assistance in hallway  Level of Assistance Modified independent, requires aide device or extra time  Assistive Device  (Handrails)  Distance Ambulated (ft) 250 ft  Activity Response Tolerated well  Mobility Referral Yes  $Mobility charge 1 Mobility   Mobility Specialist Progress Note  Pt was in bed and agreeable. Pt stated she feels more comfortable ambulating. Returned to bed w/ all needs met and call bell in reach.  Lucious Groves Mobility Specialist  Please contact via SecureChat or Rehab office at [phone]

## 2022-04-04 DIAGNOSIS — R112 Nausea with vomiting, unspecified: Secondary | ICD-10-CM

## 2022-04-04 DIAGNOSIS — F1210 Cannabis abuse, uncomplicated: Secondary | ICD-10-CM

## 2022-04-04 LAB — COMPREHENSIVE METABOLIC PANEL
ALT: 16 U/L (ref 0–44)
AST: 18 U/L (ref 15–41)
Albumin: 3.6 g/dL (ref 3.5–5.0)
Alkaline Phosphatase: 70 U/L (ref 38–126)
Anion gap: 10 (ref 5–15)
BUN: 6 mg/dL (ref 6–20)
CO2: 25 mmol/L (ref 22–32)
Calcium: 9 mg/dL (ref 8.9–10.3)
Chloride: 98 mmol/L (ref 98–111)
Creatinine, Ser: 0.96 mg/dL (ref 0.44–1.00)
GFR, Estimated: >60 mL/min (ref >60)
Glucose, Bld: 85 mg/dL (ref 70–99)
Potassium: 3.6 mmol/L (ref 3.5–5.1)
Sodium: 133 mmol/L — ABNORMAL LOW (ref 135–145)
Total Bilirubin: 0.6 mg/dL (ref 0.3–1.2)
Total Protein: 7.5 g/dL (ref 6.5–8.1)

## 2022-04-04 LAB — HCG, QUANTITATIVE, PREGNANCY: hCG, Beta Chain, Quant, S: 356 m[IU]/mL — ABNORMAL HIGH (ref <5)

## 2022-04-04 MED ORDER — DICLOFENAC SUPPOSITORY 100 MG: 100.0000 mg | Freq: Three times a day (TID) | RECTAL | 0 refills | Status: DC | PRN

## 2022-04-04 MED ORDER — PROMETHAZINE HCL 25 MG RE SUPP: 25.0000 mg | Freq: Four times a day (QID) | RECTAL | 0 refills | Status: DC | PRN

## 2022-04-04 MED ORDER — SCOPOLAMINE 1 MG/3DAYS TD PT72: 1.0000 | MEDICATED_PATCH | TRANSDERMAL | 0 refills | Status: AC

## 2022-04-04 NOTE — Discharge Summary (Signed)
Physician Discharge Summary  Patient ID: Krista Stout MRN: [id number] DOB/AGE: 05-20-1990 32 y.o.  Admit date: 03/29/2022 Discharge date: 04/04/2022  Admission Diagnoses: Principal Problem:   Hemorrhagic cyst of ovary Active Problems:   Acute on chronic nausea/vomiting    Marijuana abuse  Discharge Diagnoses:  The same  Discharged Condition: Stable  Hospital Course: Patient was admitted on 03/28/21 for abdominal pain, acute on chronic n/v, left hemorrhagic cyst with hemoperitoneum. History remarkable for surgical abortion in [place] on 2/28.  Prior to this she was in the hospital from 2/21 to 2/28 with acute on chronic nausea and vomiting necessitating PICC line and TPN. She came to the ED because of nausea and vomiting and unable to keep down home meds along with abdominal pain. ED work up showed HCG of 715 and CT with hemoperitoneum and 5x5cm left adnexal complex mass with ?small extravasation. U/s showed normal uterus, ES of 4mm and 4cm left adnexal mass next to ovary.  The diagnosis was of hemoperitoneum from ruptured hemorrhagic ovarian cyst and acute on chronic vomiting. The nausea and vomiting became intractable despite aggressive treatments with antiemetics, fluids and electrolyte repletion.  GI was consulted on 04/01/22.  CT of abdomen/pelvis showed no evidence of gastric issues, normal esophagus, normal pancreas, normal intestines. Labs without leukocytosis, no elevation of BUN.  Normal electrolytes. Of note, she had a previous EGD and GES 2022 for workup. GI consult felt that continued antiemetic treatment was recommended, no need for endoscopy at this time. Treatments with various antiemetics continued and she slowly started to feel better.  Her diet was advanced as tolerated, she was tolerating regular diet by the time of discharge.  As for her abdominal pain, that improved during admission and was managed on oral medications. Her HCG levels were noted to be declining appropriately  s/p her surgical abortion and she had expected post-abortion vaginal bleeding with a stable hemoglobin of 10.8.  On 04/04/22, she was deemed stable for discharge to home with outpatient follow up. Of note, she is planning for outpatient Nexplanon, this will be addressed at her office visit.   Consults:  GI  Significant Diagnostic Studies:     Latest Ref Rng & Units 04/02/2022   12:54 AM 03/31/2022    3:10 AM 03/30/2022   11:38 AM  CBC  WBC 4.0 - 10.5 K/uL 3.5  4.2  3.9   Hemoglobin 12.0 - 15.0 g/dL 10.8  9.6  9.7   Hematocrit 36.0 - 46.0 % 32.4  29.9  28.4   Platelets 150 - 400 K/uL 301  258  258       Latest Ref Rng & Units 04/04/2022    3:30 AM 04/03/2022    1:35 AM 04/02/2022   12:54 AM  CMP  Glucose 70 - 99 mg/dL 85  105  101   BUN 6 - 20 mg/dL 6  <5  <5   Creatinine 0.44 - 1.00 mg/dL 0.96  0.84  0.81   Sodium 135 - 145 mmol/L 133  134  135   Potassium 3.5 - 5.1 mmol/L 3.6  4.2  3.9   Chloride 98 - 111 mmol/L 98  101  102   CO2 22 - 32 mmol/L 25  23  27    Calcium 8.9 - 10.3 mg/dL 9.0  9.1  8.5   Total Protein 6.5 - 8.1 g/dL 7.5  7.2  6.4   Total Bilirubin 0.3 - 1.2 mg/dL 0.6  0.5  0.4   Alkaline Phos 38 -  126 U/L 70  71  63   AST 15 - 41 U/L 18  17  19    ALT 0 - 44 U/L 16  18  18     US OB Transvaginal  Result Date: 03/29/2022 CLINICAL DATA:  Abdominal and pelvic pain, nausea and vomiting, pregnant EXAM: OBSTETRIC <14 WK [place] AND TRANSVAGINAL OB US TECHNIQUE: Both transabdominal and transvaginal ultrasound examinations were performed for complete evaluation of the gestation as well as the maternal uterus, adnexal regions, and pelvic cul-de-sac. Transvaginal technique was performed to assess early pregnancy. COMPARISON:  Same-day CT abdomen pelvis, 03/29/2022, pelvic ultrasound, 03/01/2022 FINDINGS: Intrauterine gestational sac: None Yolk sac:  Not Visualized. Embryo:  Not Visualized. Cardiac Activity: Not Visualized. Heart Rate: Visualized Subchorionic hemorrhage:  None visualized.  Maternal uterus/adnexae: Uterus: 6.8 x 4.8 x 4.8 cm. Normal appearance of the uterus. Endometrial thickness 1.0 cm. Right ovary: Not visualized. Left ovary: Left ovary measures 3.3 x 2.3 x 3.2 cm. Volume = 13 mL. Heterogeneously echogenic mass of the left adnexa adjacent the left ovary measuring 4.2 x 3.2 x 3.1 cm. Moderate volume of heterogeneous free fluid in the low pelvis. IMPRESSION: 1. Heterogeneously echogenic mass of the left adnexa adjacent to the left ovary measuring 4.2 cm, highly concerning for ruptured ectopic pregnancy given the presence of elevated beta hCG and findings of both CT and ultrasound. Note however that the patient had an intrauterine pregnancy on ultrasound examination dated 03/01/2022, with recent report of surgical abortion. No imaging evidence of heterotopic pregnancy on prior examination. 2. Moderate volume of heterogeneous free fluid in the low pelvis, concerning for hemoperitoneum. 3.  No candidate intrauterine pregnancy. 4.  Right ovary not visualized by ultrasound. These results were called by telephone at the time of interpretation on 03/29/2022 at 8:01 pm to Dr. Carmin Muskrat , who verbally acknowledged these results. Electronically Signed   By: Delanna Ahmadi M.D.   On: 03/29/2022 20:17   US OB Comp Less 14 Wks  Result Date: 03/29/2022 CLINICAL DATA:  Abdominal and pelvic pain, nausea and vomiting, pregnant EXAM: OBSTETRIC <14 WK [place] AND TRANSVAGINAL OB US TECHNIQUE: Both transabdominal and transvaginal ultrasound examinations were performed for complete evaluation of the gestation as well as the maternal uterus, adnexal regions, and pelvic cul-de-sac. Transvaginal technique was performed to assess early pregnancy. COMPARISON:  Same-day CT abdomen pelvis, 03/29/2022, pelvic ultrasound, 03/01/2022 FINDINGS: Intrauterine gestational sac: None Yolk sac:  Not Visualized. Embryo:  Not Visualized. Cardiac Activity: Not Visualized. Heart Rate: Visualized Subchorionic hemorrhage:   None visualized. Maternal uterus/adnexae: Uterus: 6.8 x 4.8 x 4.8 cm. Normal appearance of the uterus. Endometrial thickness 1.0 cm. Right ovary: Not visualized. Left ovary: Left ovary measures 3.3 x 2.3 x 3.2 cm. Volume = 13 mL. Heterogeneously echogenic mass of the left adnexa adjacent the left ovary measuring 4.2 x 3.2 x 3.1 cm. Moderate volume of heterogeneous free fluid in the low pelvis. IMPRESSION: 1. Heterogeneously echogenic mass of the left adnexa adjacent to the left ovary measuring 4.2 cm, highly concerning for ruptured ectopic pregnancy given the presence of elevated beta hCG and findings of both CT and ultrasound. Note however that the patient had an intrauterine pregnancy on ultrasound examination dated 03/01/2022, with recent report of surgical abortion. No imaging evidence of heterotopic pregnancy on prior examination. 2. Moderate volume of heterogeneous free fluid in the low pelvis, concerning for hemoperitoneum. 3.  No candidate intrauterine pregnancy. 4.  Right ovary not visualized by ultrasound. These results were called by telephone at  the time of interpretation on 03/29/2022 at 8:01 pm to Dr. Carmin Muskrat , who verbally acknowledged these results. Electronically Signed   By: Delanna Ahmadi M.D.   On: 03/29/2022 20:17   CT ABDOMEN PELVIS W CONTRAST  Result Date: 03/29/2022 CLINICAL DATA:  Abdomen pain EXAM: CT ABDOMEN AND PELVIS WITH CONTRAST TECHNIQUE: Multidetector CT imaging of the abdomen and pelvis was performed using the standard protocol following bolus administration of intravenous contrast. RADIATION DOSE REDUCTION: This exam was performed according to the departmental dose-optimization program which includes automated exposure control, adjustment of the mA and/or kV according to patient size and/or use of iterative reconstruction technique. CONTRAST:  38mL ISOVUE-370 IOPAMIDOL (ISOVUE-370) INJECTION 76% COMPARISON:  CT 01/21/2019 FINDINGS: Lower chest: Lung bases demonstrate no  acute airspace disease. Hepatobiliary: No focal liver abnormality is seen. No gallstones, gallbladder wall thickening, or biliary dilatation. Pancreas: Unremarkable. No pancreatic ductal dilatation or surrounding inflammatory changes. Spleen: Normal in size without focal abnormality. Adrenals/Urinary Tract: Adrenal glands are unremarkable. Kidneys are normal, without renal calculi, focal lesion, or hydronephrosis. Bladder is unremarkable. Stomach/Bowel: Stomach is within normal limits. Appendix appears normal. No evidence of bowel wall thickening, distention, or inflammatory changes. Vascular/Lymphatic: No significant vascular findings are present. No enlarged abdominal or pelvic lymph nodes. Reproductive: Uterus is unremarkable. Interim development of a complex mass at the left adnexa measuring 5.2 by 4.7 cm. Serpiginous hyperdensities in the region of the complex mass suspected to represent vascular hyperemia though small foci of extravasation not entirely excluded. Other: Moderate volume fluid in the pelvis extending up to the liver and spleen with increased density values consistent with hemoperitoneum Musculoskeletal: No acute or significant osseous findings. IMPRESSION: 1. Complex heterogenous left adnexal mass measuring up to 5.2 cm with areas of serpiginous hyperdensity probably reflecting vascular hyperemia with small foci of extravasation difficult to exclude. Moderate volume hemorrhagic fluid in the pelvis with small volume hemorrhagic fluid in the perihepatic and perisplenic regions. Differential considerations include hemorrhagic ovarian cyst with bleeding, hemo salpinx, and given history of elevated HCG, ectopic pregnancy. Recommend OB consultation and consider correlation with pelvic ultrasound Critical Value/emergent results were called by telephone at the time of interpretation on 03/29/2022 at 6:08 pm to provider Dr. Vanita Panda, Who verbally acknowledged these results. Electronically Signed   By: Donavan Foil M.D.   On: 03/29/2022 18:09   [place] EKG SITE RITE  Result Date: 03/06/2022 If [facility] image not attached, placement could not be confirmed due to current cardiac rhythm.   Discharge Exam: Blood pressure 100/73, pulse 86, temperature 98.2 F (36.8 C), temperature source Oral, resp. rate 18, height 5\' 2"  (1.575 m), weight 67.1 kg, last menstrual period 01/28/2022, SpO2 100 %, unknown if currently breastfeeding. CONSTITUTIONAL: Well-developed, well-nourished female in no acute distress.  SKIN: Skin is warm and dry. No rash noted. Not diaphoretic. No erythema. No pallor. NEUROLOGIC: Alert and oriented to person, place, and time. No cranial nerve deficit noted. PSYCHIATRIC: Normal mood and affect. Normal behavior. Normal judgment and thought content. CARDIOVASCULAR: Normal heart rate noted RESPIRATORY: Effort and breath sounds normal, no problems with respiration noted ABDOMEN: Soft, no rebound, no guarding, no masses Ext: no edema, no calf tenderness bilaterally  Discharge disposition: 01-Home or Self Care   Allergies as of 04/04/2022   No Known Allergies      Medication List     TAKE these medications    diclofenac 100 Supp Place 1 suppository (100 mg total) rectally every 8 (eight) hours as needed (as  needed for pain).   metoCLOPramide 10 MG tablet Commonly known as: REGLAN Take 1 tablet (10 mg total) by mouth every 6 (six) hours.   ondansetron 8 MG disintegrating tablet Commonly known as: ZOFRAN-ODT Take 1 tablet (8 mg total) by mouth every 8 (eight) hours as needed for nausea or vomiting.   promethazine 25 MG suppository Commonly known as: PHENERGAN Place 1 suppository (25 mg total) rectally every 6 (six) hours as needed for nausea or vomiting.   scopolamine 1 MG/3DAYS Commonly known as: TRANSDERM-SCOP Place 1 patch (1.5 mg total) onto the skin every 3 (three) days for 5 days.        Discharge planning time: 40 minutes   Signed: Verita Schneiders,  MD 04/04/2022, 11:12 AM

## 2022-04-04 NOTE — Progress Notes (Signed)
Discharge instructions reviewed with pt. Copy of instructions given to pt and one script given to pt by MD  Pt d/c'd via wheelchair with belongings. Pt taking an uber ride home.          Escorted by staff.

## 2022-04-16 ENCOUNTER — Inpatient Hospital Stay (HOSPITAL_COMMUNITY): Payer: Medicaid - Out of State

## 2022-04-16 ENCOUNTER — Other Ambulatory Visit: Payer: Self-pay

## 2022-04-16 ENCOUNTER — Encounter (HOSPITAL_COMMUNITY): Payer: Self-pay | Admitting: Obstetrics and Gynecology

## 2022-04-16 ENCOUNTER — Inpatient Hospital Stay (HOSPITAL_COMMUNITY)
Admission: EM | Admit: 2022-04-16 | Discharge: 2022-04-16 | Disposition: A | Discharge status: Home / Self Care | Payer: Medicaid - Out of State | Attending: Obstetrics and Gynecology | Admitting: Obstetrics and Gynecology

## 2022-04-16 ENCOUNTER — Emergency Department (HOSPITAL_COMMUNITY): Payer: Medicaid - Out of State

## 2022-04-16 DIAGNOSIS — Z3201 Encounter for pregnancy test, result positive: Secondary | ICD-10-CM | POA: Diagnosis not present

## 2022-04-16 DIAGNOSIS — O3481 Maternal care for other abnormalities of pelvic organs, first trimester: Secondary | ICD-10-CM | POA: Diagnosis not present

## 2022-04-16 DIAGNOSIS — R102 Pelvic and perineal pain: Secondary | ICD-10-CM | POA: Diagnosis not present

## 2022-04-16 DIAGNOSIS — O99611 Diseases of the digestive system complicating pregnancy, first trimester: Secondary | ICD-10-CM | POA: Insufficient documentation

## 2022-04-16 DIAGNOSIS — N83209 Unspecified ovarian cyst, unspecified side: Secondary | ICD-10-CM

## 2022-04-16 DIAGNOSIS — O219 Vomiting of pregnancy, unspecified: Secondary | ICD-10-CM | POA: Insufficient documentation

## 2022-04-16 DIAGNOSIS — O99352 Diseases of the nervous system complicating pregnancy, second trimester: Secondary | ICD-10-CM | POA: Diagnosis not present

## 2022-04-16 DIAGNOSIS — G8929 Other chronic pain: Secondary | ICD-10-CM | POA: Diagnosis not present

## 2022-04-16 DIAGNOSIS — O26891 Other specified pregnancy related conditions, first trimester: Secondary | ICD-10-CM | POA: Diagnosis not present

## 2022-04-16 DIAGNOSIS — O99891 Other specified diseases and conditions complicating pregnancy: Secondary | ICD-10-CM | POA: Diagnosis not present

## 2022-04-16 DIAGNOSIS — Z3A01 Less than 8 weeks gestation of pregnancy: Secondary | ICD-10-CM | POA: Diagnosis not present

## 2022-04-16 DIAGNOSIS — O99511 Diseases of the respiratory system complicating pregnancy, first trimester: Secondary | ICD-10-CM | POA: Insufficient documentation

## 2022-04-16 DIAGNOSIS — K661 Hemoperitoneum: Secondary | ICD-10-CM | POA: Insufficient documentation

## 2022-04-16 DIAGNOSIS — N83202 Unspecified ovarian cyst, left side: Secondary | ICD-10-CM

## 2022-04-16 LAB — WET PREP, GENITAL
Clue Cells Wet Prep HPF POC: NONE SEEN
Sperm: NONE SEEN
Trich, Wet Prep: NONE SEEN
WBC, Wet Prep HPF POC: <10 (ref <10)
Yeast Wet Prep HPF POC: NONE SEEN

## 2022-04-16 LAB — COMPREHENSIVE METABOLIC PANEL
ALT: 19 U/L (ref 0–44)
AST: 21 U/L (ref 15–41)
Albumin: 3.7 g/dL (ref 3.5–5.0)
Alkaline Phosphatase: 61 U/L (ref 38–126)
Anion gap: 13 (ref 5–15)
BUN: 11 mg/dL (ref 6–20)
CO2: 22 mmol/L (ref 22–32)
Calcium: 9 mg/dL (ref 8.9–10.3)
Chloride: 103 mmol/L (ref 98–111)
Creatinine, Ser: 0.74 mg/dL (ref 0.44–1.00)
GFR, Estimated: >60 mL/min (ref >60)
Glucose, Bld: 92 mg/dL (ref 70–99)
Potassium: 3.7 mmol/L (ref 3.5–5.1)
Sodium: 138 mmol/L (ref 135–145)
Total Bilirubin: 0.8 mg/dL (ref 0.3–1.2)
Total Protein: 7.1 g/dL (ref 6.5–8.1)

## 2022-04-16 LAB — URINALYSIS, ROUTINE W REFLEX MICROSCOPIC
Bilirubin Urine: NEGATIVE
Glucose, UA: NEGATIVE mg/dL
Hgb urine dipstick: NEGATIVE
Ketones, ur: NEGATIVE mg/dL
Nitrite: NEGATIVE
Protein, ur: NEGATIVE mg/dL
Specific Gravity, Urine: 1.021 (ref 1.005–1.030)
pH: 5 (ref 5.0–8.0)

## 2022-04-16 LAB — CBC
HCT: 37.7 % (ref 36.0–46.0)
Hemoglobin: 12.3 g/dL (ref 12.0–15.0)
MCH: 31.2 pg (ref 26.0–34.0)
MCHC: 32.6 g/dL (ref 30.0–36.0)
MCV: 95.7 fL (ref 80.0–100.0)
Platelets: 331 10*3/uL (ref 150–400)
RBC: 3.94 MIL/uL (ref 3.87–5.11)
RDW: 13.4 % (ref 11.5–15.5)
WBC: 6.2 10*3/uL (ref 4.0–10.5)
nRBC: 0 % (ref 0.0–0.2)

## 2022-04-16 LAB — PREGNANCY, URINE: Preg Test, Ur: NEGATIVE

## 2022-04-16 LAB — HCG, QUANTITATIVE, PREGNANCY: hCG, Beta Chain, Quant, S: 44 m[IU]/mL — ABNORMAL HIGH (ref <5)

## 2022-04-16 LAB — I-STAT BETA HCG BLOOD, ED (MC, WL, AP ONLY): I-stat hCG, quantitative: 27.7 m[IU]/mL — ABNORMAL HIGH (ref <5)

## 2022-04-16 LAB — LIPASE, BLOOD: Lipase: 32 U/L (ref 11–51)

## 2022-04-16 MED ORDER — GABAPENTIN 300 MG PO CAPS: 300.0000 mg | ORAL_CAPSULE | Freq: Three times a day (TID) | ORAL | 1 refills | Status: DC | PRN

## 2022-04-16 MED ORDER — DICLOFENAC SODIUM 75 MG PO TBEC: 75.0000 mg | DELAYED_RELEASE_TABLET | Freq: Two times a day (BID) | ORAL | 3 refills | Status: DC | PRN

## 2022-04-16 MED ORDER — SODIUM CHLORIDE 0.9 % IV SOLN
8.0000 mg | Freq: Once | INTRAVENOUS | Status: AC
  Administered 2022-04-16: 8 mg via INTRAVENOUS
  Filled 2022-04-16: qty 4

## 2022-04-16 MED ORDER — MORPHINE SULFATE (PF) 4 MG/ML IV SOLN
4.0000 mg | Freq: Once | INTRAVENOUS | Status: AC
  Administered 2022-04-16: 4 mg via SUBCUTANEOUS
  Filled 2022-04-16: qty 1

## 2022-04-16 MED ORDER — DIPHENHYDRAMINE HCL 50 MG/ML IJ SOLN
12.5000 mg | Freq: Once | INTRAMUSCULAR | Status: AC
  Administered 2022-04-16: 12.5 mg via INTRAVENOUS
  Filled 2022-04-16: qty 1

## 2022-04-16 MED ORDER — PROMETHAZINE HCL 25 MG RE SUPP: 25.0000 mg | Freq: Four times a day (QID) | RECTAL | 0 refills | Status: DC | PRN

## 2022-04-16 MED ORDER — FENTANYL CITRATE PF 50 MCG/ML IJ SOSY
50.0000 ug | PREFILLED_SYRINGE | Freq: Once | INTRAMUSCULAR | Status: DC
  Filled 2022-04-16: qty 1

## 2022-04-16 MED ORDER — SODIUM CHLORIDE 0.9 % IV SOLN
12.5000 mg | Freq: Once | INTRAVENOUS | Status: AC
  Administered 2022-04-16: 12.5 mg via INTRAVENOUS
  Filled 2022-04-16: qty 0.5

## 2022-04-16 MED ORDER — KETOROLAC TROMETHAMINE 30 MG/ML IJ SOLN
30.0000 mg | Freq: Four times a day (QID) | INTRAMUSCULAR | Status: DC | PRN
  Administered 2022-04-16: 30 mg via INTRAMUSCULAR
  Filled 2022-04-16: qty 1

## 2022-04-16 MED ORDER — FENTANYL CITRATE (PF) 100 MCG/2ML IJ SOLN
50.0000 ug | Freq: Once | INTRAMUSCULAR | Status: AC
  Administered 2022-04-16: 50 ug via INTRAVENOUS
  Filled 2022-04-16: qty 2

## 2022-04-16 MED ORDER — FENTANYL CITRATE PF 50 MCG/ML IJ SOSY
50.0000 ug | PREFILLED_SYRINGE | Freq: Once | INTRAMUSCULAR | Status: AC
  Administered 2022-04-16: 50 ug via NASAL

## 2022-04-16 MED ORDER — SODIUM CHLORIDE 0.9 % IV BOLUS: 1000.0000 mL | Freq: Once | INTRAVENOUS | Status: DC

## 2022-04-16 MED ORDER — PROMETHAZINE HCL 25 MG/ML IJ SOLN
25.0000 mg | Freq: Four times a day (QID) | INTRAMUSCULAR | Status: DC | PRN
  Administered 2022-04-16: 25 mg via INTRAMUSCULAR
  Filled 2022-04-16: qty 1

## 2022-04-16 MED ORDER — OXYCODONE HCL 5 MG PO TABS: 5.0000 mg | ORAL_TABLET | ORAL | 0 refills | Status: DC | PRN

## 2022-04-16 MED ORDER — DIPHENHYDRAMINE HCL 50 MG/ML IJ SOLN
12.5000 mg | Freq: Once | INTRAMUSCULAR | Status: DC
  Filled 2022-04-16: qty 1

## 2022-04-16 MED ORDER — ONDANSETRON 4 MG PO TBDP
4.0000 mg | ORAL_TABLET | Freq: Once | ORAL | Status: DC
  Filled 2022-04-16: qty 1

## 2022-04-16 MED ORDER — ONDANSETRON HCL 4 MG/2ML IJ SOLN
4.0000 mg | Freq: Once | INTRAMUSCULAR | Status: AC
  Administered 2022-04-16: 4 mg via INTRAVENOUS
  Filled 2022-04-16: qty 2

## 2022-04-16 MED ORDER — ONDANSETRON 8 MG PO TBDP: 8.0000 mg | ORAL_TABLET | Freq: Three times a day (TID) | ORAL | 0 refills | Status: DC | PRN

## 2022-04-16 NOTE — ED Triage Notes (Signed)
Patient reports pain across abdomen with multiple emesis onset last Sunday , no diarrhea or fever .

## 2022-04-16 NOTE — MAU Note (Signed)
Pt has attempted to drink contrast but is is unable to keep it down. Notified provider. Will continue with orders to scan without contrast. Notifed CT dept.

## 2022-04-16 NOTE — ED Provider Notes (Signed)
Signout from Dr. Ayesha Rumpf.  32 year old female with recent D&C for pregnancy termination February 21 back here and evaluated by MAU since then.  Persistent abdominal pain now with vomiting.  Lab work still showing a positive quant although down from priors.  She is pending ultrasound and GYN evaluation. Physical Exam  BP 110/73   Pulse (!) 55   Temp (!) 97.4 F (36.3 C) (Temporal)   Resp 18   SpO2 100%   Physical Exam  Procedures  Procedures  ED Course / MDM    Medical Decision Making Amount and/or Complexity of Data Reviewed Labs: ordered. Radiology: ordered.  Risk Prescription drug management. Decision regarding hospitalization.   Ultrasound does not show IUP but still does have an adnexal mass concerning for possible ectopic.  I discussed with GYN attending Dr. Elly Modena who asked that the patient be transferred up to MAU for further evaluation.  I evaluated the patient and updated her.  She is well-appearing and is in agreement with plan for transfer up to the MAU for further evaluation.       Hayden Rasmussen, MD 04/17/22 [phone]

## 2022-04-16 NOTE — ED Notes (Signed)
Multiple attempts to get IV by this nurse. Another nurse to attempt with [place].

## 2022-04-16 NOTE — ED Provider Triage Note (Addendum)
Emergency Medicine Provider OB Triage Evaluation Note  Krista Stout is a 32 y.o. female, G3P2010, at Unknown gestation who presents to the emergency department with complaints of abdominal pain nausea vomiting  Review of  Systems  Positive: Abdominal pain, vomiting Negative: Chest pain  Physical Exam  BP 120/78 (BP Location: Right Arm)   Pulse 65   Temp 98.3 F (36.8 C) (Oral)   SpO2 100%  General: Awake, no distress  HEENT: Atraumatic  Resp: Normal effort  Cardiac: Normal rate Abd: Nondistended, nontender  MSK: Moves all extremities without difficulty Neuro: Speech clear  Medical Decision Making  Pt evaluated for pregnancy concern and is stable for transfer to MAU. Pt is in agreement with plan for transfer.  2:02 AM Discussed with MAU RN Neoma Laming, who accepts patient in transfer.  Clinical Impression  No diagnosis found.     Marcello Fennel, PA-C 04/16/22 0205    Marcello Fennel, PA-C 04/16/22 [phone]

## 2022-04-16 NOTE — MAU Note (Addendum)
Pt started to drink  her contrast after antiemetics given.

## 2022-04-16 NOTE — Discharge Instructions (Addendum)
Recommend taking your diclofenac in the morning Take the gabapentin and other nausea medications tonight You can take the oxycodone if you need to  Use heat to your left abdomen  Return for worsening abdominal pain that is not controlled by the medications, persistent vomiting and inability to tolerate PO medications, severe dizziness and lightheadedness.

## 2022-04-16 NOTE — MAU Note (Addendum)
...  Krista Stout is a 31 y.o. at Unknown here in MAU reporting: Sent over from the ED for further evaluation of potential ectopic pregnancy. She reports she had a surgical abortion on 2/21. She reports her pain had decreased a few days after and then her pains resumed on 3/15. She reports this past week she has been experiencing severe vaginal pain and pressure. She reports she attempted to have sex this past Thursday and the pain was unbearable. She reports this past Saturday her vaginal pains were severe that "pushing" was the only thing that felt better. She reports when she sits on the toilet she feels like her pelvic floor is going to give out. She reports she is also experiencing abdominal tenderness with palpation and reports touching her abdomen sends sharp pains throughout her abdomen.   Onset of complaint: Ongoing  Pain score:  10/10 vagina 10/10 lower abdomen - with palpation 7/10 upper abdomen - with palpation  Lab orders placed from triage:  none

## 2022-04-16 NOTE — ED Provider Notes (Signed)
[facility] Provider Note   CSN: [id number] Arrival date & time: 04/16/22  0105     History  Chief Complaint  Patient presents with   Abdominal Pain   Emesis    Krista Stout is a 32 y.o. female.  The history is provided by the patient and medical records.  Abdominal Pain Associated symptoms: vomiting   Emesis Associated symptoms: abdominal pain   Krista Stout is a 32 y.o. female who presents to the Emergency Department complaining of abdominal pain.  She presents to the emergency department complaining of 4 days of severe generalized abdominal pain, worse in the pelvic region.  She has associated nausea and vomiting.  No fevers.  She had a D&C performed February 21 in [facility] and required readmission for nausea and vomiting.  She later returned to [facility] and was admitted to MAU for abdominal pain, vomiting and was found to have a persistently elevated but downtrending hCG as well as a CT abdomen pelvis that demonstrated a hemorrhagic ovarian cyst.  She was improved at time of hospital discharge and then developed worsening pain and vomiting.  No vaginal discharge.  She has not had a full cycle since her D&C but did have some spotting during her hospitalization March 15 through 17.  No new sexual partners.  She does have associated dysuria.  She does have a history of cyclic vomiting syndrome.     Home Medications Prior to Admission medications   Medication Sig Start Date End Date Taking? Authorizing Provider  diclofenac 100 SUPP Place 1 suppository (100 mg total) rectally every 8 (eight) hours as needed (as needed for pain). 04/04/22   Anyanwu, Sallyanne Havers, MD  metoCLOPramide (REGLAN) 10 MG tablet Take 1 tablet (10 mg total) by mouth every 6 (six) hours. 03/06/22   Radene Gunning, MD  ondansetron (ZOFRAN-ODT) 8 MG disintegrating tablet Take 1 tablet (8 mg total) by mouth every 8 (eight) hours as needed for nausea or  vomiting. 03/11/22   Griffin Basil, MD  promethazine (PHENERGAN) 25 MG suppository Place 1 suppository (25 mg total) rectally every 6 (six) hours as needed for nausea or vomiting. 04/04/22   Anyanwu, Sallyanne Havers, MD      Allergies    Patient has no known allergies.    Review of Systems   Review of Systems  Gastrointestinal:  Positive for abdominal pain and vomiting.  All other systems reviewed and are negative.   Physical Exam Updated Vital Signs BP 122/79 (BP Location: Right Arm)   Pulse 70   Temp 98 F (36.7 C)   Resp 18   SpO2 100%  Physical Exam Vitals and nursing note reviewed.  Constitutional:      Appearance: She is well-developed.  HENT:     Head: Normocephalic and atraumatic.  Cardiovascular:     Rate and Rhythm: Normal rate and regular rhythm.  Pulmonary:     Effort: Pulmonary effort is normal.     Breath sounds: Normal breath sounds.  Abdominal:     Palpations: Abdomen is soft.     Tenderness: There is no guarding or rebound.     Comments: Moderate generalized abdominal tenderness  Musculoskeletal:        General: No tenderness.  Skin:    General: Skin is warm and dry.  Neurological:     Mental Status: She is alert and oriented to person, place, and time.  Psychiatric:  Behavior: Behavior normal.     ED Results / Procedures / Treatments   Labs (all labs ordered are listed, but only abnormal results are displayed) Labs Reviewed  URINALYSIS, ROUTINE W REFLEX MICROSCOPIC - Abnormal; Notable for the following components:      Result Value   APPearance CLOUDY (*)    Leukocytes,Ua TRACE (*)    Bacteria, UA RARE (*)    Non Squamous Epithelial 0-5 (*)    All other components within normal limits  HCG, QUANTITATIVE, PREGNANCY - Abnormal; Notable for the following components:   hCG, Beta Chain, Quant, S 44 (*)    All other components within normal limits  I-STAT BETA HCG BLOOD, ED ([facility], [facility], [facility] ONLY) - Abnormal; Notable for the following components:    I-stat hCG, quantitative 27.7 (*)    All other components within normal limits  LIPASE, BLOOD  COMPREHENSIVE METABOLIC PANEL  CBC  PREGNANCY, URINE    EKG None  Radiology No results found.  Procedures Procedures    Medications Ordered in ED Medications  promethazine (PHENERGAN) injection 25 mg (has no administration in time range)  ondansetron (ZOFRAN) injection 4 mg (4 mg Intravenous Given 04/16/22 0617)  fentaNYL (SUBLIMAZE) injection 50 mcg (50 mcg Nasal Given 04/16/22 [id number])    ED Course/ Medical Decision Making/ A&P                             Medical Decision Making Amount and/or Complexity of Data Reviewed Labs: ordered. Radiology: ordered.  Risk Prescription drug management.   Pt with hx/o CVS, recent D and C here for evaluation of generalized abdominal pain, greatest in the pelvic region with recurrent nausea and vomiting her hCG is elevated but downtrending.  Her other labs are stable.  Discussed with provider at MAU-recommends ongoing medical workup at this time as hCG is appropriately downtrending.  Plan to obtain pelvic ultrasound to evaluate for complications given her recent hemorrhagic ovarian cyst as well as D&C.  Also providing pain meds, antiemetic for symptom management.  Overall patient appears euvolemic.  Patient care transferred pending pelvic ultrasound.        Final Clinical Impression(s) / ED Diagnoses Final diagnoses:  None    Rx / DC Orders ED Discharge Orders     None         Quintella Reichert, MD 04/16/22 [id number]

## 2022-04-16 NOTE — ED Notes (Signed)
Patient transported to Ultrasound 

## 2022-04-16 NOTE — ED Notes (Signed)
IV by [place] has infiltrated. EDP made aware

## 2022-04-16 NOTE — MAU Provider Note (Signed)
History     CSN: [id number]  Arrival date and time: 04/16/22 0105  I saw the patient in the MAU at 04/16/22 starting at 11:30 AM  Chief Complaint  Patient presents with   Abdominal Pain   Emesis   Abdominal Pain Associated symptoms include diarrhea, nausea and vomiting. Pertinent negatives include no dysuria, fever, frequency or hematuria.  Emesis  Associated symptoms include abdominal pain and diarrhea. Pertinent negatives include no chest pain, chills, coughing, dizziness or fever.   Patient is 32 y.o. G3P2010 here with complaints of abdominal pain and vaginal pain that is acut in onset in the setting of a complex pregnancy situation  2/16 - US showed IUP at [redacted]w[redacted]d 2/21-2/28 Admitted with severe HEG, required PICC line and TPN.She was discharged and went to [place] for a termination.   2/28 - Surgical Termination, D&C in [place] Reports no pain and resumed sexual acitivity about 1 week after the procedure and was doing well. She had acute onset of sharp abdominal pain starting in mid march.   3/15 Seen at ER-- with left adnexa mass and moderate free fluid in low pelvis concerning for ruptured ectopic-- admitted.  3/15- 3/21 Admitted to [facility] Course Patient was admitted on 03/28/21 for abdominal pain, acute on chronic n/v, left hemorrhagic cyst with hemoperitoneum. ED work up showed HCG of 715 and CT with hemoperitoneum and 5x5cm left adnexal complex mass with ?small extravasation. U/s showed normal uterus, ES of 32mm and 4cm left adnexal mass next to ovary.  The diagnosis was of hemoperitoneum from ruptured hemorrhagic ovarian cyst and acute on chronic vomiting. The nausea and vomiting became intractable despite aggressive treatments with antiemetics, fluids and electrolyte repletion.  GI was consulted on 04/01/22.  CT of abdomen/pelvis showed no evidence of gastric issues, normal esophagus, normal pancreas, normal intestines. Labs without leukocytosis, no elevation  of BUN.  Normal electrolytes. Of note, she had a previous EGD and GES 2022 for workup. GI consult felt that continued antiemetic treatment was recommended, no need for endoscopy at this time. Treatments with various antiemetics continued and she slowly started to feel better.  Her diet was advanced as tolerated, she was tolerating regular diet by the time of discharge.  As for her abdominal pain, that improved during admission and was managed on oral medications. Her HCG levels were noted to be declining appropriately s/p her surgical abortion and she had expected post-abortion vaginal bleeding with a stable hemoglobin of 10.8.  On 04/04/22, she was deemed stable for discharge to home with outpatient follow up.  3/21 until 3/29-- Patient was without NV and only having intermittent abdominal pain that was managed with minimal tylenol usage. She had penetrative sex during that time that was comfortable.  3/29 Friday 3/29 until today 4/2  Acute onset of lower abdominal pain that had worsened over the past 5 days with vaginal pressure and rectal pain. The pain at rest is 7/10 and then worsens to 10/10 with movements and then will persist for 45 minutes at this level. She reports aggravating movements are sitting on her perineum, bending over.  She reports diarrhea over the weekend. Also reports nausea and trying to take pills has been hard. She reports inability to take her nausea medications due to pain. She localizes the pain mostly to her vagina and describes that holding and "carrying around her vagina and rectum" provides relief. She reports the pain will worsen her vomiting/nausea. She reports no problems with urination, pain with urination.  She has had BMs that have been diarrhea but no constipation. No blood in urine or stool. She reports +painful sex on Sunday with her partner-- she was only able to tolerate the tip on her husband's penis and had to stop intercourse.     OB History     Gravida  3    Para  2   Term  2   Preterm      AB  1   Living  2      SAB      IAB  1   Ectopic      Multiple      Live Births  2           Past Medical History:  Diagnosis Date   Asthma    Cyclical vomiting syndrome    History of feeding tube 2021   [facility] hospital in [place]    Past Surgical History:  Procedure Laterality Date   INDUCED ABORTION  03/12/2022   LAPAROSCOPIC OOPHERECTOMY Right 2022   In [place]. Patient states fibroid had right ovary encased and both were removed; unsure if tube was removed, too   [place]     w/ mesh    No family history on file.  Social History   Tobacco Use   Smoking status: Former   Smokeless tobacco: Never  Scientific laboratory technician Use: Never used  Substance Use Topics   Alcohol use: No   Drug use: No    Types: Marijuana    Comment: former    Allergies: No Known Allergies  Medications Prior to Admission  Medication Sig Dispense Refill Last Dose   diclofenac 100 SUPP Place 1 suppository (100 mg total) rectally every 8 (eight) hours as needed (as needed for pain). 20 suppository 0    metoCLOPramide (REGLAN) 10 MG tablet Take 1 tablet (10 mg total) by mouth every 6 (six) hours. 30 tablet 0    ondansetron (ZOFRAN-ODT) 8 MG disintegrating tablet Take 1 tablet (8 mg total) by mouth every 8 (eight) hours as needed for nausea or vomiting. 20 tablet 0    promethazine (PHENERGAN) 25 MG suppository Place 1 suppository (25 mg total) rectally every 6 (six) hours as needed for nausea or vomiting. 20 each 0     Review of Systems  Constitutional:  Negative for chills and fever.  HENT:  Negative for congestion and sore throat.   Eyes:  Negative for pain and visual disturbance.  Respiratory:  Negative for cough, chest tightness and shortness of breath.   Cardiovascular:  Negative for chest pain.  Gastrointestinal:  Positive for abdominal pain, diarrhea, nausea and vomiting.  Endocrine: Negative for cold intolerance and  heat intolerance.  Genitourinary:  Positive for dyspareunia. Negative for dysuria, flank pain, frequency and hematuria.  Musculoskeletal:  Negative for back pain.  Skin:  Negative for rash.  Allergic/Immunologic: Negative for food allergies.  Neurological:  Negative for dizziness and light-headedness.  Psychiatric/Behavioral:  Negative for agitation.    Physical Exam   Blood pressure 110/73, pulse (!) 55, temperature (!) 97.4 F (36.3 C), temperature source Temporal, resp. rate 18, SpO2 100 %, unknown if currently breastfeeding.  Physical Exam Vitals and nursing note reviewed. Exam conducted with a chaperone present.  Constitutional:      General: She is not in acute distress.    Appearance: She is well-developed.     Comments: Pregnant female  HENT:     Head: Normocephalic and atraumatic.  Eyes:  General: No scleral icterus.    Conjunctiva/sclera: Conjunctivae normal.  Cardiovascular:     Rate and Rhythm: Normal rate.  Pulmonary:     Effort: Pulmonary effort is normal.  Chest:     Chest wall: No tenderness.  Abdominal:     Palpations: Abdomen is soft.     Tenderness: There is no abdominal tenderness. There is no guarding or rebound.     Hernia: There is no hernia in the left inguinal area or right inguinal area.  Genitourinary:    Exam position: Lithotomy position.     Pubic Area: No rash.      Labia:        Right: No rash, tenderness or lesion.        Left: No rash, tenderness or lesion.      Vagina: No foreign body. Tenderness present. No vaginal discharge, erythema or bleeding.     Cervix: Erythema present. No cervical motion tenderness, discharge, friability or cervical bleeding.     Uterus: Tender.      Adnexa:        Right: Tenderness present.        Left: Tenderness (typical adnexal exam cause patient significant pain and patient had nausea/vomiting that was induced by the adnexal exam on this side.) and fullness present.      Rectum: Normal.   Musculoskeletal:        General: Normal range of motion.     Cervical back: Normal range of motion and neck supple.  Lymphadenopathy:     Lower Body: No right inguinal adenopathy. No left inguinal adenopathy.  Skin:    General: Skin is warm and dry.     Findings: No rash.  Neurological:     Mental Status: She is alert and oriented to person, place, and time.    MAU Course  Procedures   MDM: high  This patient presents to the ED for concern of   Chief Complaint  Patient presents with   Abdominal Pain   Emesis     These complains involves an extensive number of treatment options, and is a complaint that carries with it a high risk of complications and morbidity.  The differential diagnosis for  1. Abdominal pain with positive pregnancy test INCLUDES new pregnancy that is ectopic, persistent hemorrhagic cyst,  constipation, appendicitis, intraabdominal infection. Less likely infections given lack of WBC. Patient history if also not fully consistent with ectopic. I think this is most likely her hemorrhagic cyst that is persistent    Co morbidities that complicate the patient evaluation: Recent TAB 2/28.   External records from outside source obtained and reviewed including Prenatal care records  Lab Tests: UA, Urine Culture, CMP, and CBC  I ordered, and personally interpreted labs.  The pertinent results include:   Results for orders placed or performed during the hospital encounter of 04/16/22 (from the past 24 hour(s))  Urinalysis, Routine w reflex microscopic -Urine, Clean Catch     Status: Abnormal   Collection Time: 04/16/22  1:12 AM  Result Value Ref Range   Color, Urine YELLOW YELLOW   APPearance CLOUDY (A) CLEAR   Specific Gravity, Urine 1.021 1.005 - 1.030   pH 5.0 5.0 - 8.0   Glucose, UA NEGATIVE NEGATIVE mg/dL   Hgb urine dipstick NEGATIVE NEGATIVE   Bilirubin Urine NEGATIVE NEGATIVE   Ketones, ur NEGATIVE NEGATIVE mg/dL   Protein, ur NEGATIVE NEGATIVE mg/dL    Nitrite NEGATIVE NEGATIVE   Leukocytes,Ua TRACE (A) NEGATIVE  RBC / HPF 0-5 0 - 5 RBC/hpf   WBC, UA 6-10 0 - 5 WBC/hpf   Bacteria, UA RARE (A) NONE SEEN   Squamous Epithelial / HPF 21-50 0 - 5 /HPF   Mucus PRESENT    Non Squamous Epithelial 0-5 (A) NONE SEEN  Pregnancy, urine     Status: None   Collection Time: 04/16/22  1:12 AM  Result Value Ref Range   Preg Test, Ur NEGATIVE NEGATIVE  Lipase, blood     Status: None   Collection Time: 04/16/22  1:24 AM  Result Value Ref Range   Lipase 32 11 - 51 U/L  Comprehensive metabolic panel     Status: None   Collection Time: 04/16/22  1:24 AM  Result Value Ref Range   Sodium 138 135 - 145 mmol/L   Potassium 3.7 3.5 - 5.1 mmol/L   Chloride 103 98 - 111 mmol/L   CO2 22 22 - 32 mmol/L   Glucose, Bld 92 70 - 99 mg/dL   BUN 11 6 - 20 mg/dL   Creatinine, Ser 0.74 0.44 - 1.00 mg/dL   Calcium 9.0 8.9 - 10.3 mg/dL   Total Protein 7.1 6.5 - 8.1 g/dL   Albumin 3.7 3.5 - 5.0 g/dL   AST 21 15 - 41 U/L   ALT 19 0 - 44 U/L   Alkaline Phosphatase 61 38 - 126 U/L   Total Bilirubin 0.8 0.3 - 1.2 mg/dL   GFR, Estimated >60 >60 mL/min   Anion gap 13 5 - 15  CBC     Status: None   Collection Time: 04/16/22  1:24 AM  Result Value Ref Range   WBC 6.2 4.0 - 10.5 K/uL   RBC 3.94 3.87 - 5.11 MIL/uL   Hemoglobin 12.3 12.0 - 15.0 g/dL   HCT 37.7 36.0 - 46.0 %   MCV 95.7 80.0 - 100.0 fL   MCH 31.2 26.0 - 34.0 pg   MCHC 32.6 30.0 - 36.0 g/dL   RDW 13.4 11.5 - 15.5 %   Platelets 331 150 - 400 K/uL   nRBC 0.0 0.0 - 0.2 %  I-Stat beta hCG blood, ED     Status: Abnormal   Collection Time: 04/16/22  1:38 AM  Result Value Ref Range   I-stat hCG, quantitative 27.7 (H) <5 mIU/mL   Comment 3          hCG, quantitative, pregnancy     Status: Abnormal   Collection Time: 04/16/22  5:54 AM  Result Value Ref Range   hCG, Beta Chain, Quant, S 44 (H) <5 mIU/mL  Wet prep, genital     Status: None   Collection Time: 04/16/22 12:53 PM   Specimen: PATH Cytology  Cervicovaginal Ancillary Only  Result Value Ref Range   Yeast Wet Prep HPF POC NONE SEEN NONE SEEN   Trich, Wet Prep NONE SEEN NONE SEEN   Clue Cells Wet Prep HPF POC NONE SEEN NONE SEEN   WBC, Wet Prep HPF POC <10 <10   Sperm NONE SEEN      Imaging Studies ordered:  I ordered imaging studies includingOther CT abdomen  I independently visualized and interpreted imaging which showed Left complex mass.  I agree with the radiologist interpretation Review of previous results  3/15 [place] at MAU  IMPRESSION: 1. Heterogeneously echogenic mass of the left adnexa adjacent to the left ovary measuring 4.2 cm, highly concerning for ruptured ectopic pregnancy given the presence of elevated beta hCG and findings of  both CT and ultrasound. Note however that the patient had an intrauterine pregnancy on ultrasound examination dated 03/01/2022, with recent report of surgical abortion. No imaging evidence of heterotopic pregnancy on prior examination. 2. Moderate volume of heterogeneous free fluid in the low pelvis, concerning for hemoperitoneum. 3.  No candidate intrauterine pregnancy. 4.  Right ovary not visualized by ultrasound.   These results were called by telephone at the time of interpretation on 03/29/2022 at 8:01 pm to Dr. Carmin Muskrat , who verbally acknowledged these results.    CT ABDOMEN PELVIS WO CONTRAST  Result Date: 04/16/2022 CLINICAL DATA:  Right lower quadrant abdominal pain. Surgical abortion on 02/21. This past week she has been experiencing severe vaginal pain and pressure. Beta HCG decreasing as expected for postabortion. Pregnancy waiver signed. EXAM: CT ABDOMEN AND PELVIS WITHOUT CONTRAST TECHNIQUE: Multidetector CT imaging of the abdomen and pelvis was performed following the standard protocol without IV contrast. RADIATION DOSE REDUCTION: This exam was performed according to the departmental dose-optimization program which includes automated exposure control, adjustment  of the mA and/or kV according to patient size and/or use of iterative reconstruction technique. COMPARISON:  CT abdomen and pelvis 03/29/2022 FINDINGS: Lower chest: No acute abnormality. Hepatobiliary: No focal liver abnormality is seen. No gallstones, gallbladder wall thickening, or biliary dilatation. Pancreas: Unremarkable. No pancreatic ductal dilatation or surrounding inflammatory changes. Spleen: Unremarkable. Adrenals/Urinary Tract: Normal noncontrast appearance of the adrenal glands and kidneys. No hydronephrosis or urinary calculi. Unremarkable bladder. Stomach/Bowel: Normal caliber large and small bowel. Normal appendix. Unremarkable stomach. Vascular/Lymphatic: No significant vascular findings are present. No enlarged abdominal or pelvic lymph nodes. Reproductive: Ill-defined heterogenous mass in the left adnexa appears grossly similar to 03/29/2022 given lack of IV contrast in the current examination. This was evaluated with ultrasound earlier today with findings concerning for persistent ectopic pregnancy. The uterus is poorly evaluated without IV contrast. Unremarkable right adnexa. Other: Small amount of free fluid in the pelvis. No free intraperitoneal air. Musculoskeletal: No acute fracture. IMPRESSION: 1. Ill-defined heterogenous mass in the left adnexa appears grossly similar to CT 03/29/2022 given lack of IV contrast in the current examination. This was evaluated with ultrasound earlier today with findings concerning for persistent ectopic pregnancy. Differential considerations include a hemorrhagic cyst. Electronically Signed   By: Placido Sou M.D.   On: 04/16/2022 18:34   US OB LESS THAN 14 WEEKS WITH OB TRANSVAGINAL  Result Date: 04/16/2022 CLINICAL DATA:  Abdominal pain nausea and vomiting for several days. 1st trimester pregnancy. EXAM: OBSTETRIC <14 WK ULTRASOUND TECHNIQUE: Transabdominal ultrasound was performed for evaluation of the gestation as well as the maternal uterus and  adnexal regions. COMPARISON:  03/29/2022 FINDINGS: Intrauterine gestational sac: None Maternal uterus/adnexae: Retroverted uterus. Endometrial stripe measures 9 mm. No fibroids identified. Both ovaries are normal in appearance. A heterogeneous mass is again seen in the left adnexa adjacent to the left ovary. This measures 4.0 x 2.5 x 3.3 cm, without significant change since prior exam. Previously seen complex free fluid has nearly completely resolved since prior exam. IMPRESSION: No IUP visualized. Persistent 4 cm heterogeneous left adnexal mass, without significant change since prior study. Previously seen complex free fluid has nearly completely resolved. This remains highly suspicious for ectopic pregnancy. Critical Value/emergent results were called by telephone at the time of interpretation on 04/16/2022 at 9:04 am to provider Dr. Melina Copa in the ED, who verbally acknowledged these results. Electronically Signed   By: Marlaine Hind M.D.   On: 04/16/2022 09:10  Medicines ordered and prescription drug management:  Medications: Fentanyl, Phenergan, Zofran, Toradol, Zofran, Benadryl   Reevaluation of the patient after these medicines showed that the patient improved   I have reviewed the patients home medicines and have made adjustments as needed  Test Considered: MRI  Critical Interventions: IV fluids and Other Pain medications  Consultations Obtained:  I requested consultation with the [facility],  and discussed lab and imaging findings as well as pertinent plan - they recommend: treating outpatient for hemorrhagic cyst  MAU Course: 17:00 Informed by RN that patient was not tolerating PO contrast. She discussed with RT. OK to proceed with CT. I do  not think IV contrast is warranted at this point.   19:00 PM Discussed with patient result of CT scan and clinical course so far. She is on all fours/left lateral. Not vomiting. Continues to endorse pain and nausea. Discussed with patient  my desire to discuss with Dr. Elly Modena to review her care and history.   19:36  PM Consulted with Dr Mora Bellman and reviewed history in detail. Reviewed the trending of BHCG with the additional information regarding TAB on 2/28 of know IUP.  Additionally the free fluid that was in the pelvis on 3/15 is no longer visualize. Discussed management of hemorrhagic cysts with Dr. Elly Modena.   9:01 PM Revaluation. Patient continues to have pain and nausea. I reviewed with her my extensive review of imaging and history of Dr. Elly Modena and our joint opinion that this is NOT likely an ectopic pregnancy. Her pain remains moderately controlled. She is concerned about going home   10:04 PM Prior to discharge patient was given Toradol, Fentanyl, Zofran, Benadryl.  Discussed returning for worsening symptoms  After the interventions noted above, I reevaluated the patient and found that they have :improved  Dispostion: discharged    Assessment and Plan   1. Pelvic pain in female   2. Positive pregnancy test   3. Hemorrhagic cyst of left ovary   4. Hemorrhagic cyst of ovary    - This is not consistent with ectopic. Her pain is likely from a hemorrhagic cyst and her BHCG is resolution of her pregnancy from her 2/28 D&C.  - rx for diclofenac, gabapentin and oyxocodone #10 tabs sent to [facility] on [address] - If patient returns recommend discussing cystectomy with GYN provider as this would be the 3rd ER presentation for similar concerns - Discharged home   Allergies as of 04/16/2022   No Known Allergies      Medication List     STOP taking these medications    diclofenac 100 Supp       TAKE these medications    diclofenac 75 MG EC tablet Commonly known as: VOLTAREN Take 1 tablet (75 mg total) by mouth 2 (two) times daily as needed.   gabapentin 300 MG capsule Commonly known as: NEURONTIN Take 1 capsule (300 mg total) by mouth 3 (three) times daily as needed.   metoCLOPramide 10 MG  tablet Commonly known as: REGLAN Take 1 tablet (10 mg total) by mouth every 6 (six) hours.   ondansetron 8 MG disintegrating tablet Commonly known as: ZOFRAN-ODT Take 1 tablet (8 mg total) by mouth every 8 (eight) hours as needed for nausea or vomiting.   oxyCODONE 5 MG immediate release tablet Commonly known as: Roxicodone Take 1 tablet (5 mg total) by mouth every 4 (four) hours as needed for severe pain.   promethazine 25 MG suppository Commonly known as: PHENERGAN Place 1  suppository (25 mg total) rectally every 6 (six) hours as needed for nausea or vomiting.        Juanita Craver [facility] 04/16/2022, 10:04 AM

## 2022-04-17 LAB — GC/CHLAMYDIA PROBE AMP (~~LOC~~) NOT AT ARMC
Chlamydia: NEGATIVE
Comment: NEGATIVE
Comment: NORMAL
Neisseria Gonorrhea: NEGATIVE

## 2022-04-18 ENCOUNTER — Other Ambulatory Visit: Payer: Self-pay

## 2022-04-18 ENCOUNTER — Telehealth: Payer: Self-pay

## 2022-04-18 NOTE — Progress Notes (Deleted)
Called pt to follow up on missed visit for stat HCG. Pt states she was not aware of appt and is taking care of her daughters today. Has scheduling conflict tomorrow AM so will need appt in the PM. Chart review shows appt tomorrow is with Ervin at [facility]. Reviewed with Ernestina Patches who states patient may follow up there.

## 2022-04-18 NOTE — Telephone Encounter (Signed)
Called pt to follow up on missed visit for stat HCG. Pt states she was not aware of appt and is taking care of her daughters today. Has scheduling conflict tomorrow AM so will need appt in the PM. Chart review shows appt tomorrow is with Ervin at [facility]. Reviewed with Ernestina Patches who states patient may follow up there.

## 2022-04-19 ENCOUNTER — Ambulatory Visit: Payer: Medicaid - Out of State | Admitting: Obstetrics and Gynecology

## 2022-04-26 ENCOUNTER — Inpatient Hospital Stay (HOSPITAL_COMMUNITY)
Admission: AD | Admit: 2022-04-26 | Discharge: 2022-04-26 | Discharge status: Against Medical Advice | Payer: Medicaid - Out of State | Attending: Obstetrics and Gynecology | Admitting: Obstetrics and Gynecology

## 2022-04-26 ENCOUNTER — Encounter (HOSPITAL_COMMUNITY): Payer: Self-pay

## 2022-04-26 ENCOUNTER — Inpatient Hospital Stay (HOSPITAL_COMMUNITY): Payer: Medicaid - Out of State

## 2022-04-26 ENCOUNTER — Other Ambulatory Visit: Payer: Self-pay

## 2022-04-26 ENCOUNTER — Inpatient Hospital Stay (EMERGENCY_DEPARTMENT_HOSPITAL)
Admission: AD | Admit: 2022-04-26 | Discharge: 2022-04-26 | Disposition: A | Discharge status: Home / Self Care | Payer: Medicaid - Out of State | Source: Home / Self Care | Attending: Obstetrics and Gynecology | Admitting: Obstetrics and Gynecology

## 2022-04-26 DIAGNOSIS — N83202 Unspecified ovarian cyst, left side: Secondary | ICD-10-CM | POA: Insufficient documentation

## 2022-04-26 DIAGNOSIS — R102 Pelvic and perineal pain: Secondary | ICD-10-CM | POA: Diagnosis not present

## 2022-04-26 DIAGNOSIS — R1900 Intra-abdominal and pelvic swelling, mass and lump, unspecified site: Secondary | ICD-10-CM

## 2022-04-26 LAB — WET PREP, GENITAL
Clue Cells Wet Prep HPF POC: NONE SEEN
Sperm: NONE SEEN
Trich, Wet Prep: NONE SEEN
WBC, Wet Prep HPF POC: <10 (ref <10)
Yeast Wet Prep HPF POC: NONE SEEN

## 2022-04-26 LAB — COMPREHENSIVE METABOLIC PANEL
ALT: 31 U/L (ref 0–44)
AST: 33 U/L (ref 15–41)
Albumin: 3.8 g/dL (ref 3.5–5.0)
Alkaline Phosphatase: 68 U/L (ref 38–126)
Anion gap: 8 (ref 5–15)
BUN: 6 mg/dL (ref 6–20)
CO2: 25 mmol/L (ref 22–32)
Calcium: 9.3 mg/dL (ref 8.9–10.3)
Chloride: 103 mmol/L (ref 98–111)
Creatinine, Ser: 0.66 mg/dL (ref 0.44–1.00)
GFR, Estimated: >60 mL/min (ref >60)
Glucose, Bld: 85 mg/dL (ref 70–99)
Potassium: 3.6 mmol/L (ref 3.5–5.1)
Sodium: 136 mmol/L (ref 135–145)
Total Bilirubin: 0.6 mg/dL (ref 0.3–1.2)
Total Protein: 7.3 g/dL (ref 6.5–8.1)

## 2022-04-26 LAB — TYPE AND SCREEN
ABO/RH(D): O POS
Antibody Screen: NEGATIVE

## 2022-04-26 LAB — CBC
HCT: 38 % (ref 36.0–46.0)
Hemoglobin: 12.4 g/dL (ref 12.0–15.0)
MCH: 30.4 pg (ref 26.0–34.0)
MCHC: 32.6 g/dL (ref 30.0–36.0)
MCV: 93.1 fL (ref 80.0–100.0)
Platelets: 273 10*3/uL (ref 150–400)
RBC: 4.08 MIL/uL (ref 3.87–5.11)
RDW: 13.2 % (ref 11.5–15.5)
WBC: 5.3 10*3/uL (ref 4.0–10.5)
nRBC: 0 % (ref 0.0–0.2)

## 2022-04-26 LAB — HCG, QUANTITATIVE, PREGNANCY: hCG, Beta Chain, Quant, S: 9 m[IU]/mL — ABNORMAL HIGH (ref <5)

## 2022-04-26 MED ORDER — HYDROMORPHONE HCL 1 MG/ML IJ SOLN
1.0000 mg | Freq: Once | INTRAMUSCULAR | Status: AC
  Administered 2022-04-26: 1 mg via INTRAMUSCULAR
  Filled 2022-04-26: qty 1

## 2022-04-26 MED ORDER — MEGESTROL ACETATE 40 MG PO TABS: 40.0000 mg | ORAL_TABLET | Freq: Every day | ORAL | 2 refills | Status: DC

## 2022-04-26 MED ORDER — KETOROLAC TROMETHAMINE 30 MG/ML IJ SOLN
30.0000 mg | Freq: Once | INTRAMUSCULAR | Status: AC
  Administered 2022-04-26: 30 mg via INTRAMUSCULAR
  Filled 2022-04-26: qty 1

## 2022-04-26 NOTE — MAU Note (Signed)
RN went to retrieve patient from lobby for Triage. MAU Registration stated patient had notified them that she was going home to let someone in her home. Cleone Slim, CNM, notified.   AMA charge applied. Patient discharged off of board. Instructed MAU Registration that patient would have to resign into MAU if she returns.

## 2022-04-26 NOTE — MAU Note (Signed)
.  Krista Stout is a 32 y.o. here in MAU reporting: lower pelvic pain that is getting worse and "feels like her vagina is coming out". She states it is worse when she is trying to have a bowel movement or void. Denies constipation or diarrhea. She reports that the pain is so bad that it makes her nauseated.   Pain score: 9 Vitals:   04/26/22 1836  BP: (!) 141/81  Pulse: 84  Resp: 14  Temp: 98.4 F (36.9 C)  SpO2: 100%

## 2022-04-26 NOTE — ED Triage Notes (Signed)
Pt came in via POV d/t abd pain that began 1 week ago & worsened the past 2 days. States its felt not only in her abd but down in her pelvic floor & lower back, A/Ox4. LMP in January & had an elective abortion in February. Rates pain 9/10 while in triage, denies any recent vaginal bleeding.

## 2022-04-26 NOTE — ED Provider Triage Note (Signed)
Emergency Medicine Provider OB Triage Evaluation Note  Krista Stout is a 32 y.o. female, [id number].  Patient presents today with pelvic pain.  She was recently seen 10 days ago and noted to have a potential ectopic pregnancy.  She was then seen at the MAU and they believed it may be was just a hemorrhagic cyst.  Patient was treated with oxycodone but she continues to have severe pain that she thinks is worsening.  Associated nausea and vomiting.  Review of  Systems  Positive:  Negative:   Physical Exam  BP 122/85 (BP Location: Right Arm)   Pulse 67   Temp 98.7 F (37.1 C)   Resp 16   SpO2 100%  General: Awake, no distress  HEENT: Atraumatic  Resp: Normal effort  Cardiac: Normal rate Abd: Nondistended, nontender  MSK: Moves all extremities without difficulty Neuro: Speech clear  Medical Decision Making  Pt evaluated for pregnancy concern and is stable for transfer to MAU. Pt is in agreement with plan for transfer.  2:05 PM Discussed with MAU APP, Krista Stout, who accepts patient in transfer.  Clinical Impression  No diagnosis found.     Krista Benders, PA-C 04/26/22 1406

## 2022-04-26 NOTE — MAU Provider Note (Cosign Needed Addendum)
History     CSN: [id number]  Arrival date and time: 04/26/22 1812   Event Date/Time   First Provider Initiated Contact with Patient 04/26/22 1834      No chief complaint on file.  HPI  Krista Stout is a 32 y.o. [id number] who presents for evaluation of pelvic pain. Patient reports the pain has been going since she was initially seen in mid-March. She states she was seen on 4/2 and told that the pain was from a hemorrhagic cyst and should improve with time. She reports she is still taking the pain medication with minimal to no relief. She states it is a pressure and fullness in her pelvis like her vagina is about to fall out. Patient rates the pain as a 10/10 and has tried oxycodone for the pain. She denies any vaginal bleeding or discharge. She is not sexually active.   OB History     Gravida  3   Para  2   Term  2   Preterm      AB  1   Living  2      SAB      IAB  1   Ectopic      Multiple      Live Births  2           Past Medical History:  Diagnosis Date   Asthma    Cyclical vomiting syndrome    History of feeding tube 2021   [facility] hospital in [place]    Past Surgical History:  Procedure Laterality Date   INDUCED ABORTION  03/12/2022   LAPAROSCOPIC OOPHERECTOMY Right 2022   In [place]. Patient states fibroid had right ovary encased and both were removed; unsure if tube was removed, too   UMBILICAL HERNIA REPAIR     w/ mesh    No family history on file.  Social History   Tobacco Use   Smoking status: Former   Smokeless tobacco: Never  Building services engineer Use: Never used  Substance Use Topics   Alcohol use: No   Drug use: No    Types: Marijuana    Comment: former    Allergies: No Known Allergies  Medications Prior to Admission  Medication Sig Dispense Refill Last Dose   ondansetron (ZOFRAN-ODT) 8 MG disintegrating tablet Take 1 tablet (8 mg total) by mouth every 8 (eight) hours as needed for nausea or vomiting. 20 tablet 0  04/26/2022   oxyCODONE (ROXICODONE) 5 MG immediate release tablet Take 1 tablet (5 mg total) by mouth every 4 (four) hours as needed for severe pain. 10 tablet 0 04/26/2022   promethazine (PHENERGAN) 25 MG suppository Place 1 suppository (25 mg total) rectally every 6 (six) hours as needed for nausea or vomiting. 20 each 0 04/26/2022   diclofenac (VOLTAREN) 75 MG EC tablet Take 1 tablet (75 mg total) by mouth 2 (two) times daily as needed. 60 tablet 3    gabapentin (NEURONTIN) 300 MG capsule Take 1 capsule (300 mg total) by mouth 3 (three) times daily as needed. 30 capsule 1    metoCLOPramide (REGLAN) 10 MG tablet Take 1 tablet (10 mg total) by mouth every 6 (six) hours. (Patient not taking: Reported on 04/16/2022) 30 tablet 0     Review of Systems  Constitutional: Negative.  Negative for fatigue and fever.  HENT: Negative.    Respiratory: Negative.  Negative for shortness of breath.   Cardiovascular: Negative.  Negative for chest pain.  Gastrointestinal: Negative.  Negative for abdominal pain, constipation, diarrhea, nausea and vomiting.  Genitourinary:  Positive for pelvic pain. Negative for dysuria, vaginal bleeding and vaginal discharge.  Neurological: Negative.  Negative for dizziness and headaches.   Physical Exam   Blood pressure (!) 141/81, pulse 84, temperature 98.4 F (36.9 C), temperature source Oral, resp. rate 14, weight 67.7 kg, SpO2 100 %, unknown if currently breastfeeding.  Patient Vitals for the past 24 hrs:  BP Temp Temp src Pulse Resp SpO2 Weight  04/26/22 1836 (!) 141/81 98.4 F (36.9 C) Oral 84 14 100 % 67.7 kg    Physical Exam Vitals and nursing note reviewed.  Constitutional:      General: She is not in acute distress.    Appearance: She is well-developed.  HENT:     Head: Normocephalic.  Eyes:     Pupils: Pupils are equal, round, and reactive to light.  Cardiovascular:     Rate and Rhythm: Normal rate and regular rhythm.     Heart sounds: Normal heart  sounds.  Pulmonary:     Effort: Pulmonary effort is normal. No respiratory distress.     Breath sounds: Normal breath sounds.  Abdominal:     General: Bowel sounds are normal. There is no distension.     Palpations: Abdomen is soft.     Tenderness: There is no abdominal tenderness.  Skin:    General: Skin is warm and dry.  Neurological:     Mental Status: She is alert and oriented to person, place, and time.     Motor: No abnormal muscle tone.     Coordination: Coordination normal.     Deep Tendon Reflexes: Reflexes are normal and symmetric. Reflexes normal.  Psychiatric:        Mood and Affect: Mood normal.        Behavior: Behavior normal.        Thought Content: Thought content normal.        Judgment: Judgment normal.     MAU Course  Procedures  Results for orders placed or performed during the hospital encounter of 04/26/22 (from the past 24 hour(s))  Type and screen [facility]     Status: None   Collection Time: 04/26/22  2:52 PM  Result Value Ref Range   ABO/RH(D) O POS    Antibody Screen NEG    Sample Expiration      04/29/2022,2359 Performed at [facility], [address], [place], [place] [address]   CBC     Status: None   Collection Time: 04/26/22  2:58 PM  Result Value Ref Range   WBC 5.3 4.0 - 10.5 K/uL   RBC 4.08 3.87 - 5.11 MIL/uL   Hemoglobin 12.4 12.0 - 15.0 g/dL   HCT 60.4 54.0 - 98.1 %   MCV 93.1 80.0 - 100.0 fL   MCH 30.4 26.0 - 34.0 pg   MCHC 32.6 30.0 - 36.0 g/dL   RDW 19.1 47.8 - 29.5 %   Platelets 273 150 - 400 K/uL   nRBC 0.0 0.0 - 0.2 %  hCG, quantitative, pregnancy     Status: Abnormal   Collection Time: 04/26/22  2:58 PM  Result Value Ref Range   hCG, Beta Chain, Quant, S 9 (H) <5 mIU/mL  Comprehensive metabolic panel     Status: None   Collection Time: 04/26/22  2:58 PM  Result Value Ref Range   Sodium 136 135 - 145 mmol/L   Potassium 3.6 3.5 - 5.1  mmol/L   Chloride 103 98 - 111 mmol/L   CO2 25 22 - 32  mmol/L   Glucose, Bld 85 70 - 99 mg/dL   BUN 6 6 - 20 mg/dL   Creatinine, Ser 9.14 0.44 - 1.00 mg/dL   Calcium 9.3 8.9 - 78.2 mg/dL   Total Protein 7.3 6.5 - 8.1 g/dL   Albumin 3.8 3.5 - 5.0 g/dL   AST 33 15 - 41 U/L   ALT 31 0 - 44 U/L   Alkaline Phosphatase 68 38 - 126 U/L   Total Bilirubin 0.6 0.3 - 1.2 mg/dL   GFR, Estimated >95 >62 mL/min   Anion gap 8 5 - 15     US OB LESS THAN 14 WEEKS WITH OB TRANSVAGINAL  Result Date: 04/26/2022 CLINICAL DATA:  Pelvic pain EXAM: OBSTETRIC <14 WK [place] AND TRANSVAGINAL OB US TECHNIQUE: Both transabdominal and transvaginal ultrasound examinations were performed for complete evaluation of the gestation as well as the maternal uterus, adnexal regions, and pelvic cul-de-sac. Transvaginal technique was performed to assess early pregnancy. COMPARISON:  CT 04/16/2022, 03/29/2022, ultrasound 04/16/2022, 03/29/2022 FINDINGS: Intrauterine gestational sac: Not visualized Yolk sac:  Not visualized Embryo:  Not visualized Maternal uterus/adnexae: 2.7 x 2.1 x 2.4 cm complex cystic area at the right adnexa with homogeneous low level echoes. No internal flow. Left ovary measures 6.5 x 6.2 x 6.5 cm and demonstrates several adjacent versus single larger septated cyst measuring up to 6.5 cm. On trans abdominal images a heterogeneous left adnexal mass is visualized, it measures 5.5 x 3.5 x 3.5 cm, previously 4 x 2.5 x 3.3 cm. Trace free fluid. IMPRESSION: 1. No IUP identified. Persistent heterogenous left adnexal mass, similar if not slightly increased in size compared to most recent prior ultrasound. This was previously thought concerning for left ectopic, though the patient's HCG is declining. This could represent slowly resolving adnexal hematoma. There is interval increase in size of left ovarian cysts, now measuring up to 6.5 cm. Given persistence of left adnexal finding, consider surgical consultation. 2. Suspicion of complex cyst in the right ovary with appearance  suggesting either hemorrhagic cyst or endometrioma. 6-12 week sonographic follow-up suggested for this finding. Electronically Signed   By: Jasmine Pang M.D.   On: 04/26/2022 20:38     MDM Labs ordered and reviewed.   CBC, CMP, HCG Toradol IM Wet prep and gc/chlamydia  CNM consulted with Dr. Berton Lan regarding presentation and results- MD recommends pelvic ultrasound  US Pelvis Comp  2030: Pain reassessment after u/s- patient reports no improvement of pain. Still 10/10 Dilaudid IM  2042: CNM consulted with Dr. Despina Hidden regarding presentation and results of ultrasound- MD recommends adding pelvic dopplers to ultrasound to rule out torsion  Patient updated on plan of care and agreeable to repeat ultrasound  US Pelvic Dopplers (R/O Torsion)  Care turned over to Sabas Sous CNM at 2100. Rolm Bookbinder, CNM 04/26/22   Assessment and Plan   Reassessment (10:13 PM) -Results as above. -Dr. Despina Hidden consulted prior to results available and states if no torsion found, recommend Megace 40mg  for suppression until follow up can be scheduled. -Provider to bedside to inform patient of findings. -Patient reports she feels "the best I've felt all week" and rates her pain a 3-4/10. -Discussed findings and recommendation. Patient agreeable. -Precautions reviewed. -Rx sent to pharmacy on file.  -Encouraged to call primary office or return to MAU if symptoms worsen or with the onset of new symptoms. -Discharged to home in  stable condition.  Maryann Conners MSN, CNM Advanced Practice Provider, [facility]

## 2022-04-26 NOTE — MAU Note (Signed)
Called lab to request blood to be ran. Kim in [employer] stated they have been busy. RN informed lab that the orders were placed STAT and the blood was sent over an hour ago.

## 2022-04-30 LAB — GC/CHLAMYDIA PROBE AMP (~~LOC~~) NOT AT ARMC
Chlamydia: NEGATIVE
Comment: NEGATIVE
Comment: NORMAL
Neisseria Gonorrhea: NEGATIVE

## 2022-06-06 ENCOUNTER — Ambulatory Visit: Payer: Medicaid - Out of State | Admitting: Obstetrics and Gynecology

## 2022-07-11 ENCOUNTER — Encounter (HOSPITAL_COMMUNITY): Payer: Self-pay | Admitting: *Deleted

## 2022-07-11 ENCOUNTER — Emergency Department (HOSPITAL_COMMUNITY): Payer: Medicaid - Out of State

## 2022-07-11 ENCOUNTER — Inpatient Hospital Stay (HOSPITAL_COMMUNITY)
Admission: EM | Admit: 2022-07-11 | Discharge: 2022-07-22 | DRG: 394 | Disposition: A | Discharge status: Home / Self Care | Payer: Medicaid - Out of State | Attending: Student in an Organized Health Care Education/Training Program | Admitting: Student in an Organized Health Care Education/Training Program

## 2022-07-11 ENCOUNTER — Other Ambulatory Visit: Payer: Self-pay

## 2022-07-11 DIAGNOSIS — Z79899 Other long term (current) drug therapy: Secondary | ICD-10-CM

## 2022-07-11 DIAGNOSIS — E440 Moderate protein-calorie malnutrition: Secondary | ICD-10-CM | POA: Diagnosis present

## 2022-07-11 DIAGNOSIS — F1290 Cannabis use, unspecified, uncomplicated: Secondary | ICD-10-CM | POA: Diagnosis present

## 2022-07-11 DIAGNOSIS — K449 Diaphragmatic hernia without obstruction or gangrene: Secondary | ICD-10-CM | POA: Diagnosis present

## 2022-07-11 DIAGNOSIS — N83209 Unspecified ovarian cyst, unspecified side: Secondary | ICD-10-CM

## 2022-07-11 DIAGNOSIS — R000 Tachycardia, unspecified: Secondary | ICD-10-CM | POA: Diagnosis present

## 2022-07-11 DIAGNOSIS — R112 Nausea with vomiting, unspecified: Principal | ICD-10-CM | POA: Diagnosis present

## 2022-07-11 DIAGNOSIS — N83202 Unspecified ovarian cyst, left side: Secondary | ICD-10-CM | POA: Diagnosis present

## 2022-07-11 DIAGNOSIS — R1115 Cyclical vomiting syndrome unrelated to migraine: Principal | ICD-10-CM | POA: Diagnosis present

## 2022-07-11 DIAGNOSIS — Z6824 Body mass index (BMI) 24.0-24.9, adult: Secondary | ICD-10-CM

## 2022-07-11 DIAGNOSIS — R9431 Abnormal electrocardiogram [ECG] [EKG]: Secondary | ICD-10-CM | POA: Diagnosis present

## 2022-07-11 DIAGNOSIS — E876 Hypokalemia: Secondary | ICD-10-CM | POA: Diagnosis present

## 2022-07-11 LAB — COMPREHENSIVE METABOLIC PANEL
ALT: 20 U/L (ref 0–44)
AST: 22 U/L (ref 15–41)
Albumin: 4.2 g/dL (ref 3.5–5.0)
Alkaline Phosphatase: 70 U/L (ref 38–126)
Anion gap: 9 (ref 5–15)
BUN: 9 mg/dL (ref 6–20)
CO2: 24 mmol/L (ref 22–32)
Calcium: 9.3 mg/dL (ref 8.9–10.3)
Chloride: 103 mmol/L (ref 98–111)
Creatinine, Ser: 0.94 mg/dL (ref 0.44–1.00)
GFR, Estimated: >60 mL/min (ref >60)
Glucose, Bld: 145 mg/dL — ABNORMAL HIGH (ref 70–99)
Potassium: 4.1 mmol/L (ref 3.5–5.1)
Sodium: 136 mmol/L (ref 135–145)
Total Bilirubin: 0.5 mg/dL (ref 0.3–1.2)
Total Protein: 7.7 g/dL (ref 6.5–8.1)

## 2022-07-11 LAB — RAPID URINE DRUG SCREEN, HOSP PERFORMED
Amphetamines: NOT DETECTED
Barbiturates: NOT DETECTED
Benzodiazepines: NOT DETECTED
Cocaine: NOT DETECTED
Opiates: NOT DETECTED
Tetrahydrocannabinol: POSITIVE — AB

## 2022-07-11 LAB — CBC
HCT: 43.2 % (ref 36.0–46.0)
Hemoglobin: 13.8 g/dL (ref 12.0–15.0)
MCH: 29 pg (ref 26.0–34.0)
MCHC: 31.9 g/dL (ref 30.0–36.0)
MCV: 90.8 fL (ref 80.0–100.0)
Platelets: 283 10*3/uL (ref 150–400)
RBC: 4.76 MIL/uL (ref 3.87–5.11)
RDW: 14.7 % (ref 11.5–15.5)
WBC: 7.1 10*3/uL (ref 4.0–10.5)
nRBC: 0 % (ref 0.0–0.2)

## 2022-07-11 LAB — URINALYSIS, ROUTINE W REFLEX MICROSCOPIC
Bacteria, UA: NONE SEEN
Bilirubin Urine: NEGATIVE
Glucose, UA: NEGATIVE mg/dL
Hgb urine dipstick: NEGATIVE
Ketones, ur: 5 mg/dL — AB
Leukocytes,Ua: NEGATIVE
Nitrite: NEGATIVE
Protein, ur: 30 mg/dL — AB
Specific Gravity, Urine: 1.02 (ref 1.005–1.030)
pH: 8 (ref 5.0–8.0)

## 2022-07-11 LAB — MAGNESIUM: Magnesium: 1.8 mg/dL (ref 1.7–2.4)

## 2022-07-11 LAB — PREGNANCY, URINE: Preg Test, Ur: NEGATIVE

## 2022-07-11 LAB — LIPASE, BLOOD: Lipase: 31 U/L (ref 11–51)

## 2022-07-11 MED ORDER — ACETAMINOPHEN 10 MG/ML IV SOLN: 1000.0000 mg | Freq: Four times a day (QID) | INTRAVENOUS | Status: DC

## 2022-07-11 MED ORDER — IOHEXOL 350 MG/ML SOLN
75.0000 mL | Freq: Once | INTRAVENOUS | Status: AC | PRN
  Administered 2022-07-11: 75 mL via INTRAVENOUS

## 2022-07-11 MED ORDER — ENOXAPARIN SODIUM 40 MG/0.4ML IJ SOSY
40.0000 mg | PREFILLED_SYRINGE | INTRAMUSCULAR | Status: DC
  Filled 2022-07-11 (×2): qty 0.4

## 2022-07-11 MED ORDER — KETOROLAC TROMETHAMINE 15 MG/ML IJ SOLN
15.0000 mg | Freq: Once | INTRAMUSCULAR | Status: AC
  Administered 2022-07-11: 15 mg via INTRAVENOUS
  Filled 2022-07-11: qty 1

## 2022-07-11 MED ORDER — ONDANSETRON HCL 4 MG/2ML IJ SOLN
4.0000 mg | Freq: Once | INTRAMUSCULAR | Status: AC
  Administered 2022-07-11: 4 mg via INTRAVENOUS
  Filled 2022-07-11: qty 2

## 2022-07-11 MED ORDER — ONDANSETRON HCL 4 MG/2ML IJ SOLN
4.0000 mg | Freq: Four times a day (QID) | INTRAMUSCULAR | Status: DC | PRN
  Administered 2022-07-11 – 2022-07-12 (×2): 4 mg via INTRAVENOUS
  Filled 2022-07-11 (×2): qty 2

## 2022-07-11 MED ORDER — ACETAMINOPHEN 325 MG PO TABS
650.0000 mg | ORAL_TABLET | Freq: Four times a day (QID) | ORAL | Status: DC | PRN
  Filled 2022-07-11: qty 2

## 2022-07-11 MED ORDER — DIPHENHYDRAMINE HCL 50 MG/ML IJ SOLN
12.5000 mg | Freq: Once | INTRAMUSCULAR | Status: AC
  Administered 2022-07-11: 12.5 mg via INTRAVENOUS
  Filled 2022-07-11: qty 1

## 2022-07-11 MED ORDER — MAGNESIUM SULFATE 2 GM/50ML IV SOLN
2.0000 g | Freq: Once | INTRAVENOUS | Status: AC
  Administered 2022-07-11: 2 g via INTRAVENOUS
  Filled 2022-07-11: qty 50

## 2022-07-11 MED ORDER — ACETAMINOPHEN 650 MG RE SUPP: 650.0000 mg | Freq: Four times a day (QID) | RECTAL | Status: DC | PRN

## 2022-07-11 MED ORDER — SODIUM CHLORIDE 0.9 % IV SOLN
200.0000 mg | Freq: Four times a day (QID) | INTRAVENOUS | Status: DC | PRN
  Filled 2022-07-11: qty 2

## 2022-07-11 MED ORDER — CAMPHOR-MENTHOL 0.5-0.5 % EX LOTN
TOPICAL_LOTION | CUTANEOUS | Status: DC | PRN
  Filled 2022-07-11: qty 222

## 2022-07-11 MED ORDER — ACETAMINOPHEN 10 MG/ML IV SOLN
1000.0000 mg | Freq: Four times a day (QID) | INTRAVENOUS | Status: DC | PRN
  Administered 2022-07-12: 1000 mg via INTRAVENOUS
  Filled 2022-07-11: qty 100

## 2022-07-11 MED ORDER — LACTATED RINGERS IV SOLN: INTRAVENOUS | Status: AC

## 2022-07-11 MED ORDER — METOCLOPRAMIDE HCL 5 MG/ML IJ SOLN
10.0000 mg | INTRAMUSCULAR | Status: AC
  Administered 2022-07-11: 10 mg via INTRAVENOUS
  Filled 2022-07-11: qty 2

## 2022-07-11 MED ORDER — MELOXICAM 7.5 MG PO TABS: 7.5000 mg | ORAL_TABLET | Freq: Two times a day (BID) | ORAL | Status: DC | PRN

## 2022-07-11 MED ORDER — HYDROMORPHONE HCL 1 MG/ML IJ SOLN
0.2500 mg | INTRAMUSCULAR | Status: AC | PRN
  Administered 2022-07-11 – 2022-07-12 (×2): 0.25 mg via INTRAVENOUS
  Filled 2022-07-11 (×2): qty 0.5

## 2022-07-11 MED ORDER — ONDANSETRON 4 MG PO TBDP: 4.0000 mg | ORAL_TABLET | Freq: Three times a day (TID) | ORAL | 0 refills | Status: DC | PRN

## 2022-07-11 MED ORDER — SODIUM CHLORIDE 0.9 % IV BOLUS
1000.0000 mL | Freq: Once | INTRAVENOUS | Status: AC
  Administered 2022-07-11: 1000 mL via INTRAVENOUS

## 2022-07-11 MED ORDER — KETOROLAC TROMETHAMINE 15 MG/ML IJ SOLN
15.0000 mg | Freq: Four times a day (QID) | INTRAMUSCULAR | Status: AC
  Administered 2022-07-12 (×2): 15 mg via INTRAVENOUS
  Filled 2022-07-11 (×2): qty 1

## 2022-07-11 MED ORDER — SCOPOLAMINE 1 MG/3DAYS TD PT72
1.0000 | MEDICATED_PATCH | TRANSDERMAL | Status: DC
  Administered 2022-07-11 – 2022-07-20 (×4): 1.5 mg via TRANSDERMAL
  Filled 2022-07-11 (×5): qty 1

## 2022-07-11 MED ORDER — TRIMETHOBENZAMIDE HCL 100 MG/ML IM SOLN
200.0000 mg | Freq: Three times a day (TID) | INTRAMUSCULAR | Status: DC | PRN
  Filled 2022-07-11: qty 2

## 2022-07-11 MED ORDER — DIPHENHYDRAMINE HCL 50 MG/ML IJ SOLN
12.5000 mg | Freq: Once | INTRAMUSCULAR | Status: AC | PRN
  Administered 2022-07-11: 12.5 mg via INTRAVENOUS
  Filled 2022-07-11: qty 1

## 2022-07-11 MED ORDER — PROMETHAZINE HCL 25 MG RE SUPP: 25.0000 mg | Freq: Four times a day (QID) | RECTAL | 0 refills | Status: DC | PRN

## 2022-07-11 MED ORDER — SODIUM CHLORIDE 0.9 % IV SOLN
25.0000 mg | Freq: Once | INTRAVENOUS | Status: AC
  Administered 2022-07-11: 25 mg via INTRAVENOUS
  Filled 2022-07-11: qty 1

## 2022-07-11 MED ORDER — HYDROMORPHONE HCL 1 MG/ML IJ SOLN
0.5000 mg | Freq: Once | INTRAMUSCULAR | Status: DC | PRN
  Filled 2022-07-11: qty 0.5

## 2022-07-11 MED ORDER — HALOPERIDOL LACTATE 5 MG/ML IJ SOLN
2.0000 mg | Freq: Once | INTRAMUSCULAR | Status: AC
  Administered 2022-07-11: 2 mg via INTRAVENOUS
  Filled 2022-07-11: qty 1

## 2022-07-11 NOTE — ED Notes (Signed)
Pt's IV has infiltrated. Removed by this RN. Attempted to start another IV but was unsuccessful. Pt states they usually have to use the [place] or IV team to get an IV.

## 2022-07-11 NOTE — ED Provider Notes (Signed)
Care assumed from Sharilyn Sites, PA-C at shift change. Please see their note for further information.   Briefly: Patient presents with 48 hours of nausea and vomiting with some diarrhea and mild abd pain. Hx several visits for same over the past few years. Last visits for same were attributed to hyperemesis gravidarum as patient was pregnant. 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 Patient does note that she continues to smoke marijuana regularly.  Plan: Patient given Zofran Reglan and Haldol, sleeping afterwards. Plan to let her wake up and then PO challenge. Labs benign.  0815: Patient called out stating that she was nauseous and vomiting again, unable to tolerate po and having worsening abdominal pain. Plan to give IV toradol, phenergan, and CT  11:15: Patient requesting more nausea medication. Given Zofran and Benadryl per her request. CT still pending.   CT has resulted and reveals  There is no evidence of intestinal obstruction or pneumoperitoneum. Appendix is not dilated. There is no hydronephrosis.   There is 5.2 cm cyst in the left adnexa, possibly functional ovarian cysts. There is moderate amount of free fluid in pelvis suggesting recent rupture of ovarian cyst or follicle. Follow-up pelvic sonogram as clinically warranted may be considered.   There is mild diffuse wall thickening in ascending and transverse colon which may be due to incomplete distention or suggest nonspecific colitis. There is no pericolic stranding or fluid collection.   Small hiatal hernia. Possible Bartholin cyst is seen in the right labium.  I have personally reviewed and interpreted this imaging and agree with radiology interpretation.   12:30: discussed findings with patient, states she is still nauseous. Will require admission for same. Potentially due to cannabis use. She is  understanding and in agreement.  Findings and plan of care discussed with supervising physician Dr. Rubin Payor who is in agreement.      Vear Clock 07/11/22 1241    Benjiman Core, MD 07/11/22 [phone]

## 2022-07-11 NOTE — ED Notes (Signed)
ED TO INPATIENT HANDOFF REPORT  ED Nurse Name and Phone #: Vernona Rieger [phone]  S Name/Age/Gender Krista Stout 32 y.o. female Room/Bed: 028C/028C  Code Status   Code Status: Full Code  Home/SNF/Other Home Patient oriented to: self, place, time, and situation Is this baseline? Yes   Triage Complete: Triage complete  Chief Complaint Intractable nausea and vomiting [R11.2]  Triage Note The pt is c/o abd pain for 3 days with nausea and vomiting   lmp June 20th   Allergies No Known Allergies  Level of Care/Admitting Diagnosis ED Disposition     ED Disposition  Admit   Condition  --   Comment  Hospital Area: [facility] [[id number]]  Level of Care: Telemetry Medical [104]  May place patient in observation at [facility] or [facility] if equivalent level of care is available:: No  Covid Evaluation: Asymptomatic - no recent exposure (last 10 days) testing not required  Diagnosis: Intractable nausea and vomiting [720114]  Admitting Physician: Mercie Eon [[id number]]  Attending Physician: Mercie Eon [[id number]]          B Medical/Surgery History Past Medical History:  Diagnosis Date   Asthma    Cyclical vomiting syndrome    History of feeding tube 2021   [facility] hospital in [place]   Past Surgical History:  Procedure Laterality Date   INDUCED ABORTION  03/12/2022   LAPAROSCOPIC OOPHERECTOMY Right 2022   In [place]. Patient states fibroid had right ovary encased and both were removed; unsure if tube was removed, too   UMBILICAL HERNIA REPAIR     w/ mesh     A IV Location/Drains/Wounds Patient Lines/Drains/Airways Status     Active Line/Drains/Airways     Name Placement date Placement time Site Days   Peripheral IV 07/11/22 20 G 1.88" Anterior;Left Forearm 07/11/22  1102  Forearm  less than 1            Intake/Output Last 24 hours  Intake/Output Summary (Last 24 hours) at 07/11/2022 1329 Last data filed at 07/11/2022 1136 Gross per  24 hour  Intake 46.19 ml  Output --  Net 46.19 ml    Labs/Imaging Results for orders placed or performed during the hospital encounter of 07/11/22 (from the past 48 hour(s))  Urinalysis, Routine w reflex microscopic -Urine, Clean Catch     Status: Abnormal   Collection Time: 07/11/22  3:02 AM  Result Value Ref Range   Color, Urine YELLOW YELLOW   APPearance CLOUDY (A) CLEAR   Specific Gravity, Urine 1.020 1.005 - 1.030   pH 8.0 5.0 - 8.0   Glucose, UA NEGATIVE NEGATIVE mg/dL   Hgb urine dipstick NEGATIVE NEGATIVE   Bilirubin Urine NEGATIVE NEGATIVE   Ketones, ur 5 (A) NEGATIVE mg/dL   Protein, ur 30 (A) NEGATIVE mg/dL   Nitrite NEGATIVE NEGATIVE   Leukocytes,Ua NEGATIVE NEGATIVE   RBC / HPF 0-5 0 - 5 RBC/hpf   WBC, UA 0-5 0 - 5 WBC/hpf   Bacteria, UA NONE SEEN NONE SEEN   Squamous Epithelial / HPF 11-20 0 - 5 /HPF   Mucus PRESENT    Amorphous Crystal PRESENT     Comment: Performed at [facility], [address] [place], [place] [address]  Pregnancy, urine     Status: None   Collection Time: 07/11/22  3:02 AM  Result Value Ref Range   Preg Test, Ur NEGATIVE NEGATIVE    Comment:        THE SENSITIVITY OF THIS  METHODOLOGY IS >25 mIU/mL. Performed at [facility], [address] [place], [place] [address]   Lipase, blood     Status: None   Collection Time: 07/11/22  3:05 AM  Result Value Ref Range   Lipase 31 11 - 51 U/L    Comment: Performed at [facility], [address], [place], [place] [address]  Comprehensive metabolic panel     Status: Abnormal   Collection Time: 07/11/22  3:05 AM  Result Value Ref Range   Sodium 136 135 - 145 mmol/L   Potassium 4.1 3.5 - 5.1 mmol/L   Chloride 103 98 - 111 mmol/L   CO2 24 22 - 32 mmol/L   Glucose, Bld 145 (H) 70 - 99 mg/dL    Comment: Glucose reference range applies only to samples taken after fasting for at least 8 hours.   BUN 9 6 - 20 mg/dL   Creatinine, Ser 0.98 0.44 - 1.00 mg/dL   Calcium 9.3 8.9 -  11.9 mg/dL   Total Protein 7.7 6.5 - 8.1 g/dL   Albumin 4.2 3.5 - 5.0 g/dL   AST 22 15 - 41 U/L   ALT 20 0 - 44 U/L   Alkaline Phosphatase 70 38 - 126 U/L   Total Bilirubin 0.5 0.3 - 1.2 mg/dL   GFR, Estimated >14 >78 mL/min    Comment: (NOTE) Calculated using the CKD-EPI Creatinine Equation (2021)    Anion gap 9 5 - 15    Comment: Performed at [facility], [address], [place], [place] [address]  CBC     Status: None   Collection Time: 07/11/22  3:05 AM  Result Value Ref Range   WBC 7.1 4.0 - 10.5 K/uL   RBC 4.76 3.87 - 5.11 MIL/uL   Hemoglobin 13.8 12.0 - 15.0 g/dL   HCT 13.0 86.5 - 78.4 %   MCV 90.8 80.0 - 100.0 fL   MCH 29.0 26.0 - 34.0 pg   MCHC 31.9 30.0 - 36.0 g/dL   RDW 69.6 29.5 - 28.4 %   Platelets 283 150 - 400 K/uL   nRBC 0.0 0.0 - 0.2 %    Comment: Performed at [facility], [address], [place], [place] [address]   CT ABDOMEN PELVIS W CONTRAST  Result Date: 07/11/2022 CLINICAL DATA:  Abdominal pain, nausea, vomiting EXAM: CT ABDOMEN AND PELVIS WITH CONTRAST TECHNIQUE: Multidetector CT imaging of the abdomen and pelvis was performed using the standard protocol following bolus administration of intravenous contrast. RADIATION DOSE REDUCTION: This exam was performed according to the departmental dose-optimization program which includes automated exposure control, adjustment of the mA and/or kV according to patient size and/or use of iterative reconstruction technique. CONTRAST:  75mL OMNIPAQUE IOHEXOL 350 MG/ML SOLN COMPARISON:  04/16/2022 FINDINGS: Lower chest: Visualized lower lung fields are clear. Hepatobiliary: No focal abnormalities are seen in liver. Gallbladder is unremarkable. There is no dilation of bile ducts. Pancreas: No focal abnormalities are seen. Spleen: Unremarkable. Adrenals/Urinary Tract: Adrenals are unremarkable. There is no hydronephrosis. There are no renal or ureteral stones. Urinary bladder is not distended. Stomach/Bowel: Small  hiatal hernia is seen. Stomach is unremarkable. Small bowel loops are not dilated. Appendix is not dilated. There is incomplete distention of colon. There is mild diffuse wall thickening in the ascending and transverse colon. There is no pericolic stranding. Vascular/Lymphatic: Unremarkable. Reproductive: There is 5.2 cm smooth marginated fluid density lesion in the left adnexa. There is moderate amount of  free fluid in pelvis. There is 1.7 cm smooth magnesia fluid density lesion in the right labium, possibly Bartholin cyst. Other: There is no ascites or pneumoperitoneum. Musculoskeletal: No acute findings are seen. IMPRESSION: There is no evidence of intestinal obstruction or pneumoperitoneum. Appendix is not dilated. There is no hydronephrosis. There is 5.2 cm cyst in the left adnexa, possibly functional ovarian cysts. There is moderate amount of free fluid in pelvis suggesting recent rupture of ovarian cyst or follicle. Follow-up pelvic sonogram as clinically warranted may be considered. There is mild diffuse wall thickening in ascending and transverse colon which may be due to incomplete distention or suggest nonspecific colitis. There is no pericolic stranding or fluid collection. Small hiatal hernia. Possible Bartholin cyst is seen in the right labium. Electronically Signed   By: Ernie Avena M.D.   On: 07/11/2022 12:13    Pending Labs Unresulted Labs (From admission, onward)     Start     Ordered   07/12/22 0500  Basic metabolic panel  Tomorrow morning,   R        07/11/22 1320   07/12/22 0500  CBC  Tomorrow morning,   R        07/11/22 1320   07/11/22 1227  Rapid urine drug screen (hospital performed)  ONCE - STAT,   STAT       Question:  Release to patient  Answer:  Immediate   07/11/22 1227            Vitals/Pain Today's Vitals   07/11/22 0930 07/11/22 0937 07/11/22 1052 07/11/22 1327  BP:  (!) 135/91 (!) 134/90 131/88  Pulse: 77  75 79  Resp: (!) 26  (!) 21 16  Temp:  98.4  F (36.9 C) 98.4 F (36.9 C) 98.8 F (37.1 C)  TempSrc:  Oral Oral Oral  SpO2: 100%  100% 98%  Weight:      Height:      PainSc:        Isolation Precautions No active isolations  Medications Medications  enoxaparin (LOVENOX) injection 40 mg (has no administration in time range)  lactated ringers infusion (has no administration in time range)  acetaminophen (TYLENOL) tablet 650 mg (has no administration in time range)    Or  acetaminophen (TYLENOL) suppository 650 mg (has no administration in time range)  trimethobenzamide (TIGAN) injection 200 mg (has no administration in time range)  sodium chloride 0.9 % bolus 1,000 mL (0 mLs Intravenous Stopped 07/11/22 0606)  ondansetron (ZOFRAN) injection 4 mg (4 mg Intravenous Given 07/11/22 0354)  diphenhydrAMINE (BENADRYL) injection 12.5 mg (12.5 mg Intravenous Given 07/11/22 0354)  metoCLOPramide (REGLAN) injection 10 mg (10 mg Intravenous Given 07/11/22 0503)  haloperidol lactate (HALDOL) injection 2 mg (2 mg Intravenous Given 07/11/22 0510)  ketorolac (TORADOL) 15 MG/ML injection 15 mg (15 mg Intravenous Given 07/11/22 1104)  promethazine (PHENERGAN) 25 mg in sodium chloride 0.9 % 50 mL IVPB (0 mg Intravenous Stopped 07/11/22 1136)  ondansetron (ZOFRAN) injection 4 mg (4 mg Intravenous Given 07/11/22 1112)  diphenhydrAMINE (BENADRYL) injection 12.5 mg (12.5 mg Intravenous Given 07/11/22 1113)  iohexol (OMNIPAQUE) 350 MG/ML injection 75 mL (75 mLs Intravenous Contrast Given 07/11/22 1132)    Mobility walks     Focused Assessments ABD pain with N/V. Emesis controlled with medication. Abd tender.   R Recommendations: See Admitting Provider Note  Report given to:   Additional Notes:

## 2022-07-11 NOTE — Progress Notes (Addendum)
Called to bedside for pain. On arrival this person is laying in bed, no apparent acute distress. She reports ongoing nausea. She has an episode of small volume emesis in my presence. She has diffuse abdominal pain rated as severe related to nausea and vomiting. I note that she has had several admissions for nausea and vomiting over the last few years. During prior hospitalizations, she reports that ondansetron, promethazine, and metoclopramide have been effective.  BP (!) 149/96 (BP Location: Left Arm)   Pulse 63   Temp 99 F (37.2 C) (Oral)   Resp 16   Ht 5\' 2"  (1.575 m)   Wt 67.7 kg   LMP 06/26/2022   SpO2 100%   BMI 27.30 kg/m   No apparent acute distress, paroxysms of vomiting Abdomen is diffusely tender otherwise soft and non-distended Skin is warm and dry Alert and oriented Upset, concordant affect  This person reports continued severe nausea, vomiting, and abdominal pain despite aggressive supportive therapy to this point. Will continue aggressive symptom management overnight. Unfortunately this person is unable to take p.o. currently and declined p.o. meds that were on order before due to severe nausea. Qtc is ~460 by my measurement, marginally prolonged. She declined telemetry monitoring earlier this evening, at the time of my assessment tele leads were off, so this was discontinued. Will restart ondansetron 4 mg IV q6 prn overnight. EKG for QT monitoring after a few doses. Magnesium was within normal limits this morning. Will treat pain with 0.25 mg hydromorphone IV since she is unable to take p.o. currently.  Update @ 5:12 AM Avenly continues to report ongoing severe nausea with scant vomiting. Last time she was hospitalized she was on a regimen of ondansetron, promethazine, and metoclopramide for symptom control.  She's comfortable albeit tired-appearing, laying still in bed when I enter the room. She sits up without difficulty, has paroxysms of retching without emesis.  Given  primary team's concerns about QT prolongation will try a dose of lorazepam for ongoing nausea and vomiting. Repeat EKG for QT assessment, if not prolonged from baseline would consider addition of promethazine suppository which she said worked well for her in the past.  Marrianne Mood MD 07/12/2022, 5:17 AM

## 2022-07-11 NOTE — Progress Notes (Signed)
On call paged about pain management for the patient. On call stated that they will come and see the patient.

## 2022-07-11 NOTE — ED Notes (Signed)
Pt states she took one sip of water and started vomiting. Provider made aware. ED tech changed her linen. This RN gave her a wet washcloth and a couple of dry ones for comfort.

## 2022-07-11 NOTE — ED Notes (Signed)
CT aware the pt has a new 20g in her L FA. Coming to get her now

## 2022-07-11 NOTE — Hospital Course (Addendum)
Krista Stout is a 32 y.o. female with a past medical history of hyperemesis gravidarum, cyclic vomiting syndrome who presents emergency department for intractable nausea and vomiting and admitted for further evaluation management.   Cyclic vomiting syndrome Krista Stout is a 32 yo female who presented with intractable nausea and vomiting and was admitted with severe, cyclic vomiting syndrome. She is not pregnant, and has had previous admissions for nausea and vomiting in the setting of hyperemesis gravidarum. Her cyclic vomiting syndrome may be associated with THC use. She received maximum supportive care and continued to have nausea/vomiting and was not able to take in PO intake/nutrition. Her severe nausea and vomiting also caused significant abdominal pain and discomfort during her hospitalization, requiring the use of IV dilaudid, which was discontinued when pain improved. The patient also had a Cortrak placed for tube feeds, however, she did not tolerate the tube and had vomiting after it was placed. After one week without any significant PO intake, the decision was made to start TPN. The patient started TPN on 7/3, while also maintaining a clear liquid diet. She did well with TPN and her energy improved and overall her nausea and vomiting resolved. She received TPN for 4 days. We slowly advanced her diet and by the time of discharge she was able to eat a normal diet. TPN was discontinued. The patient was medically stable for discharge. Discharged with PRN zofran and reglan.    Adnexal cyst Incidentally noted on imaging at admission, 5.2 cm left adnexal cyst. Recommend outpatient follow up.    Follow up:  A. Intractable nausea and vomitting:  B. Hypokalemia:   Krista Stout,  It was a pleasure taking care of you at [facility]. You were admitted for *** and treated for ***. We are discharging you home now that you are doing better. Please follow the following instructions.   1) 2) 3)  Take care,  Dr. Marland Kitchen   7/6 Had one episode of vomiting but overall feel smuch better NO abdominal pain   7/7: patient feels a lot better today and ate a full meal for dinner. She is excited to advance her diet to regular food. Will stop TPN today, patient agreeable. Nausea is almost completely gone per patient. No vomiting last night. Possible d/c late today or tomorrow.

## 2022-07-11 NOTE — H&P (Signed)
Date: 07/11/2022               Patient Name:  Krista Stout MRN: [id number]  DOB: 05-06-1990 Age / Sex: 32 y.o., female   PCP: Pcp, No         Medical Service: Internal Medicine Teaching Service         Attending Physician: Dr. Mercie Eon, MD    First Contact: Dr. Modena Slater Pager: [phone]  Second Contact: Dr. Champ Mungo Pager: [phone]       After Hours (After 5p/  First Contact Pager: [phone]  weekends / holidays): Second Contact Pager: [phone]   Chief Complaint: nausea and vomiting  History of Present Illness: Krista Stout is a 32 y.o. F with PMH cyclical vomiting syndrome, hyperemesis gravidarum who presented to [facility] ED with 2 days of intractable nausea and vomiting. The vomit has consisted of food she attempted to eat or was bile colored, and she did note an episode of brown vomitus. The onset of symptoms did not seem to have any association with a specific meal she ate and no other contacts are sick. No recent travel including cruises or air travel. She denies fever or chills. Says she has some leftover reglan, zofran, phenergan that she has tried at home which have helped. She has diffuse abdominal pain and her abdomen feels distended and tight. She reports no changes to her bowel movements.   Of note, she has been seen in the past for intractable nausea and vomiting with her most recent emergency visit in May 2024. On Chart reviews she has had hyperemesis gravidarum back in July 2017, 01/2016, and 02/2022 three times. She was also evaluated for nausea and vomiting in 03/2022 which was related to a ruptured hemorrhagic cyst. Patient was also followed for acute on chronic n/v in 06/2018 three times.   On my exam she stated that she wanted to sleep and that she would not be able to answer any further questions.   ED Course: Labs pertinent for UA with 5 ketones, 30 protein; negative UPT; UDS positive for THC; normal lipase; no electrolyte derangements on CMP, no  abnormalities on CBC. She received 2 doses of zofran, phenergan, reglan, haldol with ongoing vomiting. Admitted for intractable n/v.  Meds:  Denies any home medications.  Allergies: Allergies as of 07/11/2022   (No Known Allergies)   PMH: Past Medical History:  Diagnosis Date   Asthma    Cyclical vomiting syndrome    History of feeding tube 2021   [facility] in [place]   PSH: Past Surgical History:  Procedure Laterality Date   INDUCED ABORTION  03/12/2022   LAPAROSCOPIC OOPHERECTOMY Right 2022   In [place]. Patient states fibroid had right ovary encased and both were removed; unsure if tube was removed, too   UMBILICAL HERNIA REPAIR     w/ mesh   Family History: None reported.  Social History: Patient lives with her parents, her daughter, and her son. She works in [employer], political party. Does not have a PCP. Denies alcohol or tobacco use. Has used marijuana in the past, social user, says last use was in March.   Review of Systems: A complete ROS was negative except as per HPI.   Physical Exam: Blood pressure 131/88, pulse 79, temperature 98.8 F (37.1 C), temperature source Oral, resp. rate 16, height 5\' 2"  (1.575 m), weight 67.7 kg, last menstrual period 06/26/2022, SpO2 98 %, unknown if currently breastfeeding.  General: Patient is resting  comfortably in bed in no acute distress  HENT: Normocephalic, atraumatic, moist mucus membranes  Cardio: Regular rate and rhythm, no murmurs, rubs or gallops Pulmonary: Clear to ausculation bilaterally with no rales, rhonchi, and crackles  Abdomen: Soft, diffuses tenderness, with normoactive bowel sounds.  Skin: Normal Skin Turgor  Extremity: No lower extremity edema appreciated   EKG: personally reviewed my interpretation is NSR with Qtc 473 ms.  CT abdomen and pelvis: There is no evidence of intestinal obstruction or pneumoperitoneum. Appendix is not dilated. There is no hydronephrosis.   There is 5.2 cm cyst in the left adnexa,  possibly functional ovarian cysts. There is moderate amount of free fluid in pelvis suggesting recent rupture of ovarian cyst or follicle. Follow-up pelvic sonogram as clinically warranted may be considered.   There is mild diffuse wall thickening in ascending and transverse colon which may be due to incomplete distention or suggest nonspecific colitis. There is no pericolic stranding or fluid collection.   Small hiatal hernia. Possible Bartholin cyst is seen in the right labium.  Assessment & Plan by Problem: Active Problems:   Intractable nausea and vomiting  #Intractable nausea and vomiting Patient presents with 2 days of intractable n/v. She was tolerating PO intake prior to onset of symptoms. Differential includes gastric ulcer, infection such has cholecystitis or appendicitis or gastroenteritis, exacerbation of possible previous diagnosis of cyclic vomiting syndrome, cannabis hyperemesis syndrome, pregnancy, bowel obstruction, medication-induced. Afebrile without leukocytosis and HDS, decreasing suspicion for infectious etiology. CT abdomen pelvis did not show obstruction or pneumoperitoneum, normal appendix. There is moderate free fluid in the pelvis with concern for adnexal cysts, and rupture of a cyst could have contributed to symptoms. UPT negative so not concerned for pregnancy. She has a history of hernia repair but is having BM so do not suspect that as contributing cause. Denies OP medications so not suspected to be medication-induced. Lipase normal, abdominal pain is diffuse, and no imaging findings that would point to acute pancreatitis. Do not think this is related to a bowel obstruction. Patient was quite drowsy after the anti-emetics she had received thus far and unable/willing to engage in prolonged interview about symptoms, so could plan to further assess for peripheral vertiginous symptoms once more awake. Of note she did mention having some dark brown emesis, but with one off  episode less likely a bleed. UDS is positive for THC despite patient report of last use of marijuana several months ago. In my research, THC use can exacerbate symptoms of cyclical vomiting syndrome. 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 -Repeat EKG; caution on additional Qtc prolonging agents -Tigan 200 mg IM q8h PRN n/v -LR 100 cc/h x 10h -Advance diet as tolerated -Consider pelvic US -Will need counseling on cessation of THC use  #Incidental Adenxa cyst Could be contributing to the overall picture with imaging findings concerning for recent rupture of a cyst. Will monitor for worsening signs.   -Continue to monitor   Dispo: Admit patient to Observation with expected length of stay less than 2 midnights.  Signed: Modena Slater, DO  PGY-1 07/11/2022, 2:22 PM  After 5pm on weekdays and 1pm on weekends: On Call pager: [phone]

## 2022-07-11 NOTE — ED Provider Notes (Signed)
[place] EMERGENCY DEPARTMENT AT [facility] Provider Note   CSN: [id number] Arrival date & time: 07/11/22  0249     History  Chief Complaint  Patient presents with   Abdominal Pain    Krista Stout is a 32 y.o. female.  The history is provided by the patient and medical records.  Abdominal Pain Associated symptoms: nausea and vomiting    32 year old female presenting to the ED with nausea and vomiting over the past 48 hours.  States she has not been able to hold down food or fluids.  She reports generalized abdominal pain.  Some diarrhea but not a lot  No blood in emesis or stool.  No medications tried prior to arrival.  Denies any sick contacts, travel, or abnormal food intake.  Home Medications Prior to Admission medications   Medication Sig Start Date End Date Taking? Authorizing Provider  diclofenac (VOLTAREN) 75 MG EC tablet Take 1 tablet (75 mg total) by mouth 2 (two) times daily as needed. 04/16/22   Federico Flake, MD  gabapentin (NEURONTIN) 300 MG capsule Take 1 capsule (300 mg total) by mouth 3 (three) times daily as needed. 04/16/22   Federico Flake, MD  megestrol (MEGACE) 40 MG tablet Take 1 tablet (40 mg total) by mouth daily. Can increase to two tablets twice a day in the event of heavy bleeding 04/26/22   Gerrit Heck, CNM  metoCLOPramide (REGLAN) 10 MG tablet Take 1 tablet (10 mg total) by mouth every 6 (six) hours. Patient not taking: Reported on 04/16/2022 03/06/22   Milas Hock, MD  ondansetron (ZOFRAN-ODT) 8 MG disintegrating tablet Take 1 tablet (8 mg total) by mouth every 8 (eight) hours as needed for nausea or vomiting. 04/16/22   Federico Flake, MD  oxyCODONE (ROXICODONE) 5 MG immediate release tablet Take 1 tablet (5 mg total) by mouth every 4 (four) hours as needed for severe pain. 04/16/22   Federico Flake, MD  promethazine (PHENERGAN) 25 MG suppository Place 1 suppository (25 mg total) rectally every 6 (six) hours as  needed for nausea or vomiting. 04/16/22   Federico Flake, MD      Allergies    Patient has no known allergies.    Review of Systems   Review of Systems  Gastrointestinal:  Positive for abdominal pain, nausea and vomiting.  All other systems reviewed and are negative.   Physical Exam Updated Vital Signs BP 119/76   Pulse 80   Temp 98.5 F (36.9 C)   Resp 17   Ht 5\' 2"  (1.575 m)   Wt 67.7 kg   LMP 06/26/2022   SpO2 100%   BMI 27.30 kg/m   Physical Exam Vitals and nursing note reviewed.  Constitutional:      Appearance: She is well-developed.  HENT:     Head: Normocephalic and atraumatic.  Eyes:     Conjunctiva/sclera: Conjunctivae normal.     Pupils: Pupils are equal, round, and reactive to light.  Cardiovascular:     Rate and Rhythm: Normal rate and regular rhythm.     Heart sounds: Normal heart sounds.  Pulmonary:     Effort: Pulmonary effort is normal.     Breath sounds: Normal breath sounds.  Abdominal:     General: Bowel sounds are normal.     Palpations: Abdomen is soft.     Tenderness: There is no abdominal tenderness. There is no guarding or rebound.  Musculoskeletal:        General:  Normal range of motion.     Cervical back: Normal range of motion.  Skin:    General: Skin is warm and dry.  Neurological:     Mental Status: She is alert and oriented to person, place, and time.     ED Results / Procedures / Treatments   Labs (all labs ordered are listed, but only abnormal results are displayed) Labs Reviewed  COMPREHENSIVE METABOLIC PANEL - Abnormal; Notable for the following components:      Result Value   Glucose, Bld 145 (*)    All other components within normal limits  URINALYSIS, ROUTINE W REFLEX MICROSCOPIC - Abnormal; Notable for the following components:   APPearance CLOUDY (*)    Ketones, ur 5 (*)    Protein, ur 30 (*)    All other components within normal limits  LIPASE, BLOOD  CBC  PREGNANCY, URINE     EKG None  Radiology No results found.  Procedures Procedures    Medications Ordered in ED Medications  sodium chloride 0.9 % bolus 1,000 mL (0 mLs Intravenous Stopped 07/11/22 0606)  ondansetron (ZOFRAN) injection 4 mg (4 mg Intravenous Given 07/11/22 0354)  diphenhydrAMINE (BENADRYL) injection 12.5 mg (12.5 mg Intravenous Given 07/11/22 0354)  metoCLOPramide (REGLAN) injection 10 mg (10 mg Intravenous Given 07/11/22 0503)  haloperidol lactate (HALDOL) injection 2 mg (2 mg Intravenous Given 07/11/22 0510)    ED Course/ Medical Decision Making/ A&P                             Medical Decision Making Amount and/or Complexity of Data Reviewed Labs: ordered. ECG/medicine tests: ordered and independent interpretation performed.  Risk Prescription drug management.   32 y.o. F here with N/V/D x2 days.  States not able to hold anything down.  Denies travel, sick contacts.   VSS on RA.  Abdomen soft, non-tender.  Will check labs.  Given IVF, zofran and benadryl (reports itching with anti-emetics).   5:00 AM Re-checked-- initially sleeping but once awoken states still nauseated and vomiting.  Will try reglan.  Labs reviewed-- no leukocytosis or electrolyte derangement.  Pregnancy test is negative.  5:21 AM Patient found to be lying in the floor.  When questioned about this, significant other reports she "rolled out of the bed" as they had been lying in bed together.  Patient without apparently injuries but rather unwilling to get out of the floor.  She was assisted back to bed-- advised only patient in the bed, bed rails up and locked.  6:06 AM Patient sleeping soundly now after reglan and haldol.  Will need to awake and PO challenge.  Suspect she can be discharged if tolerating PO.  Final Clinical Impression(s) / ED Diagnoses Final diagnoses:  Nausea vomiting and diarrhea    Rx / DC Orders ED Discharge Orders     None         Garlon Hatchet, PA-C 07/11/22 9811     Sabas Sous, MD 07/11/22 [phone]

## 2022-07-11 NOTE — ED Notes (Signed)
Pt still vomiting after Zofran admin PA advised. Meds ordered.

## 2022-07-11 NOTE — ED Triage Notes (Signed)
The pt is c/o abd pain for 3 days with nausea and vomiting   lmp June 20th

## 2022-07-11 NOTE — Progress Notes (Signed)
Patient having nausea and pain, she stated that she did not want the IM shot for nausea that she has had it before with no relief. RN notified MD team and also that she was having pain MD placed order for scope patch and tylenol. Patient stated that she is too nauseous for tylenol and it does not help and the scope patch does not work either. RN asked her what has worked for her nausea in the past and she stated in the past Zofran and benadryl did well for her as well was phenergan suppository as well as morphine and dilaudid for pain. RN messages MD with this information waiting for message back. EKG done and placed in chart.

## 2022-07-12 DIAGNOSIS — R9431 Abnormal electrocardiogram [ECG] [EKG]: Secondary | ICD-10-CM | POA: Diagnosis present

## 2022-07-12 DIAGNOSIS — N83202 Unspecified ovarian cyst, left side: Secondary | ICD-10-CM | POA: Diagnosis not present

## 2022-07-12 DIAGNOSIS — Z79899 Other long term (current) drug therapy: Secondary | ICD-10-CM | POA: Diagnosis not present

## 2022-07-12 DIAGNOSIS — D72829 Elevated white blood cell count, unspecified: Secondary | ICD-10-CM

## 2022-07-12 DIAGNOSIS — R000 Tachycardia, unspecified: Secondary | ICD-10-CM | POA: Diagnosis present

## 2022-07-12 DIAGNOSIS — F1290 Cannabis use, unspecified, uncomplicated: Secondary | ICD-10-CM | POA: Diagnosis present

## 2022-07-12 DIAGNOSIS — E876 Hypokalemia: Secondary | ICD-10-CM | POA: Diagnosis present

## 2022-07-12 DIAGNOSIS — Z6824 Body mass index (BMI) 24.0-24.9, adult: Secondary | ICD-10-CM | POA: Diagnosis not present

## 2022-07-12 DIAGNOSIS — E440 Moderate protein-calorie malnutrition: Secondary | ICD-10-CM | POA: Diagnosis present

## 2022-07-12 DIAGNOSIS — R112 Nausea with vomiting, unspecified: Secondary | ICD-10-CM

## 2022-07-12 DIAGNOSIS — K449 Diaphragmatic hernia without obstruction or gangrene: Secondary | ICD-10-CM | POA: Diagnosis present

## 2022-07-12 DIAGNOSIS — G43A0 Cyclical vomiting, not intractable: Secondary | ICD-10-CM | POA: Diagnosis not present

## 2022-07-12 DIAGNOSIS — R1115 Cyclical vomiting syndrome unrelated to migraine: Secondary | ICD-10-CM | POA: Diagnosis not present

## 2022-07-12 LAB — BASIC METABOLIC PANEL
Anion gap: 12 (ref 5–15)
BUN: 8 mg/dL (ref 6–20)
CO2: 23 mmol/L (ref 22–32)
Calcium: 8.9 mg/dL (ref 8.9–10.3)
Chloride: 103 mmol/L (ref 98–111)
Creatinine, Ser: 0.77 mg/dL (ref 0.44–1.00)
GFR, Estimated: >60 mL/min (ref >60)
Glucose, Bld: 118 mg/dL — ABNORMAL HIGH (ref 70–99)
Potassium: 3.2 mmol/L — ABNORMAL LOW (ref 3.5–5.1)
Sodium: 138 mmol/L (ref 135–145)

## 2022-07-12 LAB — CBC
HCT: 41.3 % (ref 36.0–46.0)
Hemoglobin: 13.8 g/dL (ref 12.0–15.0)
MCH: 29.3 pg (ref 26.0–34.0)
MCHC: 33.4 g/dL (ref 30.0–36.0)
MCV: 87.7 fL (ref 80.0–100.0)
Platelets: 278 10*3/uL (ref 150–400)
RBC: 4.71 MIL/uL (ref 3.87–5.11)
RDW: 14.8 % (ref 11.5–15.5)
WBC: 11 10*3/uL — ABNORMAL HIGH (ref 4.0–10.5)
nRBC: 0 % (ref 0.0–0.2)

## 2022-07-12 LAB — MAGNESIUM: Magnesium: 2.2 mg/dL (ref 1.7–2.4)

## 2022-07-12 MED ORDER — DIPHENHYDRAMINE HCL 50 MG/ML IJ SOLN
12.5000 mg | Freq: Once | INTRAMUSCULAR | Status: AC | PRN
  Administered 2022-07-12: 12.5 mg via INTRAVENOUS
  Filled 2022-07-12: qty 1

## 2022-07-12 MED ORDER — HYDROMORPHONE HCL 1 MG/ML IJ SOLN
0.2500 mg | Freq: Four times a day (QID) | INTRAMUSCULAR | Status: DC | PRN
  Administered 2022-07-12 – 2022-07-13 (×5): 0.25 mg via INTRAVENOUS
  Filled 2022-07-12 (×5): qty 0.5

## 2022-07-12 MED ORDER — DICYCLOMINE HCL 10 MG/ML IM SOLN
10.0000 mg | Freq: Three times a day (TID) | INTRAMUSCULAR | Status: AC
  Administered 2022-07-12 – 2022-07-13 (×3): 10 mg via INTRAMUSCULAR
  Filled 2022-07-12 (×4): qty 1

## 2022-07-12 MED ORDER — METOCLOPRAMIDE HCL 5 MG/ML IJ SOLN
5.0000 mg | Freq: Three times a day (TID) | INTRAMUSCULAR | Status: DC | PRN
  Administered 2022-07-12 – 2022-07-21 (×18): 5 mg via INTRAVENOUS
  Filled 2022-07-12 (×20): qty 2

## 2022-07-12 MED ORDER — POTASSIUM CHLORIDE 10 MEQ/100ML IV SOLN
10.0000 meq | INTRAVENOUS | Status: AC
  Administered 2022-07-12 (×4): 10 meq via INTRAVENOUS
  Filled 2022-07-12 (×4): qty 100

## 2022-07-12 MED ORDER — HYDROMORPHONE HCL 1 MG/ML IJ SOLN: 0.2500 mg | Freq: Four times a day (QID) | INTRAMUSCULAR | Status: DC | PRN

## 2022-07-12 MED ORDER — PROMETHAZINE HCL 12.5 MG RE SUPP
12.5000 mg | Freq: Four times a day (QID) | RECTAL | Status: DC | PRN
  Administered 2022-07-12 – 2022-07-13 (×2): 12.5 mg via RECTAL
  Filled 2022-07-12 (×5): qty 1

## 2022-07-12 MED ORDER — DIPHENHYDRAMINE-ZINC ACETATE 2-0.1 % EX CREA
TOPICAL_CREAM | Freq: Three times a day (TID) | CUTANEOUS | Status: DC | PRN
  Administered 2022-07-17: 1 via TOPICAL
  Filled 2022-07-12: qty 28

## 2022-07-12 MED ORDER — KETOROLAC TROMETHAMINE 15 MG/ML IJ SOLN
15.0000 mg | Freq: Four times a day (QID) | INTRAMUSCULAR | Status: AC
  Administered 2022-07-12 – 2022-07-17 (×20): 15 mg via INTRAVENOUS
  Filled 2022-07-12 (×20): qty 1

## 2022-07-12 MED ORDER — ACETAMINOPHEN 10 MG/ML IV SOLN
1000.0000 mg | Freq: Four times a day (QID) | INTRAVENOUS | Status: AC
  Administered 2022-07-12 – 2022-07-13 (×3): 1000 mg via INTRAVENOUS
  Filled 2022-07-12 (×4): qty 100

## 2022-07-12 MED ORDER — ACETAMINOPHEN 10 MG/ML IV SOLN: 1000.0000 mg | Freq: Four times a day (QID) | INTRAVENOUS | Status: DC | PRN

## 2022-07-12 MED ORDER — SODIUM CHLORIDE 0.9 % IV SOLN
8.0000 mg | Freq: Four times a day (QID) | INTRAVENOUS | Status: DC
  Administered 2022-07-12 – 2022-07-17 (×19): 8 mg via INTRAVENOUS
  Filled 2022-07-12 (×23): qty 4

## 2022-07-12 MED ORDER — LORAZEPAM 2 MG/ML IJ SOLN
1.0000 mg | Freq: Once | INTRAMUSCULAR | Status: AC
  Administered 2022-07-12: 1 mg via INTRAVENOUS
  Filled 2022-07-12: qty 1

## 2022-07-12 NOTE — Progress Notes (Addendum)
HD#0 Subjective:   Summary: This is a 32 year old female with a past medical history of hyperemesis gravidarum, cyclic vomiting syndrome who presents emergency department for intractable nausea and vomiting and admitted for further evaluation management.  Overnight Events: Overnight, patient asked for Dilaudid and Benadryl multiple times.  Did receive .25 mg Dilaudid overnight.  Patient evaluated bedside this morning.  She states she is having excruciating 10 out of 10 pain.  She states she did not sleep all night.  She vomited about 15 times last night.  She states her most recent vomiting episode was with some bright red blood.  She denies any fecal matter in her blood.  She denies any bowel movements.  She states she has not been able to keep anything down.  Objective:  Vital signs in last 24 hours: Vitals:   07/12/22 0626 07/12/22 0726 07/12/22 0752 07/12/22 1221  BP:   (!) 164/95 (!) 158/95  Pulse:   (!) 59 (!) 57  Resp: 17  18 18   Temp:   98.4 F (36.9 C) 98.3 F (36.8 C)  TempSrc:   Oral Oral  SpO2: 100% 100% 100% 100%  Weight:      Height:       Supplemental O2: Room Air SpO2: 100 %   Physical Exam:  Constitutional: Resting in bed, in fetal position HENT: normocephalic atraumatic Cardiovascular: regular rate and rhythm, no m/r/g Pulmonary/Chest: normal work of breathing on room air, lungs clear to auscultation bilaterally Abdominal: Soft, with diffuse tenderness, with some guarding, normoactive bowel sounds Extremities: No pitting edema appreciated  Filed Weights   07/11/22 0258  Weight: 67.7 kg    No intake or output data in the 24 hours ending 07/12/22 1457  Net IO Since Admission: 46.19 mL [07/12/22 1457]  Pertinent Labs:    Latest Ref Rng & Units 07/12/2022    3:11 AM 07/11/2022    3:05 AM 04/26/2022    2:58 PM  CBC  WBC 4.0 - 10.5 K/uL 11.0  7.1  5.3   Hemoglobin 12.0 - 15.0 g/dL 16.1  09.6  04.5   Hematocrit 36.0 - 46.0 % 41.3  43.2  38.0    Platelets 150 - 400 K/uL 278  283  273        Latest Ref Rng & Units 07/12/2022    3:11 AM 07/11/2022    3:05 AM 04/26/2022    2:58 PM  CMP  Glucose 70 - 99 mg/dL 409  811  85   BUN 6 - 20 mg/dL 8  9  6    Creatinine 0.44 - 1.00 mg/dL 9.14  7.82  9.56   Sodium 135 - 145 mmol/L 138  136  136   Potassium 3.5 - 5.1 mmol/L 3.2  4.1  3.6   Chloride 98 - 111 mmol/L 103  103  103   CO2 22 - 32 mmol/L 23  24  25    Calcium 8.9 - 10.3 mg/dL 8.9  9.3  9.3   Total Protein 6.5 - 8.1 g/dL  7.7  7.3   Total Bilirubin 0.3 - 1.2 mg/dL  0.5  0.6   Alkaline Phos 38 - 126 U/L  70  68   AST 15 - 41 U/L  22  33   ALT 0 - 44 U/L  20  31     Imaging: No results found.  Assessment/Plan:   Active Problems:   Intractable nausea and vomiting   Patient Summary: Krista Stout is a 32  y.o. female with a past medical history of hyperemesis gravidarum, cyclic vomiting syndrome who presents emergency department for intractable nausea and vomiting and admitted for further evaluation management.  #Intractable nausea and vomiting #Leukocytosis Patient evaluated bedside this morning.  Patient still is having intractable nausea and vomiting despite supportive care.  Patient reports having 15 vomiting episodes overnight.  Did speak with nurse, and nurse did not appreciate any of these overnight, nor do I see this documented.  Patient requested for pain medicine and nausea medicine many times overnight.  Did have long conversation with patient about the importance of a stepwise approach to pain control and nausea control.  Patient was requesting IV Dilaudid, but was refusing IV Tylenol and IV Toradol.  Did discuss that it is important for her to try Tylenol and Toradol before escalating to Dilaudid.  She did voiced understanding, but stated that these medicines have not helped in the past, did not want to try them.  We also discussed the importance of antiemetics and how some can prolong QT interval and that she needs  to be on telemetry.  Patient is agreeable to this.  Will continue with supportive care.  Patient did have elevation white count likely reactive.  Will continue to monitor.  Underlying etiology likely secondary to cyclic vomiting syndrome versus cannabis hyperemesis syndrome. -Daily EKGs -Supportive care -IV Tylenol every 6 hours as needed -IV Toradol every 6 hours -As needed IV Dilaudid 0.25 mg -Antiemetic with Zofran, Tigan, Phenergan suppositories -Counseled patient on cessation of THC use as this could be contributing  #Hypokalemia Potassium 3.2 this morning.  Likely secondary to poor p.o. intake. -Replete potassium -Recheck in morning  #Adnexal cyst 5.2 cm left adnexal cyst.  Could be contributing to pain. -Supportive care as above -Follow-up outpatient  #Prolonged QTc interval -Daily EKGs  Diet: NPO IVF: None,None VTE: Enoxaparin Code: Full PT/OT recs: None, none.  Dispo: Anticipated discharge to Home in 3 days pending clinical improvement.   Modena Slater DO Internal Medicine Resident PGY-1 [phone] Please contact the on call pager after 5 pm and on weekends at [phone].

## 2022-07-12 NOTE — Progress Notes (Signed)
VSS. Rounds and  Q2T completed. Patient turned self. Patient in bed resting. No signs of distress at this time. Will continue to monitor. 

## 2022-07-12 NOTE — Progress Notes (Signed)
HD#0 SUBJECTIVE:  Patient Summary: Krista Stout is a 32 y.o. with a pertinent PMH of hyperemesis gravidarum, cyclic vomiting syndrome, who presented with intractable nausea and vomiting with abdominal pain and admitted for intractable nausea and vomiting.   Overnight Events: NAEON.  Interim History: Patient seated upright in bed, has vomit bag and spitting into it. Seems more comfortable than previous days and verbalizes this as well. Is wondering if she can get reglan today, asks about benadryl.  OBJECTIVE:  Vital Signs: Vitals:   07/12/22 0626 07/12/22 0726 07/12/22 0752 07/12/22 1221  BP:   (!) 164/95 (!) 158/95  Pulse:   (!) 59 (!) 57  Resp: 17  18 18   Temp:   98.4 F (36.9 C) 98.3 F (36.8 C)  TempSrc:   Oral Oral  SpO2: 100% 100% 100% 100%  Weight:      Height:       Supplemental O2: Room Air SpO2: 100 %  Filed Weights   07/11/22 0258  Weight: 67.7 kg    No intake or output data in the 24 hours ending 07/12/22 1501 Net IO Since Admission: 46.19 mL [07/12/22 1501]  Physical Exam: Constitutional:Seated upright in bed, holding vomit baggie but not actively vomiting. In no acute distress. Cardio:Regular rate and rhythm. No murmurs, rubs, or gallops. Pulm:Clear to auscultation bilaterally. Normal work of breathing on room air. Abdomen:Soft, diffuse mild tenderness but without guarding or rebound tenderness. Skin:Warm and dry. Neuro:Alert and oriented x3. No focal deficit noted. Psych:Normal mood and affect.  Patient Lines/Drains/Airways Status     Active Line/Drains/Airways     Name Placement date Placement time Site Days   Peripheral IV 07/12/22 22 G 1.75" Anterior;Right Forearm 07/12/22  1021  Forearm  less than 1             ASSESSMENT/PLAN:  Assessment: Active Problems:   Intractable nausea and vomiting   Plan: #Intractable nausea and vomiting #Prolonged QTc interval Patient felt like combination of pain regimen has helped with her pain  and she has been able to sleep for more than an hour at a time. She says her stomach feels sore, like she has ran a marathon, and understandably wants to know when this will get better. I explained that as we get better control of her retching, I expect to see improvement of her pain. I set the expectation of a pain goal to closer to a 6/10 as complete resolution of pain/soreness is not likely to happen this hospitalization. She verbalized understanding to this. I do appreciate improvement of her abdominal pain today as she did not guard or resist abdominal palpation and was seated in bed rather than curled up and laying as she was yesterday. She has a comprehensive pain regimen in place. She does ask about benadryl for itching that she gets with zofran however I reiterated that while she is receiving dilaudid, even if separated by time of administration, that benadryl is not a treatment option. She voiced understanding to this.  While I do not see documented episodes of emesis overnight, patient endorses continued episodes. She explains that vomiting occurs with any attempt at PO intake instantly, and that it is green/yellow/undigested food in appearance and this is documented in a nursing note as well.  She has an extensive antiemetic regimen and is getting daily EKGs as well as on telemetry to monitor for prolonged Qtc or arrhythmia in setting of multiple Qtc prolonging agents and mild electrolyte derangements from ongoing GI losses. Today  Qtc 467 ms.  She remains afebrile, leukocytosis noted yesterday has resolved. EKG with sinus bradycardia but otherwise HDS. Compliant with telemetry as requested. I do not think there is a need to pursue additional imaging at this point with pelvic ultrasound given some improvement and no signs of infection, but will keep this in mind if no ongoing progression over next 24-48h. -Will add a heating pad for her to use over her abdomen for cramping, soreness -Multimodal  pain regimen: IV Tylenol 1000 mg every 6 hours, toradol 15 mg IV q6h, dilaudid 0.25 mg q6h for severe breakthrough pain ONLY, after patient has appropriately received tylenol and toradol, and bentyl 10 mg IM TID. Will plan to d/c bentyl tomorrow if no noticeable improvement in symptoms as length of treatment with IM bentyl should be limited to ~1 day. -Multimodal antiemetic regimen: Zofran 8 mg IV q6h, phenergan 12.5 mg suppository q6h PRN N/V, scopolamine 1.5 mg patch q72h, tigan 200 mg IM q8h PRN n/v, reglan 5 mg IV q8h PRN refractory n/v -Can get benadryl cream for itching but no IV benadryl while receiving dilaudid -1L LR bolus in setting of ongoing GI losses, no PO intake in several days, sinus bradycardia -Daily EKGs, telemetry   #Hypokalemia K today 3.2. Suspect 2/2 poor PO intake, vomiting.  -Trend BMP, replete electrolytes as needed   #Adnexal cyst Incidentally noted on imaging at admission, 5.2 cm left adnexal cyst.  -Recommend OP f/u   Best Practice: Diet: Soft, as tolerated IVF: Fluids: LR, Rate:  1000 cc bolus VTE: enoxaparin (LOVENOX) injection 40 mg Start: 07/11/22 1330 Code: Full AB: None DISPO: Anticipated discharge pending  medical stability .  Signature: Champ Mungo, D.O.  Internal Medicine Resident, PGY-2 [facility]  Pager: [phone]   Please contact the on call pager after 5 pm and on weekends at [phone].

## 2022-07-12 NOTE — Plan of Care (Signed)
°  Problem: Clinical Measurements: °Goal: Ability to maintain clinical measurements within normal limits will improve °Outcome: Progressing °  °Problem: Activity: °Goal: Risk for activity intolerance will decrease °Outcome: Progressing °  °Problem: Nutrition: °Goal: Adequate nutrition will be maintained °Outcome: Progressing °  °

## 2022-07-12 NOTE — Progress Notes (Signed)
MD on call made aware that the patient is requesting benadryl and dilaudid.

## 2022-07-12 NOTE — Progress Notes (Addendum)
MD on call made aware that the patient wants to speak with MD on call about pain management. RN educated patient on current pain medication orders. Patient verbalized understanding and continues to want to speak with the doctor.

## 2022-07-12 NOTE — Plan of Care (Signed)

## 2022-07-12 NOTE — Progress Notes (Signed)
MD on call paged r/t patient requesting benadryl. MD made aware.

## 2022-07-13 DIAGNOSIS — R112 Nausea with vomiting, unspecified: Secondary | ICD-10-CM | POA: Diagnosis not present

## 2022-07-13 DIAGNOSIS — D72829 Elevated white blood cell count, unspecified: Secondary | ICD-10-CM | POA: Diagnosis not present

## 2022-07-13 DIAGNOSIS — E876 Hypokalemia: Secondary | ICD-10-CM | POA: Diagnosis not present

## 2022-07-13 DIAGNOSIS — R9431 Abnormal electrocardiogram [ECG] [EKG]: Secondary | ICD-10-CM | POA: Diagnosis not present

## 2022-07-13 LAB — CBC
HCT: 41.9 % (ref 36.0–46.0)
Hemoglobin: 14.2 g/dL (ref 12.0–15.0)
MCH: 30 pg (ref 26.0–34.0)
MCHC: 33.9 g/dL (ref 30.0–36.0)
MCV: 88.4 fL (ref 80.0–100.0)
Platelets: 267 10*3/uL (ref 150–400)
RBC: 4.74 MIL/uL (ref 3.87–5.11)
RDW: 14.7 % (ref 11.5–15.5)
WBC: 7.8 10*3/uL (ref 4.0–10.5)
nRBC: 0 % (ref 0.0–0.2)

## 2022-07-13 LAB — BASIC METABOLIC PANEL
Anion gap: 11 (ref 5–15)
BUN: 11 mg/dL (ref 6–20)
CO2: 24 mmol/L (ref 22–32)
Calcium: 8.8 mg/dL — ABNORMAL LOW (ref 8.9–10.3)
Chloride: 101 mmol/L (ref 98–111)
Creatinine, Ser: 0.87 mg/dL (ref 0.44–1.00)
GFR, Estimated: >60 mL/min (ref >60)
Glucose, Bld: 95 mg/dL (ref 70–99)
Potassium: 3.2 mmol/L — ABNORMAL LOW (ref 3.5–5.1)
Sodium: 136 mmol/L (ref 135–145)

## 2022-07-13 MED ORDER — PROMETHAZINE HCL 12.5 MG RE SUPP
12.5000 mg | Freq: Four times a day (QID) | RECTAL | Status: DC | PRN
  Administered 2022-07-14 – 2022-07-16 (×5): 12.5 mg via RECTAL
  Filled 2022-07-13 (×9): qty 1

## 2022-07-13 MED ORDER — HYDROMORPHONE HCL 1 MG/ML IJ SOLN: 0.2500 mg | Freq: Four times a day (QID) | INTRAMUSCULAR | Status: DC | PRN

## 2022-07-13 MED ORDER — ACETAMINOPHEN 10 MG/ML IV SOLN
1000.0000 mg | Freq: Four times a day (QID) | INTRAVENOUS | Status: AC
  Administered 2022-07-13 – 2022-07-14 (×4): 1000 mg via INTRAVENOUS
  Filled 2022-07-13 (×4): qty 100

## 2022-07-13 MED ORDER — POTASSIUM CHLORIDE 10 MEQ/100ML IV SOLN
10.0000 meq | INTRAVENOUS | Status: AC
  Administered 2022-07-13 (×4): 10 meq via INTRAVENOUS
  Filled 2022-07-13 (×4): qty 100

## 2022-07-13 MED ORDER — HYDROMORPHONE HCL 1 MG/ML IJ SOLN
0.2500 mg | Freq: Four times a day (QID) | INTRAMUSCULAR | Status: DC | PRN
  Administered 2022-07-13 – 2022-07-15 (×7): 0.25 mg via INTRAVENOUS
  Filled 2022-07-13 (×7): qty 0.5

## 2022-07-13 MED ORDER — LACTATED RINGERS IV BOLUS
1000.0000 mL | Freq: Once | INTRAVENOUS | Status: AC
  Administered 2022-07-13: 500 mL via INTRAVENOUS

## 2022-07-13 MED ORDER — ACETAMINOPHEN 10 MG/ML IV SOLN: 1000.0000 mg | Freq: Four times a day (QID) | INTRAVENOUS | Status: DC

## 2022-07-13 NOTE — Progress Notes (Signed)
Patient had multiple episode of vomiting overnight despite with sched zofran, and other prn antiemetic adm. Clear brown emesis ( gastric contents/undigested foods). Reported itchiness due to zofran, was on camphor mentol lotion, requested to change to benadryl lotion also   requested to be seen by a Provider. Dr. Marrianne Mood came evaluate patient. Patient reported still not working. Provider on call made aware.

## 2022-07-13 NOTE — Progress Notes (Addendum)
HD#1 SUBJECTIVE:  Patient Summary: Krista Stout is a 32 y.o. with a pertinent PMH of hyperemesis gravidarum, cyclic vomiting syndrome, who presented with intractable nausea and vomiting with abdominal pain and admitted for intractable nausea and vomiting.   Overnight Events: NAEON.  Interim History: Patient continued to vomit overnight but has noticed improvement in her pain, which has allowed her to sleep for more than an hour at a time. She is eager to use the heating pad on her abdomen today.   OBJECTIVE:  Vital Signs: Vitals:   07/12/22 1615 07/12/22 2133 07/13/22 0700 07/13/22 0740  BP: (!) 147/88 (!) 156/83 (!) 179/100 (!) 167/95  Pulse: (!) 59 (!) 57 (!) 54 (!) 58  Resp: 19   18  Temp: 98.7 F (37.1 C) 98 F (36.7 C) 98 F (36.7 C) 98.1 F (36.7 C)  TempSrc: Oral     SpO2: 100% 100% 100% 100%  Weight:      Height:       Supplemental O2: Room Air SpO2: 100 %  Filed Weights   07/11/22 0258  Weight: 67.7 kg     Intake/Output Summary (Last 24 hours) at 07/13/2022 1640 Last data filed at 07/13/2022 0600 Gross per 24 hour  Intake 362 ml  Output --  Net 362 ml   Net IO Since Admission: 408.19 mL [07/13/22 1640]  Physical Exam: Constitutional:Seated upright in bed, in no acute distress. Cardio:Regular rate and rhythm. No murmurs, rubs, or gallops. Pulm:Clear to auscultation bilaterally. Normal work of breathing on room air. Abdomen:Soft, diffuse mild tenderness but without guarding or rebound tenderness when palpated while giving patient distraction. Guards without distraction. Skin:Warm and dry. Neuro:Alert and oriented x3. No focal deficit noted. Psych:Normal mood and affect.  Patient Lines/Drains/Airways Status     Active Line/Drains/Airways     Name Placement date Placement time Site Days   Peripheral IV 07/12/22 22 G 1.75" Anterior;Right Forearm 07/12/22  1021  Forearm  less than 1             ASSESSMENT/PLAN:  Assessment: Active  Problems:   Intractable nausea and vomiting   Plan: #Intractable nausea and vomiting #Prolonged QTc interval N/V subjectively have not had improvement however she is gradually able to rest for longer without retching and fortunately pain is becoming slowly more tolerable. Yesterday I adjusted anti-emetic regimen to space out her various agents to provide more continuous support. I have asked that she try taking small sips today after she gets each antiemetic to see if there is any progress. She still has diffuse soreness/abdominal tenderness, like she ran a marathon, but was able to sleep for longer periods of time overnight.   She has an extensive antiemetic regimen and is getting daily EKGs as well as on telemetry to monitor for prolonged Qtc or arrhythmia in setting of multiple Qtc prolonging agents and mild electrolyte derangements from ongoing GI losses. Today Qtc 477 ms.  She remains afebrile, without leukocytosis. EKG with sinus bradycardia but otherwise HDS. On chart review, she has had sinus bradycardia with prior hospitalizations for intractable nausea and vomiting as well. Asymptomatic. Compliant with telemetry as requested. I do not think there is a need to pursue additional imaging at this point with pelvic ultrasound given continued small improvements and no signs of infection, but will keep this in mind if no ongoing progression over next 24-48h.  Would consider extending time between available dilaudid doses 07/01 +/- attempting to switch to oral medication if she is better  able to tolerate PO intake.  -Multimodal pain regimen: IV Tylenol 1000 mg every 6 hours, toradol 15 mg IV q6h, dilaudid 0.25 mg q6h for severe breakthrough pain ONLY, after patient has appropriately received tylenol and toradol, heating pad.  -Will add capsaicin cream as well -Multimodal antiemetic regimen: Zofran 8 mg IV q6h, phenergan 12.5 mg suppository q6h PRN N/V, scopolamine 1.5 mg patch q72h, tigan 200 mg  IM q8h PRN n/v, reglan 5 mg IV q8h PRN refractory n/v -Can get benadryl cream for itching but no IV benadryl while receiving dilaudid -Daily EKGs, telemetry   #Hypokalemia K today 3.3. Suspect 2/2 poor PO intake, vomiting. She was unable to tolerate IV K repletion due to pain this morning so will opt for D5NS+KCl. -D5NS+KCl 100 cc/h x 10h for K repletion and volume support -Trend BMP, replete electrolytes as needed   #Adnexal cyst Incidentally noted on imaging at admission, 5.2 cm left adnexal cyst.  -Recommend OP f/u   Best Practice: Diet: Soft, as tolerated IVF: Fluids: LR, Rate:  1000 cc bolus VTE: Place and maintain sequential compression device Start: 07/12/22 1502 Code: Full AB: None DISPO: Anticipated discharge pending  medical stability .  Signature: Champ Mungo, D.O.  Internal Medicine Resident, PGY-2 [facility]  Pager: [phone]   Please contact the on call pager after 5 pm and on weekends at [phone].

## 2022-07-14 DIAGNOSIS — D72829 Elevated white blood cell count, unspecified: Secondary | ICD-10-CM | POA: Diagnosis not present

## 2022-07-14 DIAGNOSIS — E876 Hypokalemia: Secondary | ICD-10-CM | POA: Diagnosis not present

## 2022-07-14 DIAGNOSIS — R112 Nausea with vomiting, unspecified: Secondary | ICD-10-CM | POA: Diagnosis not present

## 2022-07-14 DIAGNOSIS — R9431 Abnormal electrocardiogram [ECG] [EKG]: Secondary | ICD-10-CM | POA: Diagnosis not present

## 2022-07-14 LAB — CBC
HCT: 41.2 % (ref 36.0–46.0)
Hemoglobin: 13.6 g/dL (ref 12.0–15.0)
MCH: 29.2 pg (ref 26.0–34.0)
MCHC: 33 g/dL (ref 30.0–36.0)
MCV: 88.4 fL (ref 80.0–100.0)
Platelets: 238 10*3/uL (ref 150–400)
RBC: 4.66 MIL/uL (ref 3.87–5.11)
RDW: 14.1 % (ref 11.5–15.5)
WBC: 7.5 10*3/uL (ref 4.0–10.5)
nRBC: 0 % (ref 0.0–0.2)

## 2022-07-14 LAB — BASIC METABOLIC PANEL
Anion gap: 11 (ref 5–15)
BUN: 11 mg/dL (ref 6–20)
CO2: 22 mmol/L (ref 22–32)
Calcium: 8.6 mg/dL — ABNORMAL LOW (ref 8.9–10.3)
Chloride: 99 mmol/L (ref 98–111)
Creatinine, Ser: 0.76 mg/dL (ref 0.44–1.00)
GFR, Estimated: >60 mL/min (ref >60)
Glucose, Bld: 69 mg/dL — ABNORMAL LOW (ref 70–99)
Potassium: 3.3 mmol/L — ABNORMAL LOW (ref 3.5–5.1)
Sodium: 132 mmol/L — ABNORMAL LOW (ref 135–145)

## 2022-07-14 LAB — MAGNESIUM: Magnesium: 1.8 mg/dL (ref 1.7–2.4)

## 2022-07-14 LAB — GLUCOSE, CAPILLARY: Glucose-Capillary: 70 mg/dL (ref 70–99)

## 2022-07-14 MED ORDER — POTASSIUM CHLORIDE 10 MEQ/100ML IV SOLN
10.0000 meq | INTRAVENOUS | Status: AC
  Administered 2022-07-14 (×2): 10 meq via INTRAVENOUS
  Filled 2022-07-14 (×2): qty 100

## 2022-07-14 MED ORDER — POTASSIUM CL IN DEXTROSE 5% 20 MEQ/L IV SOLN: 20.0000 meq | INTRAVENOUS | Status: DC

## 2022-07-14 MED ORDER — ACETAMINOPHEN 10 MG/ML IV SOLN
1000.0000 mg | Freq: Four times a day (QID) | INTRAVENOUS | Status: AC
  Administered 2022-07-14 – 2022-07-15 (×4): 1000 mg via INTRAVENOUS
  Filled 2022-07-14 (×4): qty 100

## 2022-07-14 MED ORDER — CAPSAICIN 0.025 % EX CREA
TOPICAL_CREAM | Freq: Four times a day (QID) | CUTANEOUS | Status: DC
  Filled 2022-07-14 (×2): qty 60

## 2022-07-14 MED ORDER — KCL IN DEXTROSE-NACL 20-5-0.9 MEQ/L-%-% IV SOLN
INTRAVENOUS | Status: AC
  Filled 2022-07-14 (×2): qty 1000

## 2022-07-15 ENCOUNTER — Inpatient Hospital Stay (HOSPITAL_COMMUNITY): Payer: Medicaid - Out of State

## 2022-07-15 DIAGNOSIS — R9431 Abnormal electrocardiogram [ECG] [EKG]: Secondary | ICD-10-CM | POA: Diagnosis not present

## 2022-07-15 DIAGNOSIS — D72829 Elevated white blood cell count, unspecified: Secondary | ICD-10-CM | POA: Diagnosis not present

## 2022-07-15 DIAGNOSIS — R112 Nausea with vomiting, unspecified: Secondary | ICD-10-CM | POA: Diagnosis not present

## 2022-07-15 LAB — BASIC METABOLIC PANEL
Anion gap: 9 (ref 5–15)
BUN: 6 mg/dL (ref 6–20)
CO2: 24 mmol/L (ref 22–32)
Calcium: 8.3 mg/dL — ABNORMAL LOW (ref 8.9–10.3)
Chloride: 101 mmol/L (ref 98–111)
Creatinine, Ser: 0.83 mg/dL (ref 0.44–1.00)
GFR, Estimated: >60 mL/min (ref >60)
Glucose, Bld: 93 mg/dL (ref 70–99)
Potassium: 3.1 mmol/L — ABNORMAL LOW (ref 3.5–5.1)
Sodium: 134 mmol/L — ABNORMAL LOW (ref 135–145)

## 2022-07-15 LAB — MAGNESIUM: Magnesium: 1.9 mg/dL (ref 1.7–2.4)

## 2022-07-15 MED ORDER — ENSURE ENLIVE PO LIQD
237.0000 mL | Freq: Two times a day (BID) | ORAL | Status: DC
  Administered 2022-07-15: 237 mL via ORAL

## 2022-07-15 MED ORDER — HYDROMORPHONE HCL 1 MG/ML IJ SOLN
0.2500 mg | Freq: Three times a day (TID) | INTRAMUSCULAR | Status: DC | PRN
  Administered 2022-07-15 – 2022-07-16 (×3): 0.25 mg via INTRAVENOUS
  Filled 2022-07-15 (×3): qty 0.5

## 2022-07-15 MED ORDER — ENSURE ENLIVE PO LIQD
237.0000 mL | Freq: Three times a day (TID) | ORAL | Status: DC
  Administered 2022-07-17 (×2): 237 mL via ORAL

## 2022-07-15 MED ORDER — KCL IN DEXTROSE-NACL 20-5-0.9 MEQ/L-%-% IV SOLN
INTRAVENOUS | Status: AC
  Filled 2022-07-15: qty 1000

## 2022-07-15 MED ORDER — POTASSIUM CHLORIDE 10 MEQ/100ML IV SOLN
10.0000 meq | INTRAVENOUS | Status: AC
  Administered 2022-07-15 (×4): 10 meq via INTRAVENOUS
  Filled 2022-07-15 (×4): qty 100

## 2022-07-15 NOTE — Progress Notes (Signed)
Initial Nutrition Assessment  DOCUMENTATION CODES:   Not applicable  INTERVENTION:   If pt unable to tolerate anything by mouth and unable to tolerate enteral access in the next few days, recommend initiation of TPN  Pt trying Boost Breeze today; ok for patient to substitute any other oral nutrition supplement for Ensure Enlive po TID  (each supplement provides 350 kcal and 20 grams of protein). Not changing order yet as pt unsure which supplement she might be able to keep down  Continue GI soft diet as able   NUTRITION DIAGNOSIS:   Inadequate oral intake related to acute illness, vomiting, nausea, inability to eat as evidenced by meal completion < 25%.  GOAL:   Patient will meet greater than or equal to 90% of their needs  MONITOR:   PO intake, Supplement acceptance, Labs, Weight trends  REASON FOR ASSESSMENT:   Consult, Malnutrition Screening Tool Enteral/tube feeding initiation and management  ASSESSMENT:   32 yo female admitted with severe cyclic vomiting syndrome. Noted pt with hx of hyperemesis gravidarum and cyclic vomiting in the past, even with PEG tube placement several years ago. UDS + for THC.  Cortrak placed today. Upon RD arrival to room for assessment, Cortrak had already been removed ( RN removed but at request of patient). Pt reports she cannot tolerate the Cortrak right now, dry heaving, vomiting after Cortrak inserted.   Pt reports she vomited throughout the night despite only having ice chips and water. +severe abdominal pain  UDS +THC, denies hx of migraines or severe headaches. Pt has been encouraged to stop THC use as this can be a cause of cyclic vomiting.   Pt has multiple IV meds for pain in addition to multiple antiemetics (zofran, phenergan, scopolamine, tigan and reglan) which are all prn. Discussed possibility of changing one med to scheduled to get ahead of the nausea with Physician Resident Team. Given prolonged QTc, keeping prn and using  cautiously given most of these are QTc prolonging.   Pt has been unable to tolerate much of anything, not even ice/water, since Tuesday of last week 6/25. Prior to this, pt reports she had a good appetite and was eating well. Pt with minimal intake x 6 days  Pt unsure if she hs lost any weight recently or not; pt reports UBW of 150 pounds. Noted only weight from day of admission (likely stated/pulled forward) as pt with exact same weight in April of this year. Requested new measured weight.   Labs: sodium 134 (wdl), potassium 3.1 (wdl), Creatinine wdl.  Meds: D5-NS with KCl at 100 ml/hr   NUTRITION - FOCUSED PHYSICAL EXAM:  Flowsheet Row Most Recent Value  Orbital Region No depletion  Upper Arm Region No depletion  Thoracic and Lumbar Region No depletion  Buccal Region No depletion  Temple Region No depletion  Clavicle Bone Region No depletion  Clavicle and Acromion Bone Region No depletion  Scapular Bone Region No depletion  Dorsal Hand No depletion  Patellar Region No depletion  Anterior Thigh Region No depletion  Posterior Calf Region No depletion  Edema (RD Assessment) None       Diet Order:   Diet Order             DIET SOFT Room service appropriate? Yes; Fluid consistency: Thin  Diet effective now                   EDUCATION NEEDS:   Education needs have been addressed  Skin:  Skin Assessment:  Reviewed RN Assessment  Last BM:  6/28  Height:   Ht Readings from Last 1 Encounters:  07/11/22 5\' 2"  (1.575 m)    Weight:   Wt Readings from Last 1 Encounters:  07/15/22 62.6 kg    BMI:  Body mass index is 25.26 kg/m.  Estimated Nutritional Needs:   Kcal:  1800-2000 kcals  Protein:  90-105 g  Fluid:  >/= 1.8 L   Romelle Starcher MS, RDN, LDN, CNSC Registered Dietitian 3 Clinical Nutrition RD Pager and On-Call Pager Number Located in [place]

## 2022-07-15 NOTE — Progress Notes (Signed)
HD#4 SUBJECTIVE:  Patient Summary: Krista Stout is a 32 y.o. with a pertinent PMH of hyperemesis gravidarum, cyclic vomiting syndrome, who presented with intractable nausea and vomiting with abdominal pain and admitted for intractable nausea and vomiting.   Overnight Events: NAEON.  Interim History: . The patient was evaluated at the bedside this morning. She reports that she was vomiting throughout the night despite only having ice chips and sips of water. Her pain is still severe, noting that she feels like she ran a marathon and that she feels quite weak. She notes that the last real meal she has eaten was 6 days ago.   OBJECTIVE:  Vital Signs: Vitals:   07/14/22 1525 07/14/22 2009 07/15/22 0600 07/15/22 0900  BP: (!) 156/101 (!) 166/93 (!) 142/93 (!) 153/91  Pulse: (!) 59 (!) 58 (!) 57 (!) 55  Resp: 16 15 15 17   Temp: 99.1 F (37.3 C) 100.1 F (37.8 C) 98.5 F (36.9 C) 98.3 F (36.8 C)  TempSrc: Oral Oral Oral Oral  SpO2: 100% 99% 98% 100%  Weight:      Height:       Supplemental O2: Room Air SpO2: 100 %  Filed Weights   07/11/22 0258  Weight: 67.7 kg     Intake/Output Summary (Last 24 hours) at 07/15/2022 1208 Last data filed at 07/14/2022 1500 Gross per 24 hour  Intake 372.62 ml  Output --  Net 372.62 ml   Net IO Since Admission: 480.46 mL [07/15/22 1208]  Physical Exam: Constitutional: ill appearing young female, no acute distress Cardio: Regular rate and rhythm, no murmurs.  Pulm: CTAB. Normal effort.  Abdomen: Soft, diffuse mild tenderness to palpation but no guarding.  Skin: Warm, dry. Neuro: No focal deficit. Psych: Normal mood and affect   ASSESSMENT/PLAN:  Assessment: Active Problems:   Intractable nausea and vomiting   Plan: #Intractable nausea and vomiting #Prolonged QTc interval N/V worsened overnight as patient stated she vomited an innumerable amount throughout the night. She has an extensive antiemetic regimen with zofran,  phenergan, scopolamine, tigan, and reglan and is getting daily EKGs as well as on telemetry to monitor for prolonged Qtc or arrhythmia in setting of multiple Qtc prolonging agents and mild electrolyte derangements from ongoing GI losses. Today, Qtc 492 ms, which increased from 482 ms yesterday. The patient has not had a full meal in 6 days and thus, is not receiving adequate nutrition so we discussed Cortrak placement with the patient today. -Multimodal pain regimen: IV Tylenol 1000 mg every 6 hours, toradol 15 mg IV q6h, dilaudid 0.25 mg decreased to q8h PRN from q6h PRN  -Multimodal antiemetic regimen: Zofran 8 mg IV q6h, phenergan 12.5 mg suppository q6h PRN N/V, scopolamine 1.5 mg patch q72h, tigan 200 mg IM q8h PRN n/v, reglan 5 mg IV q8h PRN refractory n/v -Can get benadryl cream for itching but no IV benadryl while receiving dilaudid -Daily EKGs, telemetry - Consult placed for Cortrak placement    #Hypokalemia K today 3.1. Suspect 2/2 poor PO intake, vomiting. -D5NS+20 mEq KCl 100 cc/h x 12h for K repletion and volume support -IV Kcl 10 mEq x 4 doses today -Trend BMP, replete electrolytes as needed   #Adnexal cyst Incidentally noted on imaging at admission, 5.2 cm left adnexal cyst.  -Recommend OP f/u  Best Practice: Diet: Soft, as tolerated IVF: Fluids: D5NS 100 cc/hr, IV Kcl 10 mEq x 4 doses today VTE: Place and maintain sequential compression device Start: 07/12/22 1502 Code: Full AB:  None DISPO: Anticipated discharge pending  medical stability .  Signature: Morrie Sheldon, M.D.  Internal Medicine Resident, PGY-1 [facility]  Pager: [phone]   Please contact the on call pager after 5 pm and on weekends at [phone].

## 2022-07-15 NOTE — Progress Notes (Signed)
Pt stated that her right FA where her IV was this morning is numb and has been like this since MD team rounded this AM. Pt is receiving 4 runs of potassium that started this AM. I think the numbness in her arm is coming from this, but I am not sure the area is not infiltrated the IV was leaking I removed it and placed order for new IV so she can finish her last run and other medications. MD team messaged and notified and told me to keep an eye on the area today see if it improves after the Potassium is done.

## 2022-07-15 NOTE — Procedures (Signed)
Cortrak  Person Inserting Tube:  Ozzy Bohlken D, RD Tube Type:  Cortrak - 55 inches Tube Size:  10 Tube Location:  Left nare Secured by: Bridle Technique Used to Measure Tube Placement:  Marking at nare/corner of mouth Cortrak Secured At:  104 cm Procedure Comments:  Cortrak Tube Team Note:  Consult received to place a Cortrak feeding tube.   X-ray is required, abdominal x-ray has been ordered by the Cortrak team. Please confirm tube placement before using the Cortrak tube.   If the tube becomes dislodged please keep the tube and contact the Cortrak team at [email or web address] for replacement.  If after hours and replacement cannot be delayed, place a NG tube and confirm placement with an abdominal x-ray.    Jonetta Dagley, RD, LDN Clinical Dietitian RD pager # available in [facility]  After hours/weekend pager # available in [facility]   

## 2022-07-16 ENCOUNTER — Other Ambulatory Visit: Payer: Self-pay

## 2022-07-16 DIAGNOSIS — R9431 Abnormal electrocardiogram [ECG] [EKG]: Secondary | ICD-10-CM | POA: Diagnosis not present

## 2022-07-16 DIAGNOSIS — R112 Nausea with vomiting, unspecified: Secondary | ICD-10-CM | POA: Diagnosis not present

## 2022-07-16 DIAGNOSIS — E876 Hypokalemia: Secondary | ICD-10-CM | POA: Diagnosis not present

## 2022-07-16 DIAGNOSIS — D72829 Elevated white blood cell count, unspecified: Secondary | ICD-10-CM | POA: Diagnosis not present

## 2022-07-16 LAB — CBC
HCT: 37.9 % (ref 36.0–46.0)
Hemoglobin: 12.7 g/dL (ref 12.0–15.0)
MCH: 29.6 pg (ref 26.0–34.0)
MCHC: 33.5 g/dL (ref 30.0–36.0)
MCV: 88.3 fL (ref 80.0–100.0)
Platelets: 230 10*3/uL (ref 150–400)
RBC: 4.29 MIL/uL (ref 3.87–5.11)
RDW: 13.6 % (ref 11.5–15.5)
WBC: 4.8 10*3/uL (ref 4.0–10.5)
nRBC: 0 % (ref 0.0–0.2)

## 2022-07-16 LAB — BASIC METABOLIC PANEL
Anion gap: 9 (ref 5–15)
BUN: <5 mg/dL — ABNORMAL LOW (ref 6–20)
CO2: 22 mmol/L (ref 22–32)
Calcium: 8.2 mg/dL — ABNORMAL LOW (ref 8.9–10.3)
Chloride: 102 mmol/L (ref 98–111)
Creatinine, Ser: 0.77 mg/dL (ref 0.44–1.00)
GFR, Estimated: >60 mL/min (ref >60)
Glucose, Bld: 98 mg/dL (ref 70–99)
Potassium: 3.2 mmol/L — ABNORMAL LOW (ref 3.5–5.1)
Sodium: 133 mmol/L — ABNORMAL LOW (ref 135–145)

## 2022-07-16 MED ORDER — ENOXAPARIN SODIUM 40 MG/0.4ML IJ SOSY
40.0000 mg | PREFILLED_SYRINGE | INTRAMUSCULAR | Status: DC
  Filled 2022-07-16 (×5): qty 0.4

## 2022-07-16 MED ORDER — KCL IN DEXTROSE-NACL 20-5-0.9 MEQ/L-%-% IV SOLN
INTRAVENOUS | Status: DC
  Filled 2022-07-16 (×2): qty 1000

## 2022-07-16 MED ORDER — HYDROMORPHONE HCL 1 MG/ML IJ SOLN
0.2500 mg | Freq: Two times a day (BID) | INTRAMUSCULAR | Status: AC | PRN
  Administered 2022-07-16: 0.25 mg via INTRAVENOUS
  Filled 2022-07-16: qty 0.5

## 2022-07-16 MED ORDER — POTASSIUM CHLORIDE 10 MEQ/100ML IV SOLN
10.0000 meq | INTRAVENOUS | Status: AC
  Administered 2022-07-16 (×4): 10 meq via INTRAVENOUS
  Filled 2022-07-16 (×3): qty 100

## 2022-07-16 NOTE — TOC CM/SW Note (Signed)
Transition of Care [facility]) - Inpatient Brief Assessment   Patient Details  Name: TAISHMARA SADUSKY MRN: [id number] Date of Birth: 14-Jun-1990  [facility] of [facility]) CM/SW Contact:    Epifanio Lesches, RN Phone Number: 07/16/2022, 1:07 PM   Clinical Narrative: Admitted with severe cyclic vomiting syndrome.  Pt visiting boyfriend in [place] and got sick. Address in [place] is boyfriend's place of residence. Pt states plans to return to [place] once d/c.  Pt with intractable N/V, poor intake. Pt consulted for TPN. PICC line to be place.  TOC team follow and will assist with needs...   Transition of Care Asessment:   Patient has primary care physician: Yes (Pt states has PCP in [place] , can't remeber name) Home environment has been reviewed: From home with family in [place] Prior level of function:: PTA independent with ADL's, no  DME usage Prior/Current Home Services: No current home services Social Determinants of Health Reivew: SDOH reviewed no interventions necessary Readmission risk has been reviewed: No Transition of care needs: no transition of care needs at this time

## 2022-07-16 NOTE — Progress Notes (Addendum)
Brief Nutrition Follow-up:   INTERVENTION:   RD received message from Provider re: TPN initiation today. Agree that TPN initiation is the most appropriate intervention at this time given pt is at high nutritional risk, unable to tolerate po (including ice and sips of water) x 1 week (symptoms started Tuesday of last week). Pt agreeable to Cortrak placement yesterday and tube was placed but worsened pt's N/V and tube subsequently removed.   TPN to start tomorrow; PICC line to be placed. TPN consult to Pharmacy placed too close to 12 pm cutoff and unable to be started today.   Encourage small sips and bites of po as tolerated  Please see Initial Assessment from yesterday for further information. Pt is at risk for refeeding syndrome; recommend initiation of TPN at low rate with slow/cautious titration to goal with close monitoring of electrolytes.   Krista Starcher MS, RDN, LDN, CNSC Registered Dietitian 3 Clinical Nutrition RD Pager and On-Call Pager Number Located in [place]

## 2022-07-16 NOTE — Progress Notes (Signed)
SUBJECTIVE:  Patient Summary: Krista Stout is a 32 y.o. with a pertinent PMH of hyperemesis gravidarum, cyclic vomiting syndrome, who presented with intractable nausea and vomiting with abdominal pain and admitted for intractable nausea and vomiting.   Interim History: Patient was evaluated at bedside. She reports vomiting 5-6 times last night. Vomit was yellowish and occasionally bloody. Last evening, she states she vomited too many times and could not recall a specific amount. Yesterday, she was unable to eat and could only tolerate sips of water. Diarrhea twice and urinated 3-5 times yesterday. No new complaints. Talked with pt about reducing dilaudid and starting TPN.   OBJECTIVE:  Vital Signs: Vitals:   07/15/22 1627 07/15/22 1634 07/15/22 2037 07/16/22 0424  BP: 133/83  (!) 147/83 (!) 119/97  Pulse: (!) 55  (!) 56 (!) 59  Resp: 17     Temp: 98.7 F (37.1 C)  98.6 F (37 C) 98 F (36.7 C)  TempSrc: Oral     SpO2: 99%  100% 100%  Weight:  62.6 kg    Height:       CBC    Component Value Date/Time   WBC 4.8 07/16/2022 0125   RBC 4.29 07/16/2022 0125   HGB 12.7 07/16/2022 0125   HCT 37.9 07/16/2022 0125   PLT 230 07/16/2022 0125   MCV 88.3 07/16/2022 0125   MCH 29.6 07/16/2022 0125   MCHC 33.5 07/16/2022 0125   RDW 13.6 07/16/2022 0125   LYMPHSABS 1.3 03/29/2022 1336   MONOABS 0.3 03/29/2022 1336   EOSABS 0.0 03/29/2022 1336   BASOSABS 0.0 03/29/2022 1336   CMP     Component Value Date/Time   NA 133 (L) 07/16/2022 0125   K 3.2 (L) 07/16/2022 0125   CL 102 07/16/2022 0125   CO2 22 07/16/2022 0125   GLUCOSE 98 07/16/2022 0125   BUN <5 (L) 07/16/2022 0125   CREATININE 0.77 07/16/2022 0125   CALCIUM 8.2 (L) 07/16/2022 0125   PROT 7.7 07/11/2022 0305   ALBUMIN 4.2 07/11/2022 0305   AST 22 07/11/2022 0305   ALT 20 07/11/2022 0305   ALKPHOS 70 07/11/2022 0305   BILITOT 0.5 07/11/2022 0305   GFRNONAA >60 07/16/2022 0125    Supplemental O2: Room Air SpO2:  100 %  Filed Weights   07/11/22 0258 07/15/22 1634  Weight: 67.7 kg 62.6 kg     Intake/Output Summary (Last 24 hours) at 07/16/2022 0735 Last data filed at 07/16/2022 0700 Gross per 24 hour  Intake 1422.3 ml  Output 900 ml  Net 522.3 ml    Net IO Since Admission: 202.76 mL [07/16/22 0735]  Physical Exam: Physical Exam Constitutional:      Appearance: She is well-developed.  Cardiovascular:     Rate and Rhythm: Normal rate and regular rhythm.     Heart sounds: Normal heart sounds.  Pulmonary:     Effort: Pulmonary effort is normal.     Breath sounds: Normal breath sounds.  Abdominal:     Tenderness: There is generalized abdominal tenderness.  Neurological:     Mental Status: She is alert.    ASSESSMENT/PLAN:  Assessment: Active Problems:   Intractable nausea and vomiting  Plan: #Intractable nausea and vomiting #Prolonged QTc interval On 7/2, N/V similar in severity to previous night - reportedly 5-6 times. Her antiemetic regimen remains similar to previous days with zofran, phenergan, scopolamine, tigan, and reglan and is getting daily EKGs as well as on telemetry to monitor for prolonged Qtc or  arrhythmia in setting of multiple Qtc prolonging agents and mild electrolyte derangements from ongoing GI losses. Today, her Qtc is 473 ms, which decreased from 492 ms on 7/1. On 7/1, Cortrak was placed but pulled within one hour due to resultant N/V. On 7/2, because the patient has not had a full meal in 7 days, is not receiving adequate nutrition, and cannot tolerate an ND tube, TPN was discussed. Patient is agreeable to TPN placement.  - TPN labs ordered for 7/3. Plan for TPN placement on 7/3 - Continue multimodal antiemetic regimen: Zofran 8 mg IV q6h, phenergan 12.5 mg suppository q6h PRN N/V, scopolamine 1.5 mg patch q72h, tigan 200 mg IM q8h PRN n/v, reglan 5 mg IV q8h PRN refractory n/v - Can get benadryl cream for itching but no IV benadryl while receiving dilaudid - Daily  EKGs, telemetry  #Pain Patient has had consistent generalized pain described as 10/10. She has been receiving a multimodal pain regimen of IV Tylenol 1000 mg every 6 hours, toradol 15 mg IV q6h, dilaudid 0.25 mg decreased to q12h PRN from q8h PRN. Patient has stated that Tylenol and toradol have been unable to alleviate pain. Dilaudid recommended to be weaned and discontinued on 7/3 due to negative effect on N/V.  - Wean dilaudid 0.25 mg q8h PRN to 0.25 mg q12h PRN. Discontinue 7/3   #Hypokalemia K today 3.2 (7/2). Suspect 2/2 poor PO intake, vomiting. -D5NS+20 mEq KCl 100 cc/h x 12h for K repletion and volume support -IV Kcl 10 mEq x 4 doses today -Trend BMP, replete electrolytes as needed   #Adnexal cyst Incidentally noted on imaging at admission, 5.2 cm left adnexal cyst.  -Recommend OP f/u  Best Practice: Diet: Soft, as tolerated IVF: Fluids: D5NS 100 cc/hr, IV Kcl 10 mEq x 4 doses today VTE: Place and maintain sequential compression device Start: 07/12/22 1502 Code: Full AB: None DISPO: Anticipated discharge pending  medical stability .  Signature: Morrie Sheldon, M.D.  Internal Medicine Resident, PGY-1 [facility]  Pager: [phone]   Please contact the on call pager after 5 pm and on weekends at [phone].

## 2022-07-16 NOTE — Progress Notes (Addendum)
PHARMACY - TOTAL PARENTERAL NUTRITION CONSULT NOTE   Indication:  intractable N/V, inability to tolerate PO/NG  Patient Measurements: Height: 5\' 2"  (157.5 cm) Weight: 62.6 kg (138 lb 1.6 oz) IBW/kg (Calculated) : 50.1 TPN AdjBW (KG): 54.5 Body mass index is 25.26 kg/m. Usual Weight: 63 kg  Assessment:  32 y.o. admitted with severe cyclic vomiting syndrome. Per chart review,  is currently not pregnant, has had several previous related admissions which were in the context of hyperemesis gravidarum. Per MD, may be associated with THC use. Attempted core track placement for duodenal trickle feeds overnight. Patient unable to tolerate (continued nausea/vomiting) and asked RN to remove core track shortly thereafter. During discussion today, [name] and states she is still attempting to eat but continues to have nausea. Rph discussed TPN process with patient and family, [name], all questions answered at this time. Patient continued on IV fluids, Phenergan, scopolamine, Reglan, and Toradol. Pharmacy consulted to initiate TPN for short-term nutrition management.   Glucose / Insulin: no hx DM, CBGs 69-118 Electrolytes: k 3.2 > repleted w/ IV KCl40 mEq Renal: Scr 0.77 (bsl <1), BUN <5 Hepatic: pending Intake / Output; MIVF: emesis/NG output 900 mL, UOP not measured; LBM 6/28 per charting   GI Imaging: 6/27 CT abdomen: no evidence of intestinal obstruction or pneumoperitoneum. There is mild diffuse wall thickening in ascending and transverse colon which may be due to incomplete distention or suggest nonspecific colitis. There is no pericolic stranding or fluid collection.  GI Surgeries / Procedures:  None  Central access: pending TPN start date: pending   RD Assessment: Estimated Needs Total Energy Estimated Needs: 1800-2000 kcals Total Protein Estimated Needs: 90-105 g Total Fluid Estimated Needs: >/= 1.8 L  Current  Nutrition:  Soft diet ordered  Plan:  TPN to start 7/03 pending PICC placement MIVF D5NS w/ KCl20 @ 75 mL/hr  Monitor TPN labs on Mon/Thurs, daily   Carrington Clamp 07/16/2022,1:36 PM

## 2022-07-17 DIAGNOSIS — E876 Hypokalemia: Secondary | ICD-10-CM | POA: Diagnosis not present

## 2022-07-17 DIAGNOSIS — R9431 Abnormal electrocardiogram [ECG] [EKG]: Secondary | ICD-10-CM | POA: Diagnosis not present

## 2022-07-17 DIAGNOSIS — D72829 Elevated white blood cell count, unspecified: Secondary | ICD-10-CM | POA: Diagnosis not present

## 2022-07-17 DIAGNOSIS — E440 Moderate protein-calorie malnutrition: Secondary | ICD-10-CM | POA: Insufficient documentation

## 2022-07-17 DIAGNOSIS — R112 Nausea with vomiting, unspecified: Secondary | ICD-10-CM | POA: Diagnosis not present

## 2022-07-17 LAB — COMPREHENSIVE METABOLIC PANEL
ALT: 13 U/L (ref 0–44)
AST: 15 U/L (ref 15–41)
Albumin: 3.1 g/dL — ABNORMAL LOW (ref 3.5–5.0)
Alkaline Phosphatase: 47 U/L (ref 38–126)
Anion gap: 9 (ref 5–15)
BUN: 7 mg/dL (ref 6–20)
CO2: 22 mmol/L (ref 22–32)
Calcium: 7.9 mg/dL — ABNORMAL LOW (ref 8.9–10.3)
Chloride: 107 mmol/L (ref 98–111)
Creatinine, Ser: 0.81 mg/dL (ref 0.44–1.00)
GFR, Estimated: >60 mL/min (ref >60)
Glucose, Bld: 375 mg/dL — ABNORMAL HIGH (ref 70–99)
Potassium: 4.3 mmol/L (ref 3.5–5.1)
Sodium: 138 mmol/L (ref 135–145)
Total Bilirubin: 0.2 mg/dL — ABNORMAL LOW (ref 0.3–1.2)
Total Protein: 5.7 g/dL — ABNORMAL LOW (ref 6.5–8.1)

## 2022-07-17 LAB — PHOSPHORUS: Phosphorus: 3.2 mg/dL (ref 2.5–4.6)

## 2022-07-17 LAB — HCG, QUANTITATIVE, PREGNANCY: hCG, Beta Chain, Quant, S: <1 m[IU]/mL (ref <5)

## 2022-07-17 LAB — GLUCOSE, CAPILLARY: Glucose-Capillary: 132 mg/dL — ABNORMAL HIGH (ref 70–99)

## 2022-07-17 MED ORDER — SODIUM CHLORIDE 0.9 % IV SOLN: INTRAVENOUS | Status: AC

## 2022-07-17 MED ORDER — DIPHENHYDRAMINE HCL 25 MG PO CAPS
25.0000 mg | ORAL_CAPSULE | Freq: Once | ORAL | Status: DC
  Filled 2022-07-17: qty 1

## 2022-07-17 MED ORDER — HYDROCORTISONE 1 % EX CREA
TOPICAL_CREAM | Freq: Two times a day (BID) | CUTANEOUS | Status: DC
  Filled 2022-07-17: qty 28

## 2022-07-17 MED ORDER — KCL IN DEXTROSE-NACL 20-5-0.9 MEQ/L-%-% IV SOLN
INTRAVENOUS | Status: AC
  Filled 2022-07-17: qty 1000

## 2022-07-17 MED ORDER — THIAMINE HCL 100 MG/ML IJ SOLN
100.0000 mg | Freq: Every day | INTRAMUSCULAR | Status: AC
  Administered 2022-07-17 – 2022-07-21 (×5): 100 mg via INTRAVENOUS
  Filled 2022-07-17 (×5): qty 2

## 2022-07-17 MED ORDER — CHLORHEXIDINE GLUCONATE CLOTH 2 % EX PADS
6.0000 | MEDICATED_PAD | Freq: Every day | CUTANEOUS | Status: DC
  Administered 2022-07-17 – 2022-07-22 (×7): 6 via TOPICAL

## 2022-07-17 MED ORDER — BOOST / RESOURCE BREEZE PO LIQD CUSTOM
1.0000 | Freq: Three times a day (TID) | ORAL | Status: DC
  Administered 2022-07-18 – 2022-07-21 (×3): 1 via ORAL

## 2022-07-17 MED ORDER — SODIUM CHLORIDE 0.9% FLUSH: 10.0000 mL | INTRAVENOUS | Status: DC | PRN

## 2022-07-17 MED ORDER — SODIUM CHLORIDE 0.9 % IV SOLN
8.0000 mg | Freq: Four times a day (QID) | INTRAVENOUS | Status: AC
  Administered 2022-07-17 – 2022-07-18 (×5): 8 mg via INTRAVENOUS
  Filled 2022-07-17 (×8): qty 4

## 2022-07-17 MED ORDER — DIPHENHYDRAMINE HCL 50 MG/ML IJ SOLN
12.5000 mg | Freq: Once | INTRAMUSCULAR | Status: AC
  Administered 2022-07-17: 12.5 mg via INTRAVENOUS
  Filled 2022-07-17: qty 1

## 2022-07-17 MED ORDER — CAMPHOR-MENTHOL 0.5-0.5 % EX LOTN: TOPICAL_LOTION | CUTANEOUS | Status: DC | PRN

## 2022-07-17 MED ORDER — TRAVASOL 10 % IV SOLN
INTRAVENOUS | Status: AC
  Filled 2022-07-17: qty 456

## 2022-07-17 MED ORDER — DEXTROSE-SODIUM CHLORIDE 5-0.9 % IV SOLN: INTRAVENOUS | Status: DC

## 2022-07-17 MED ORDER — INSULIN ASPART 100 UNIT/ML IJ SOLN
0.0000 [IU] | Freq: Four times a day (QID) | INTRAMUSCULAR | Status: DC
  Administered 2022-07-21: 1 [IU] via SUBCUTANEOUS

## 2022-07-17 MED ORDER — HYDROCORTISONE 1 % EX LOTN
TOPICAL_LOTION | Freq: Two times a day (BID) | CUTANEOUS | Status: DC
  Filled 2022-07-17 (×2): qty 118

## 2022-07-17 MED ORDER — SODIUM CHLORIDE 0.9% FLUSH
10.0000 mL | Freq: Two times a day (BID) | INTRAVENOUS | Status: DC
  Administered 2022-07-17 – 2022-07-22 (×8): 10 mL

## 2022-07-17 NOTE — Progress Notes (Signed)
SUBJECTIVE:  Patient Summary: Krista Stout is a 32 y.o. with a pertinent PMH of hyperemesis gravidarum, cyclic vomiting syndrome, who presented with intractable nausea and vomiting with abdominal pain and admitted for intractable nausea and vomiting.   Interim History: Patient was evaluated at bedside. She reports vomiting an innumerable amount of times last night similar in appearance to the previous night. She has continued to be unable to tolerate food or liquids. Generalized epigastric pain has remained persistent but unchanged. Discussed with patient that dilaudid would be discontinued.   OBJECTIVE:  Vital Signs: Vitals:   07/16/22 2109 07/17/22 0429 07/17/22 0500 07/17/22 0923  BP: 108/61 124/76  131/84  Pulse: (!) 55 (!) 52  (!) 54  Resp:    16  Temp: 98.7 F (37.1 C) 98.6 F (37 C)  98.9 F (37.2 C)  TempSrc: Oral Oral  Oral  SpO2: 100% 98%  100%  Weight:   63 kg   Height:       CBC    Component Value Date/Time   WBC 4.8 07/16/2022 0125   RBC 4.29 07/16/2022 0125   HGB 12.7 07/16/2022 0125   HCT 37.9 07/16/2022 0125   PLT 230 07/16/2022 0125   MCV 88.3 07/16/2022 0125   MCH 29.6 07/16/2022 0125   MCHC 33.5 07/16/2022 0125   RDW 13.6 07/16/2022 0125   LYMPHSABS 1.3 03/29/2022 1336   MONOABS 0.3 03/29/2022 1336   EOSABS 0.0 03/29/2022 1336   BASOSABS 0.0 03/29/2022 1336   CMP     Component Value Date/Time   NA 133 (L) 07/16/2022 0125   K 3.2 (L) 07/16/2022 0125   CL 102 07/16/2022 0125   CO2 22 07/16/2022 0125   GLUCOSE 98 07/16/2022 0125   BUN <5 (L) 07/16/2022 0125   CREATININE 0.77 07/16/2022 0125   CALCIUM 8.2 (L) 07/16/2022 0125   PROT 7.7 07/11/2022 0305   ALBUMIN 4.2 07/11/2022 0305   AST 22 07/11/2022 0305   ALT 20 07/11/2022 0305   ALKPHOS 70 07/11/2022 0305   BILITOT 0.5 07/11/2022 0305   GFRNONAA >60 07/16/2022 0125    Supplemental O2: Room Air SpO2: 100 %  Filed Weights   07/11/22 0258 07/15/22 1634 07/17/22 0500  Weight: 67.7  kg 62.6 kg 63 kg    Intake/Output Summary (Last 24 hours) at 07/17/2022 1023 Last data filed at 07/17/2022 0500 Gross per 24 hour  Intake 649.88 ml  Output 250 ml  Net 399.88 ml    Net IO Since Admission: 462.64 mL [07/17/22 1023]  Physical Exam: Physical Exam Constitutional:      Appearance: She is well-developed.  Cardiovascular:     Rate and Rhythm: Normal rate and regular rhythm.     Heart sounds: Normal heart sounds.  Pulmonary:     Effort: Pulmonary effort is normal.     Breath sounds: Normal breath sounds.  Abdominal:     Tenderness: There is generalized abdominal tenderness.  Neurological:     Mental Status: She is alert.    ASSESSMENT/PLAN:  Assessment: Active Problems:   Intractable nausea and vomiting  Plan: #Intractable nausea and vomiting #Prolonged QTc interval On 7/2, N/V similar in severity to previous night - reportedly 5-6 times. Her antiemetic regimen remains similar to previous days with zofran, phenergan, scopolamine, tigan, and reglan and is getting daily EKGs as well as on telemetry to monitor for prolonged Qtc or arrhythmia in setting of multiple Qtc prolonging agents and mild electrolyte derangements from ongoing GI losses.  Today, her Qtc is 473 ms, which decreased from 492 ms on 7/1. On 7/1, Cortrak was placed but pulled within one hour due to resultant N/V. On 7/2, because the patient has not had a full meal in 7 days, is not receiving adequate nutrition, and cannot tolerate an ND tube, TPN was discussed. Patient is agreeable to TPN placement. On 7/3, TPN is scheduled to be placed today as patient has continued to be unable to tolerate food or liquids.  -  Plan for TPN placement on 7/3 - Continue multimodal antiemetic regimen: Zofran 8 mg IV q6h, phenergan 12.5 mg suppository q6h PRN N/V, scopolamine 1.5 mg patch q72h, tigan 200 mg IM q8h PRN n/v, reglan 5 mg IV q8h PRN refractory n/v - Can get benadryl cream for itching but no IV benadryl while  receiving dilaudid - Daily EKGs, telemetry  #Pain Patient has had consistent generalized abdominal pain. She has been receiving a multimodal pain regimen of IV Tylenol 1000 mg every 6 hours, toradol 15 mg IV q6h. Patient has stated that Tylenol and toradol have been unable to alleviate pain. Dilaudid discontinued on 7/3 due to negative effect on N/V.  - Discontinued dilaudid 0.25 mg PRN  - Continue current pain regimen    #Hypokalemia Remained hypokalemic throughout hospital stay likely 2/2 poor PO intake, vomiting. -D5NS+20 mEq KCl 100 cc/h x 12h for K repletion and volume support -IV Kcl 10 mEq as needed but has required 4 doses per day throughout stay  -Trend CMP, replete electrolytes as needed   #Adnexal cyst Incidentally noted on imaging at admission, 5.2 cm left adnexal cyst.  -Recommend OP f/u  Best Practice: Diet: Liquid diet as tolerated/TPN IVF: Fluids: D5NS 100 cc/hr, IV Kcl 10 mEq x 4 doses today VTE: enoxaparin (LOVENOX) injection 40 mg Start: 07/16/22 0915 Place and maintain sequential compression device Start: 07/12/22 1502 Code: Full AB: None DISPO: Anticipated discharge pending  medical stability .  Signature: Morrie Sheldon, M.D.  Internal Medicine Resident, PGY-1 [facility]  Pager: [phone]  Please contact the on call pager after 5 pm and on weekends at [phone].

## 2022-07-17 NOTE — Progress Notes (Signed)
PHARMACY - TOTAL PARENTERAL NUTRITION CONSULT NOTE   Indication:   intractable N/V, inability to tolerate PO/NG  Patient Measurements: Height: 5\' 2"  (157.5 cm) Weight: 63 kg (138 lb 14.2 oz) IBW/kg (Calculated) : 50.1 TPN AdjBW (KG): 54.5 Body mass index is 25.4 kg/m. Usual Weight: 63 kg  Assessment:  32 y.o. admitted with severe cyclic vomiting syndrome. Per chart review,  is currently not pregnant, has had several previous related admissions which were in the context of hyperemesis gravidarum. Per MD, may be associated with THC use. Attempted core track placement for duodenal trickle feeds overnight. Patient unable to tolerate (continued nausea/vomiting) and asked RN to remove core track shortly thereafter. During discussion today, [name] and states she is still attempting to eat but continues to have nausea. Rph discussed TPN process with patient and family, [name], all questions answered at this time. Patient continued on IV fluids, Phenergan, scopolamine, Reglan, and Toradol. Pharmacy consulted to initiate TPN for short-term nutrition management.   Glucose / Insulin:  no hx DM, CBGs 69-118  Electrolytes: k 3.2, Na 133, phos 3.2, Mg 1.9, others wnl Renal: Scr 0.77, BUN <5 Hepatic: AST/ALT wnl, tbili 0.5, alb 4.2 Intake / Output; MIVF: UOP not measured, emesis down 450 mL, LBM charted 6/28  GI Imaging: 6/27 CT abdomen: no evidence of intestinal obstruction or pneumoperitoneum. There is mild diffuse wall thickening in ascending and transverse colon which may be due to incomplete distention or suggest nonspecific colitis. There is no pericolic stranding or fluid collection. GI Surgeries / Procedures:  None  Central access: pending  TPN start date: pending   Nutritional Goals: Goal TPN rate is 76 mL/hr (provides 91 g of protein and 1780 kcals per day)  RD Assessment: Estimated Needs Total Energy Estimated  Needs: 1800-2000 kcals Total Protein Estimated Needs: 90-105 g Total Fluid Estimated Needs: >/= 1.8 L  Current Nutrition:  Soft diet  Plan:  Start TPN at 28mL/hr at 1800 (to provide 50% daily nutritional needs) Electrolytes in TPN: Na 82mEq/L, K 13mEq/L, Ca 75mEq/L, Mg 83mEq/L, and Phos 73mmol/L. Cl:Ac 1:1 Add standard MVI and trace elements to TPN Initiate very Sensitive q6h SSI at [facility] and adjust as needed  Reduce MIVF to 10 mL/hr at 1800 Monitor TPN labs on Mon/Thurs, daily as needed  Carrington Clamp 07/17/2022,8:27 AM

## 2022-07-17 NOTE — Plan of Care (Signed)

## 2022-07-17 NOTE — Progress Notes (Signed)
Nutrition Follow-up  DOCUMENTATION CODES:   Non-severe (moderate) malnutrition in context of acute illness/injury  INTERVENTION:   TPN to meet nutritional needs -Add IV thiamine 100 mg x 5 days given pt is at risk for refeeding due to inability to tolerate anything for >/= 1 week, high GI losses with vomiting -Noted estimated TPN meets <100% of estimated needs, recommend attempting to meet nutritional needs completely as no fluid volume constraints  Discussed IV fluids with IM team and plan to start NS at 40 ml/hr once TPN starts tonight at 1800 at 38 ml/hr. Continuing D5-NS with KCl until that time  Encouraged pt to trial sips of liquids when asymptomatic; goal initially is for diet tolerance and then will work towards adequacy of po  Change oral nutrition supplement to Boost Breeze po TID, each supplement provides 250 kcal and 9 grams of protein. Pt prefers these to milky supplements for now  NUTRITION DIAGNOSIS:   Moderate Malnutrition related to acute illness (intractable N/V) as evidenced by percent weight loss, energy intake < or equal to 50% for > or equal to 5 days.  GOAL:   Patient will meet greater than or equal to 90% of their needs   MONITOR:   PO intake, Supplement acceptance, Labs, Weight trends  REASON FOR ASSESSMENT:   Consult, Malnutrition Screening Tool Enteral/tube feeding initiation and management  ASSESSMENT:   32 yo female admitted with severe cyclic vomiting syndrome. Noted pt with hx of hyperemesis gravidarum and cyclic vomiting in the past, even with PEG tube placement several years ago. UDS + for THC.  6/25 Inability to tolerate po with intractable N/V started per pt report 6/28 Admitted  7/01 Cortrak placed but removed shortly after as pt did not tolerate, worsening nausea/vomiting 7/03 PICC line placed, TPN starting  Diet changed to CL this AM. Pt continues to report that she is unable to tolerate anything by mouth including ice. Inability to  tolerate any po x 1 week  Noted PICC line placed this AM and TPN to start this evening  Pt continues to report N/V with no improvement, pt also indicates continued abdominal pain. Pt reports she is not constipated, in fact she reports 3 liquid stools today.  Pt reports she is still making urine through out the day but has noted urine has darkened in color. D5-NS with KCl currently at 75 ml/hr, noted plan to decrease to 10 ml/hr this evening once TPN starts (only starting at 38 ml/hr). Recommend continuing some MIVF to meet hydration needs given pt's inability to tolerate po and increased GI losses from vomiting. Suggest at least providing total of 75 ml/hr between TPN and MIVF. Plan to discuss with MD and Pharmacist  Noted new weight on 7/01 of 62.6 kg, wt today 63 kg. Admit wt 67.7 kg. If pt's reported UBW of 150 pounds is accurate then pt has experienced weight loss. 7% wt loss  Labs: phosphorus 3.2 (wdl), sodium 133 (L), potassium 3.2 (L), Creatinine wdl, BUN <5 Meds: reviewed  Diet Order:   Diet Order             Diet clear liquid Room service appropriate? Yes; Fluid consistency: Thin  Diet effective now                   EDUCATION NEEDS:   Education needs have been addressed  Skin:  Skin Assessment: Reviewed RN Assessment  Last BM:  7/03 3 liquid stools today per pt report  Height:   Ht  Readings from Last 1 Encounters:  07/11/22 5\' 2"  (1.575 m)    Weight:   Wt Readings from Last 1 Encounters:  07/17/22 63 kg     BMI:  Body mass index is 25.4 kg/m.  Estimated Nutritional Needs:   Kcal:  1800-2000 kcals  Protein:  90-105 g  Fluid:  >/= 1.8 L   Romelle Starcher MS, RDN, LDN, CNSC Registered Dietitian 3 Clinical Nutrition RD Pager and On-Call Pager Number Located in [place]

## 2022-07-17 NOTE — Progress Notes (Signed)
[facility] ECG printed and placed on physical chart.

## 2022-07-17 NOTE — Progress Notes (Signed)
Peripherally Inserted Central Catheter Placement  The IV Nurse has discussed with the patient and/or persons authorized to consent for the patient, the purpose of this procedure and the potential benefits and risks involved with this procedure.  The benefits include less needle sticks, lab draws from the catheter, and the patient may be discharged home with the catheter. Risks include, but not limited to, infection, bleeding, blood clot (thrombus formation), and puncture of an artery; nerve damage and irregular heartbeat and possibility to perform a PICC exchange if needed/ordered by physician.  Alternatives to this procedure were also discussed.  Bard Power PICC patient education guide, fact sheet on infection prevention and patient information card has been provided to patient /or left at bedside.    PICC Placement Documentation  PICC Double Lumen 07/17/22 Right Basilic 37 cm 2 cm (Active)  Indication for Insertion or Continuance of Line Administration of hyperosmolar/irritating solutions (i.e. TPN, Vancomycin, etc.) 07/17/22 1053  Exposed Catheter (cm) 0 cm 07/17/22 1053  Site Assessment Clean, Dry, Intact 07/17/22 1053  Lumen #1 Status Flushed;Saline locked;Blood return noted 07/17/22 1053  Lumen #2 Status Flushed;Saline locked;Blood return noted 07/17/22 1053  Dressing Type Transparent;Securing device 07/17/22 1053  Dressing Status Antimicrobial disc in place 07/17/22 1053  Safety Lock Not Applicable 07/17/22 1053  Line Care Connections checked and tightened 07/17/22 1053  Line Adjustment (NICU/IV Team Only) No 07/17/22 1053  Dressing Intervention New dressing 07/17/22 1053  Dressing Change Due 07/24/22 07/17/22 1053       Krista Stout 07/17/2022, 10:57 AM

## 2022-07-17 NOTE — Progress Notes (Signed)
Paged regarding swelling and itching in right arm. IV infiltrate yesterday. Patient endorses itching in forearm that has moderately improved after hydrocortisone cream. Left forearm more swollen than the right but could not appreciate erythema. Radial pulse palpable in the right arm. PO Benadryl ordered.

## 2022-07-18 DIAGNOSIS — R112 Nausea with vomiting, unspecified: Secondary | ICD-10-CM | POA: Diagnosis not present

## 2022-07-18 DIAGNOSIS — D72829 Elevated white blood cell count, unspecified: Secondary | ICD-10-CM | POA: Diagnosis not present

## 2022-07-18 DIAGNOSIS — R9431 Abnormal electrocardiogram [ECG] [EKG]: Secondary | ICD-10-CM | POA: Diagnosis not present

## 2022-07-18 DIAGNOSIS — E876 Hypokalemia: Secondary | ICD-10-CM | POA: Diagnosis not present

## 2022-07-18 LAB — MAGNESIUM: Magnesium: 1.9 mg/dL (ref 1.7–2.4)

## 2022-07-18 LAB — COMPREHENSIVE METABOLIC PANEL
ALT: 15 U/L (ref 0–44)
AST: 15 U/L (ref 15–41)
Albumin: 3.5 g/dL (ref 3.5–5.0)
Alkaline Phosphatase: 52 U/L (ref 38–126)
Anion gap: 9 (ref 5–15)
BUN: 6 mg/dL (ref 6–20)
CO2: 23 mmol/L (ref 22–32)
Calcium: 8.6 mg/dL — ABNORMAL LOW (ref 8.9–10.3)
Chloride: 105 mmol/L (ref 98–111)
Creatinine, Ser: 0.71 mg/dL (ref 0.44–1.00)
GFR, Estimated: >60 mL/min (ref >60)
Glucose, Bld: 129 mg/dL — ABNORMAL HIGH (ref 70–99)
Potassium: 3.1 mmol/L — ABNORMAL LOW (ref 3.5–5.1)
Sodium: 137 mmol/L (ref 135–145)
Total Bilirubin: 0.5 mg/dL (ref 0.3–1.2)
Total Protein: 6.5 g/dL (ref 6.5–8.1)

## 2022-07-18 LAB — PHOSPHORUS: Phosphorus: 3.3 mg/dL (ref 2.5–4.6)

## 2022-07-18 LAB — GLUCOSE, CAPILLARY
Glucose-Capillary: 124 mg/dL — ABNORMAL HIGH (ref 70–99)
Glucose-Capillary: 139 mg/dL — ABNORMAL HIGH (ref 70–99)
Glucose-Capillary: 140 mg/dL — ABNORMAL HIGH (ref 70–99)

## 2022-07-18 MED ORDER — KETOROLAC TROMETHAMINE 15 MG/ML IJ SOLN
INTRAMUSCULAR | Status: AC
  Administered 2022-07-18: 15 mg
  Filled 2022-07-18: qty 1

## 2022-07-18 MED ORDER — SODIUM CHLORIDE 0.9 % IV SOLN: INTRAVENOUS | Status: DC

## 2022-07-18 MED ORDER — POTASSIUM CHLORIDE 10 MEQ/100ML IV SOLN
10.0000 meq | INTRAVENOUS | Status: AC
  Administered 2022-07-18 (×4): 10 meq via INTRAVENOUS
  Filled 2022-07-18 (×4): qty 100

## 2022-07-18 MED ORDER — SODIUM CHLORIDE 0.9 % IV SOLN
8.0000 mg | Freq: Four times a day (QID) | INTRAVENOUS | Status: DC
  Administered 2022-07-19 – 2022-07-20 (×6): 8 mg via INTRAVENOUS
  Filled 2022-07-18 (×10): qty 4

## 2022-07-18 MED ORDER — TRAVASOL 10 % IV SOLN
INTRAVENOUS | Status: AC
  Filled 2022-07-18: qty 912

## 2022-07-18 MED ORDER — KETOROLAC TROMETHAMINE 15 MG/ML IJ SOLN: 15.0000 mg | Freq: Four times a day (QID) | INTRAMUSCULAR | Status: DC

## 2022-07-18 MED ORDER — ONDANSETRON 8 MG/NS 50 ML IVPB
8.0000 mg | Freq: Four times a day (QID) | INTRAVENOUS | Status: DC
  Administered 2022-07-18: 8 mg via INTRAVENOUS
  Filled 2022-07-18 (×3): qty 54

## 2022-07-18 NOTE — Progress Notes (Signed)
SUBJECTIVE:  Patient Summary: Krista Stout is a 32 y.o. with a pertinent PMH of hyperemesis gravidarum, cyclic vomiting syndrome, who presented with intractable nausea and vomiting with abdominal pain and admitted for intractable nausea and vomiting.   Interim History: Patient was evaluated at bedside. Patient reports no vomiting overnight and feels much better today. She has been up and walking. She was encouraged to start drinking more fluids.   OBJECTIVE:  Vital Signs: Vitals:   07/17/22 0923 07/17/22 2020 07/18/22 0558 07/18/22 0813  BP: 131/84 123/74 (!) 142/89 124/76  Pulse: (!) 54 (!) 57 60 69  Resp: 16 17 17 17   Temp: 98.9 F (37.2 C) 99.2 F (37.3 C) 98.1 F (36.7 C) 98.6 F (37 C)  TempSrc: Oral Oral Oral Oral  SpO2: 100% 100% 100% 100%  Weight:   60.9 kg   Height:       CBC    Component Value Date/Time   WBC 4.8 07/16/2022 0125   RBC 4.29 07/16/2022 0125   HGB 12.7 07/16/2022 0125   HCT 37.9 07/16/2022 0125   PLT 230 07/16/2022 0125   MCV 88.3 07/16/2022 0125   MCH 29.6 07/16/2022 0125   MCHC 33.5 07/16/2022 0125   RDW 13.6 07/16/2022 0125   LYMPHSABS 1.3 03/29/2022 1336   MONOABS 0.3 03/29/2022 1336   EOSABS 0.0 03/29/2022 1336   BASOSABS 0.0 03/29/2022 1336   CMP     Component Value Date/Time   NA 137 07/18/2022 0327   K 3.1 (L) 07/18/2022 0327   CL 105 07/18/2022 0327   CO2 23 07/18/2022 0327   GLUCOSE 129 (H) 07/18/2022 0327   BUN 6 07/18/2022 0327   CREATININE 0.71 07/18/2022 0327   CALCIUM 8.6 (L) 07/18/2022 0327   PROT 6.5 07/18/2022 0327   ALBUMIN 3.5 07/18/2022 0327   AST 15 07/18/2022 0327   ALT 15 07/18/2022 0327   ALKPHOS 52 07/18/2022 0327   BILITOT 0.5 07/18/2022 0327   GFRNONAA >60 07/18/2022 0327    Supplemental O2: Room Air SpO2: 100 %  Filed Weights   07/15/22 1634 07/17/22 0500 07/18/22 0558  Weight: 62.6 kg 63 kg 60.9 kg    Intake/Output Summary (Last 24 hours) at 07/18/2022 1302 Last data filed at 07/17/2022  1858 Gross per 24 hour  Intake 1193.75 ml  Output --  Net 1193.75 ml    Net IO Since Admission: 1,906.39 mL [07/18/22 1302]  Physical Exam: Physical Exam Constitutional:      Appearance: She is well-developed.  Cardiovascular:     Rate and Rhythm: Normal rate and regular rhythm.     Heart sounds: Normal heart sounds.  Pulmonary:     Effort: Pulmonary effort is normal.     Breath sounds: Normal breath sounds.  Abdominal:     General: Abdomen is flat.     Palpations: Abdomen is soft.  Neurological:     Mental Status: She is alert.    ASSESSMENT/PLAN:  Assessment: Active Problems:   Intractable nausea and vomiting   Malnutrition of moderate degree  Plan: #Intractable nausea and vomiting #Prolonged QTc interval On 7/2, N/V similar in severity to previous night - reportedly 5-6 times. Her antiemetic regimen remains similar to previous days with zofran, phenergan, scopolamine, tigan, and reglan and is getting daily EKGs as well as on telemetry to monitor for prolonged Qtc or arrhythmia in setting of multiple Qtc prolonging agents and mild electrolyte derangements from ongoing GI losses. Today, her Qtc is 473 ms, which  decreased from 492 ms on 7/1. On 7/1, Cortrak was placed but pulled within one hour due to resultant N/V. On 7/2, because the patient has not had a full meal in 7 days, is not receiving adequate nutrition, and cannot tolerate an ND tube, TPN was discussed. On 7/3, TPN was administered and patient has been tolerating well as of 7/4 - Continue TPN and encourage liquid diet as tolerated  - Continue multimodal antiemetic regimen: Zofran 8 mg IV q6h, phenergan 12.5 mg suppository q6h PRN N/V, scopolamine 1.5 mg patch q72h, tigan 200 mg IM q8h PRN n/v, reglan 5 mg IV q8h PRN refractory n/v - Can get benadryl cream for itching  - Daily EKGs, telemetry  #Pain Patient has had consistent generalized pain described as 10/10 that significantly improved on 7/4. She had been  receiving a multimodal pain regimen of IV Tylenol 1000 mg every 6 hours, toradol 15 mg IV q6h, dilaudid 0.25 mg decreased to q12h PRN from q8h PRN. Dilaudid discontinued on 7/3 and Toradol discontinued on 7/4. On 7/3, patient stated she had arm pain, swelling, and itching associated with previous IV treatment that was treated with hydrocortisone cream and benadryl.   #Hypokalemia K today 3.1 (7/4). Suspect 2/2 poor PO intake, vomiting. -D5NS+20 mEq KCl 100 cc/h x 12h for K repletion and volume support -IV Kcl 10 mEq x 4 doses today -Trend BMP, replete electrolytes as needed   #Adnexal cyst Incidentally noted on imaging at admission, 5.2 cm left adnexal cyst.  -Recommend OP f/u  Best Practice: Diet: Soft, as tolerated IVF: Fluids: D5NS 100 cc/hr, IV Kcl 10 mEq x 4 doses today VTE: enoxaparin (LOVENOX) injection 40 mg Start: 07/16/22 0915 Place and maintain sequential compression device Start: 07/12/22 1502 Code: Full AB: None DISPO: Anticipated discharge pending  medical stability .  Signature: Morrie Sheldon, M.D.  Internal Medicine Resident, PGY-1 [facility]  Pager: [phone]   Please contact the on call pager after 5 pm and on weekends at [phone].

## 2022-07-18 NOTE — TOC CAGE-AID Note (Signed)
Transition of Care [facility]) - CAGE-AID Screening   Patient Details  Name: Krista Stout MRN: [id number] Date of Birth: 11/09/1990  [facility] of [facility]) CM/SW Contact:    Lorri Frederick, LCSW Phone Number: 07/18/2022, 11:31 AM   Clinical Narrative:  CSW met with pt for cage aid.  Pt reports smoking CBD products from area CBD stores.  Reports use is twice per week.  Denies other drugs.  Reports alcohol use once per month. Cage aid score is 0. Pt reports going one year without CBD use and still having "the worst" episode of vomiting she ever had.  Pt is not interested in treatment at this time.    CAGE-AID Screening:    Have You Ever Felt You Ought to Cut Down on Your Drinking or Drug Use?: No Have People Annoyed You By Critizing Your Drinking Or Drug Use?: No Have You Felt Bad Or Guilty About Your Drinking Or Drug Use?: No Have You Ever Had a Drink or Used Drugs First Thing In The Morning to Steady Your Nerves or to Get Rid of a Hangover?: No CAGE-AID Score: 0  Substance Abuse Education Offered: Yes  Substance abuse interventions: Patient Counseling

## 2022-07-18 NOTE — Progress Notes (Signed)
PHARMACY - TOTAL PARENTERAL NUTRITION CONSULT NOTE   Indication:   intractable N/V, inability to tolerate PO/NG  Patient Measurements: Height: 5\' 2"  (157.5 cm) Weight: 60.9 kg (134 lb 4.2 oz) IBW/kg (Calculated) : 50.1 TPN AdjBW (KG): 54.5 Body mass index is 24.56 kg/m. Usual Weight: 63 kg  Assessment:  32 y.o. admitted with severe cyclic vomiting syndrome. Per chart review,  is currently not pregnant, has had several previous related admissions which were in the context of hyperemesis gravidarum. Per MD, may be associated with THC use. Attempted core track placement for duodenal trickle feeds overnight. Patient unable to tolerate (continued nausea/vomiting) and asked RN to remove core track shortly thereafter. During discussion today, [name] and states she is still attempting to eat but continues to have nausea. Rph discussed TPN process with patient and family, [name], all questions answered at this time. Patient continued on IV fluids, Phenergan, scopolamine, Reglan, and Toradol. Pharmacy consulted to initiate TPN for short-term nutrition management.   Glucose / Insulin:  no hx DM, CBGs 69-118  Electrolytes: k 3.1, phos 3.3, Mg 1.9, others wnl Renal: Scr 0.71, BUN 6 Hepatic: AST/ALT wnl, tbili 0.5, alb 4.2 Intake / Output; MIVF: UOP not measured, emesis down 450 mL, LBM charted 6/28  GI Imaging: 6/27 CT abdomen: no evidence of intestinal obstruction or pneumoperitoneum. There is mild diffuse wall thickening in ascending and transverse colon which may be due to incomplete distention or suggest nonspecific colitis. There is no pericolic stranding or fluid collection. GI Surgeries / Procedures:  None  Central access: pending  TPN start date: pending   Nutritional Goals: Goal TPN rate is 76 mL/hr (provides 91 g of protein and 1780 kcals per day)  RD Assessment: Estimated Needs Total Energy Estimated Needs:  1800-2000 kcals Total Protein Estimated Needs: 90-105 g Total Fluid Estimated Needs: >/= 1.8 L  Current Nutrition:  Soft diet  Plan: Increase TPN to 76 mL/hr at 1800 (to provide 100% daily nutritional needs)  Electrolytes in TPN: Na 76mEq/L, K 44mEq/L, Ca 39mEq/L, Mg 79mEq/L, and Phos 81mmol/L. Cl:Ac 1:1 Add standard MVI and trace elements to TPN Initiate very Sensitive q6h SSI at [facility] and adjust as needed  Reduce MIVF to 10 mL/hr at 1800 Monitor TPN labs on Mon/Thurs, daily as needed  KCl IV 40 mEq x4 outside of TPN  Thank you for allowing pharmacy to be a part of this patient's care.  Thelma Barge, PharmD Clinical Pharmacist

## 2022-07-18 NOTE — Plan of Care (Signed)
  Problem: Education: Goal: Knowledge of General Education information will improve Description: Including pain rating scale, medication(s)/side effects and non-pharmacologic comfort measures Outcome: Progressing   Problem: Health Behavior/Discharge Planning: Goal: Ability to manage health-related needs will improve Outcome: Progressing   Problem: Clinical Measurements: Goal: Respiratory complications will improve Outcome: Progressing   

## 2022-07-19 DIAGNOSIS — E876 Hypokalemia: Secondary | ICD-10-CM | POA: Diagnosis not present

## 2022-07-19 DIAGNOSIS — G43A0 Cyclical vomiting, not intractable: Secondary | ICD-10-CM | POA: Diagnosis not present

## 2022-07-19 LAB — COMPREHENSIVE METABOLIC PANEL
ALT: 17 U/L (ref 0–44)
AST: 14 U/L — ABNORMAL LOW (ref 15–41)
Albumin: 3.8 g/dL (ref 3.5–5.0)
Alkaline Phosphatase: 65 U/L (ref 38–126)
Anion gap: 9 (ref 5–15)
BUN: 6 mg/dL (ref 6–20)
CO2: 25 mmol/L (ref 22–32)
Calcium: 8.9 mg/dL (ref 8.9–10.3)
Chloride: 102 mmol/L (ref 98–111)
Creatinine, Ser: 0.75 mg/dL (ref 0.44–1.00)
GFR, Estimated: >60 mL/min (ref >60)
Glucose, Bld: 134 mg/dL — ABNORMAL HIGH (ref 70–99)
Potassium: 3.4 mmol/L — ABNORMAL LOW (ref 3.5–5.1)
Sodium: 136 mmol/L (ref 135–145)
Total Bilirubin: 0.3 mg/dL (ref 0.3–1.2)
Total Protein: 7.3 g/dL (ref 6.5–8.1)

## 2022-07-19 LAB — GLUCOSE, CAPILLARY
Glucose-Capillary: 120 mg/dL — ABNORMAL HIGH (ref 70–99)
Glucose-Capillary: 127 mg/dL — ABNORMAL HIGH (ref 70–99)
Glucose-Capillary: 138 mg/dL — ABNORMAL HIGH (ref 70–99)
Glucose-Capillary: 155 mg/dL — ABNORMAL HIGH (ref 70–99)

## 2022-07-19 LAB — PHOSPHORUS: Phosphorus: 3.9 mg/dL (ref 2.5–4.6)

## 2022-07-19 LAB — MAGNESIUM: Magnesium: 1.8 mg/dL (ref 1.7–2.4)

## 2022-07-19 MED ORDER — POTASSIUM CHLORIDE 10 MEQ/100ML IV SOLN
10.0000 meq | INTRAVENOUS | Status: AC
  Administered 2022-07-19 (×3): 10 meq via INTRAVENOUS
  Filled 2022-07-19 (×3): qty 100

## 2022-07-19 MED ORDER — TRAVASOL 10 % IV SOLN
INTRAVENOUS | Status: AC
  Filled 2022-07-19: qty 936

## 2022-07-19 NOTE — Progress Notes (Signed)
   HD#7 SUBJECTIVE:  Patient Summary: Krista Stout is a 32 y.o. with a pertinent PMH of cyclic vomiting syndrome, who presented with n/v and admitted for cyclic vomiting syndrome.   Overnight Events: No acute events overnight  Interim History: The patient was evaluated at the bedside this morning. She notes that she has more energy than the previous days, but that she vomited many times during the night. She did tolerate some liquids yesterday, but has not attempted any today.   OBJECTIVE:  Vital Signs: Vitals:   07/18/22 2041 07/19/22 0358 07/19/22 0408 07/19/22 0907  BP: (!) 142/89 (!) 135/95  105/82  Pulse: (!) 56 74  84  Resp: 18 19  18   Temp: 98.8 F (37.1 C) 98.9 F (37.2 C)  98.3 F (36.8 C)  TempSrc: Oral Oral  Oral  SpO2: 100% 97%  100%  Weight:   60.4 kg   Height:       Supplemental O2: Room Air SpO2: 100 %  Filed Weights   07/17/22 0500 07/18/22 0558 07/19/22 0408  Weight: 63 kg 60.9 kg 60.4 kg     Intake/Output Summary (Last 24 hours) at 07/19/2022 1149 Last data filed at 07/19/2022 0600 Gross per 24 hour  Intake 1160.78 ml  Output --  Net 1160.78 ml   Net IO Since Admission: 3,067.17 mL [07/19/22 1149]  Physical Exam: General: Appears comfortable Pulmonary: Normal work of breathing Abdominal: Soft, nontender, nondistended. Extremities: Palpable radial pulses. No edema of RUE Skin: Warm and dry.  Neuro: No focal deficit. Psych: Normal mood and affect     ASSESSMENT/PLAN:  Assessment: Active Problems:   Intractable nausea and vomiting   Malnutrition of moderate degree   Plan: Cyclic vomiting syndrome The patient was started on TPN on 7/3 and reports an improvement in her symptoms - she was able to tolerate some liquids yesterday and did not have any episodes of emesis throughout the day. While she did have emesis during the night, she feels like her energy level is overall improved today. Will continue with clear liquids today, hope to  advance diet in the next day or two.  - Continue TPN + clear liquid diet today - Antiemetic regimen: Zofran 8 mg IV q6h, phenergan 12.5 mg suppository q6h PRN N/V, scopolamine 1.5 mg patch q72h, tigan 200 mg IM q8h PRN n/v, reglan 5 mg IV q8h PRN refractory n/v  - Daily EKG for QT monitoring (today was 459 ms)  Generalized abdominal pain Pain is likely secondary to severe retching, but it is improved today. She is no longer requiring opioids.  Hypokalemia K today 3.4 and replenished with IV Kcl 10 mEq x 4 doses today 3 doses.  Continue to trend BMP and replete electrolytes as necessary.  Best Practice: Diet: Clear liquid diet, advance as tolerated; currently on TPN IVF: Fluids: 0.9NS, Rate:  10  mL/hr continuous VTE: enoxaparin (LOVENOX) injection 40 mg Start: 07/16/22 0915 Place and maintain sequential compression device Start: 07/12/22 1502 Code: Full DISPO: Anticipated discharge in 2 days to Home pending  diet tolerance .  Signature: Morrie Sheldon, MD Internal Medicine Resident, PGY-1 [facility]  Pager: [phone] 11:49 AM, 07/19/2022   Please contact the on call pager after 5 pm and on weekends at [phone].

## 2022-07-19 NOTE — Progress Notes (Signed)
Nutrition Follow-up  DOCUMENTATION CODES:   Non-severe (moderate) malnutrition in context of acute illness/injury  INTERVENTION:   Continue TPN at goal rate to meet 100% nutritional needs -Recommend continuing TPN until pt able to tolerate soft diet and able to eat more than just bites  Boost Breeze po TID, each supplement provides 250 kcal and 9 grams of protein  Encouraged pt to take sips of liquids when asymptomatic; goal initially is for diet tolerance and then will work towards adequacy of po   IV thiamine started 7/3 for total of 5 days  NUTRITION DIAGNOSIS:   Moderate Malnutrition related to acute illness (intractable N/V) as evidenced by percent weight loss, energy intake < or equal to 50% for > or equal to 5 days.  Being addressed via TPN, diet as tolerated  GOAL:   Patient will meet greater than or equal to 90% of their needs  Met via nutrition support  MONITOR:   PO intake, Supplement acceptance, Labs, Weight trends  REASON FOR ASSESSMENT:   Consult, Malnutrition Screening Tool Enteral/tube feeding initiation and management  ASSESSMENT:   32 yo female admitted with severe cyclic vomiting syndrome. Noted pt with hx of hyperemesis gravidarum and cyclic vomiting in the past, even with PEG tube placement several years ago. UDS + for THC.  6/25 Inability to tolerate po with intractable N/V started per pt report 6/28 Admitted  7/01 Cortrak placed but removed shortly after as pt did not tolerate, worsening nausea/vomiting 7/03 PICC line placed, TPN starting 7/04 TPN increased to goal   TPN rate increased to goal on 7/04, TPN modified today with goal of 75 ml/hr providing 1832 kcals, 94 g of protein  Pt without vomiting the evening of July 3rd into July 4th, able to tolerate some CL. However, last night pt with multiple episodes of emesis.   Pt likes the Boost Breeze, taking sips at times but unable to tolerate significant amount at this time.   Labs:  potassium 3.4 (L), BUN/Creatinine wdl, phosphorus 3.6 (wdl), magnesium 1.8 (wdl), sodium 136 (wdl), CBGs 127-155 Meds: ss novolog, KCl, IV thiamine   Diet Order:   Diet Order             Diet full liquid Room service appropriate? Yes; Fluid consistency: Thin  Diet effective now                   EDUCATION NEEDS:   Education needs have been addressed  Skin:  Skin Assessment: Reviewed RN Assessment  Last BM:  7/03 3 liquid stools today per pt report  Height:   Ht Readings from Last 1 Encounters:  07/11/22 5\' 2"  (1.575 m)    Weight:   Wt Readings from Last 1 Encounters:  07/19/22 60.4 kg    BMI:  Body mass index is 24.35 kg/m.  Estimated Nutritional Needs:   Kcal:  1800-2000 kcals  Protein:  90-105 g  Fluid:  >/= 1.8 L   Romelle Starcher MS, RDN, LDN, CNSC Registered Dietitian 3 Clinical Nutrition RD Pager and On-Call Pager Number Located in [place]

## 2022-07-19 NOTE — Progress Notes (Signed)
Pt drank orange juice and vomited ~400 ml. Ran ondansetron. No other issues

## 2022-07-19 NOTE — Progress Notes (Signed)
PHARMACY - TOTAL PARENTERAL NUTRITION CONSULT NOTE  Indication:   intractable N/V, inability to tolerate PO/NG  Patient Measurements: Height: 5\' 2"  (157.5 cm) Weight: 60.4 kg (133 lb 2.5 oz) IBW/kg (Calculated) : 50.1 TPN AdjBW (KG): 54.5 Body mass index is 24.35 kg/m. Usual Weight: 63 kg  Assessment:  32 y.o. admitted with severe cyclic vomiting syndrome. Per chart review,  is currently not pregnant, has had several previous related admissions which were in the context of hyperemesis gravidarum. Per MD, may be associated with THC use. Attempted core track placement for duodenal trickle feeds overnight. Patient unable to tolerate (continued nausea/vomiting) and asked RN to remove core track shortly thereafter. During discussion today, [name] and states she is still attempting to eat but continues to have nausea. Rph discussed TPN process with patient and family, [name], all questions answered at this time. Patient continued on IV fluids, Phenergan, scopolamine, Reglan, and Toradol. Pharmacy consulted to dose TPN for short-term nutrition management.   Glucose / Insulin:  no hx DM - CBGs < 180.  No SSI use in the past 24 hrs Electrolytes: K 3.4 post 4 runs, others WNL (Phos trending up) Renal: SCr < 1, BUN WNL Hepatic: LFTs / tbili WNL, albumin WNL at 3.8 Intake / Output; MIVF: UOP not measured, emesis x1 on 7/4, LBM charted 6/28 GI Imaging: 6/27 CT: no evidence of intestinal obstruction or pneumoperitoneum. Possible nonspecific colitis.  GI Surgeries / Procedures: none since TPN initiation  Central access: 07/17/22 TPN start date: 07/17/22  Nutritional Goals:  RD Estimated Needs Total Energy Estimated Needs: 1800-2000 kcals Total Protein Estimated Needs: 90-105 g Total Fluid Estimated Needs: >/= 1.8 L  Current Nutrition:  TPN FLD - less vomiting, still nauseous, ate ~2% of diet Resource Breeze TID - 2 charted  given yesterday but only drank a few sips  Plan: TPN at 75 ml/hr will provide 94g and 1832 kCal, meeting 100% of needs Electrolytes in TPN: Na 35mEq/L, K 87mEq/L, Ca 65mEq/L, Mg 27mEq/L, Phos 5mmol/L. Cl:Ac 1:1 - tweak lytes  Add standard MVI and trace elements to TPN Continue sensitive SSI Q6H for now Thiamine 100mg  IV daily x 5 days per MD/RD KCL x 3 runs per MD Monitor TPN labs on Mon/Thurs - BMET in AM F/U PO intake/diet advancement to begin weaning TPN  Krista Stout D. Laney Potash, PharmD, BCPS, BCCCP 07/19/2022, 11:03 AM

## 2022-07-20 DIAGNOSIS — E876 Hypokalemia: Secondary | ICD-10-CM | POA: Diagnosis not present

## 2022-07-20 DIAGNOSIS — G43A0 Cyclical vomiting, not intractable: Secondary | ICD-10-CM | POA: Diagnosis not present

## 2022-07-20 LAB — BASIC METABOLIC PANEL
Anion gap: 9 (ref 5–15)
BUN: 10 mg/dL (ref 6–20)
CO2: 26 mmol/L (ref 22–32)
Calcium: 9.2 mg/dL (ref 8.9–10.3)
Chloride: 100 mmol/L (ref 98–111)
Creatinine, Ser: 0.76 mg/dL (ref 0.44–1.00)
GFR, Estimated: >60 mL/min (ref >60)
Glucose, Bld: 129 mg/dL — ABNORMAL HIGH (ref 70–99)
Potassium: 3.6 mmol/L (ref 3.5–5.1)
Sodium: 135 mmol/L (ref 135–145)

## 2022-07-20 LAB — GLUCOSE, CAPILLARY
Glucose-Capillary: 116 mg/dL — ABNORMAL HIGH (ref 70–99)
Glucose-Capillary: 124 mg/dL — ABNORMAL HIGH (ref 70–99)
Glucose-Capillary: 132 mg/dL — ABNORMAL HIGH (ref 70–99)
Glucose-Capillary: 137 mg/dL — ABNORMAL HIGH (ref 70–99)

## 2022-07-20 MED ORDER — ONDANSETRON 8 MG/NS 50 ML IVPB
8.0000 mg | Freq: Four times a day (QID) | INTRAVENOUS | Status: AC
  Administered 2022-07-20 – 2022-07-21 (×3): 8 mg via INTRAVENOUS
  Filled 2022-07-20 (×3): qty 8

## 2022-07-20 MED ORDER — SODIUM CHLORIDE 0.9 % IV SOLN
8.0000 mg | Freq: Four times a day (QID) | INTRAVENOUS | Status: DC
  Administered 2022-07-21 – 2022-07-22 (×4): 8 mg via INTRAVENOUS
  Filled 2022-07-20 (×8): qty 4

## 2022-07-20 MED ORDER — TRAVASOL 10 % IV SOLN
INTRAVENOUS | Status: AC
  Filled 2022-07-20: qty 936

## 2022-07-20 MED ORDER — POTASSIUM CHLORIDE 10 MEQ/100ML IV SOLN
10.0000 meq | INTRAVENOUS | Status: AC
  Administered 2022-07-20 (×3): 10 meq via INTRAVENOUS
  Filled 2022-07-20 (×3): qty 100

## 2022-07-20 NOTE — Progress Notes (Signed)
   Subjective: Patient was evaluated at bedside. Patient reports one episode of vomiting yesterday evening and one episode of vomiting during the night but otherwise no acute events. She denies any abdominal pain or other concerns.   Objective:  Vital signs in last 24 hours: Vitals:   07/19/22 0907 07/19/22 1642 07/20/22 0540 07/20/22 0748  BP: 105/82 127/82 (!) 121/92 125/88  Pulse: 84 84 89 89  Resp: 18 17 18 18   Temp: 98.3 F (36.8 C) 99.3 F (37.4 C) 98.1 F (36.7 C) 98 F (36.7 C)  TempSrc: Oral Oral    SpO2: 100% 100% 100% 98%  Weight:      Height:       CMP     Component Value Date/Time   NA 135 07/20/2022 0428   K 3.6 07/20/2022 0428   CL 100 07/20/2022 0428   CO2 26 07/20/2022 0428   GLUCOSE 129 (H) 07/20/2022 0428   BUN 10 07/20/2022 0428   CREATININE 0.76 07/20/2022 0428   CALCIUM 9.2 07/20/2022 0428   PROT 7.3 07/19/2022 0311   ALBUMIN 3.8 07/19/2022 0311   AST 14 (L) 07/19/2022 0311   ALT 17 07/19/2022 0311   ALKPHOS 65 07/19/2022 0311   BILITOT 0.3 07/19/2022 0311   GFRNONAA >60 07/20/2022 0428   Physical Exam Constitutional:      Appearance: Normal appearance. She is well-developed.  HENT:     Head: Normocephalic and atraumatic.     Mouth/Throat:     Mouth: Mucous membranes are moist.  Abdominal:     General: Abdomen is flat. Bowel sounds are normal.     Palpations: Abdomen is soft.  Neurological:     General: No focal deficit present.     Mental Status: She is alert and oriented to person, place, and time. Mental status is at baseline.  Psychiatric:        Mood and Affect: Mood and affect normal.     Assessment/Plan:  Active Problems:   Intractable nausea and vomiting   Malnutrition of moderate degree  #Intractable Nausea and Vomiting #Prolonged QTc Interval Patient began TPN on 7/3 and continues to improve with signs and symptoms.  She had 2 vomiting episodes yesterday but noted an appetite this morning.  She continues to mildly  tolerate liquids.  Overall, her energy continues to improve.  Today, we will trial a soft diet.  Her Tigan, Phenergan, and capsaicin were discontinued as these were not being used.  She has declined Lovenox for DVT prophylaxis so we encouraged her to continue to walk the hallways and more out of bed activity. -Continue IV Reglan, IV Zofran, and scopolamine patch -Discontinue Tigan, Phenergan, and capsaicin -Encouraged more out of bed activity -Continued QTc monitoring via daily EKG (7/6 was 459 ms)  #Generalized abdominal pain This morning, patient denied any abdominal pain that has likely been secondary to vomiting.  #Hypokalemia K today was 3.6.  Continue to trend BMP and replenish electrolytes as needed.  Diet: Clear liquid diet, advance to soft; currently on TPN IVF: Fluids: 0.9NS, Rate:  10  mL/hr continuous VTE: enoxaparin (LOVENOX) injection 40 mg and continue out of bed activity Code: Full DISPO: Anticipated discharge in 2 days to Home pending  diet tolerance    Morrie Sheldon, MD 07/20/2022, 8:34 AM Pager: [phone] After 5pm on weekdays and 1pm on weekends: On Call pager [phone]

## 2022-07-20 NOTE — Progress Notes (Signed)
PHARMACY - TOTAL PARENTERAL NUTRITION CONSULT NOTE  Indication:   intractable N/V, inability to tolerate PO/NG  Patient Measurements: Height: 5\' 2"  (157.5 cm) Weight: 60.4 kg (133 lb 2.5 oz) IBW/kg (Calculated) : 50.1 TPN AdjBW (KG): 54.5 Body mass index is 24.35 kg/m. Usual Weight: 63 kg  Assessment:  32 y.o. admitted with severe cyclic vomiting syndrome. Per chart review,  is currently not pregnant, has had several previous related admissions which were in the context of hyperemesis gravidarum. Per MD, may be associated with THC use. Attempted core track placement for duodenal trickle feeds overnight. Patient unable to tolerate (continued nausea/vomiting) and asked RN to remove core track shortly thereafter. During discussion today, [name] and states she is still attempting to eat but continues to have nausea. Rph discussed TPN process with patient and family, [name], all questions answered at this time. Patient continued on IV fluids, Phenergan, scopolamine, Reglan, and Toradol. Pharmacy consulted to dose TPN for short-term nutrition management.   Glucose / Insulin:  no hx DM - CBGs < 180.  No SSI use in the past 24 hrs Electrolytes: K 3.6 post 3 runs, others WNL (Phos trending up) Renal: SCr < 1, BUN WNL Hepatic: LFTs / tbili WNL, albumin WNL at 3.8 Intake / Output; MIVF: UOP not measured, emesis x1 on 7/5, LBM charted 7/5 GI Imaging: 6/27 CT: no evidence of intestinal obstruction or pneumoperitoneum. Possible nonspecific colitis.  GI Surgeries / Procedures: none since TPN initiation  Central access: 07/17/22 TPN start date: 07/17/22  Nutritional Goals:  RD Estimated Needs Total Energy Estimated Needs: 1800-2000 kcals Total Protein Estimated Needs: 90-105 g Total Fluid Estimated Needs: >/= 1.8 L  Current Nutrition:  TPN CLD - Boost Breeze TID 0/3 tolerated on 7/5   Plan: TPN at 75 ml/hr will provide 94g  and 1832 kCal, meeting 100% of needs Electrolytes in TPN: Na 57mEq/L, K 65 mEq/L, Ca 86mEq/L, Mg 29mEq/L, Phos 67mmol/L. Cl:Ac 1:1  Add standard MVI and trace elements to TPN Continue sensitive SSI Q6H for now Thiamine 100mg  IV daily x 5 days per MD/RD KCL x3  Monitor TPN labs on Mon/Thurs - BMET in AM F/U PO intake/diet advancement to begin weaning TPN   Calton Dach, PharmD Clinical Pharmacist 07/20/2022 7:07 AM

## 2022-07-21 DIAGNOSIS — E876 Hypokalemia: Secondary | ICD-10-CM | POA: Diagnosis not present

## 2022-07-21 DIAGNOSIS — G43A0 Cyclical vomiting, not intractable: Secondary | ICD-10-CM | POA: Diagnosis not present

## 2022-07-21 LAB — BASIC METABOLIC PANEL
Anion gap: 10 (ref 5–15)
BUN: 11 mg/dL (ref 6–20)
CO2: 23 mmol/L (ref 22–32)
Calcium: 9.4 mg/dL (ref 8.9–10.3)
Chloride: 102 mmol/L (ref 98–111)
Creatinine, Ser: 0.8 mg/dL (ref 0.44–1.00)
GFR, Estimated: >60 mL/min (ref >60)
Glucose, Bld: 136 mg/dL — ABNORMAL HIGH (ref 70–99)
Potassium: 3.8 mmol/L (ref 3.5–5.1)
Sodium: 135 mmol/L (ref 135–145)

## 2022-07-21 LAB — GLUCOSE, CAPILLARY
Glucose-Capillary: 102 mg/dL — ABNORMAL HIGH (ref 70–99)
Glucose-Capillary: 120 mg/dL — ABNORMAL HIGH (ref 70–99)
Glucose-Capillary: 141 mg/dL — ABNORMAL HIGH (ref 70–99)
Glucose-Capillary: 141 mg/dL — ABNORMAL HIGH (ref 70–99)
Glucose-Capillary: 142 mg/dL — ABNORMAL HIGH (ref 70–99)
Glucose-Capillary: 175 mg/dL — ABNORMAL HIGH (ref 70–99)

## 2022-07-21 MED ORDER — POTASSIUM CHLORIDE 10 MEQ/100ML IV SOLN
10.0000 meq | INTRAVENOUS | Status: AC
  Administered 2022-07-21 (×2): 10 meq via INTRAVENOUS
  Filled 2022-07-21 (×2): qty 100

## 2022-07-21 NOTE — Progress Notes (Signed)
   07/21/22 1931  Assess: MEWS Score  Temp 98.6 F (37 C)  BP 109/78  MAP (mmHg) 89  Pulse Rate (!) 115  SpO2 100 %  O2 Device Room Air  Assess: MEWS Score  MEWS Temp 0  MEWS Systolic 0  MEWS Pulse 2  MEWS RR 0  MEWS LOC 0  MEWS Score 2  MEWS Score Color Yellow  Assess: if the MEWS score is Yellow or Red  Were vital signs taken at a resting state? Yes  Provider Notification  Provider Name/Title ON call MD  Date Provider Notified 07/21/22  Time Provider Notified 0740  Method of Notification Page  Notification Reason Other (Comment) (Yellow Mews)  Provider response Other (Comment) (Notify on call if pt. is symptomatic)  Assess: SIRS CRITERIA  SIRS Temperature  0  SIRS Pulse 1  SIRS Respirations  0  SIRS WBC 0  SIRS Score Sum  1

## 2022-07-21 NOTE — Progress Notes (Signed)
PHARMACY - TOTAL PARENTERAL NUTRITION CONSULT NOTE  Indication:   intractable N/V, inability to tolerate PO/NG  Patient Measurements: Height: 5\' 2"  (157.5 cm) Weight: 60.2 kg (132 lb 11.5 oz) IBW/kg (Calculated) : 50.1 TPN AdjBW (KG): 54.5 Body mass index is 24.27 kg/m. Usual Weight: 63 kg  Assessment:  32 y.o. admitted with severe cyclic vomiting syndrome. Per chart review,  is currently not pregnant, has had several previous related admissions which were in the context of hyperemesis gravidarum. Per MD, may be associated with THC use. Attempted core track placement for duodenal trickle feeds overnight. Patient unable to tolerate (continued nausea/vomiting) and asked RN to remove core track shortly thereafter. During discussion today, [name] and states she is still attempting to eat but continues to have nausea. Rph discussed TPN process with patient and family, [name], all questions answered at this time. Patient continued on IV fluids, Phenergan, scopolamine, Reglan, and Toradol. Pharmacy consulted to dose TPN for short-term nutrition management.   Glucose / Insulin:  no hx DM - CBGs < 180. 1u SSI in 24h Electrolytes: K 3.8 post 3 runs, others WNL (Phos trending up) Renal: SCr < 1, BUN WNL Hepatic: LFTs / tbili WNL, albumin WNL at 3.8 Intake / Output; MIVF: UOP not measured, LBM charted 7/5 GI Imaging: 6/27 CT: no evidence of intestinal obstruction or pneumoperitoneum. Possible nonspecific colitis.  GI Surgeries / Procedures: none since TPN initiation  Central access: 07/17/22 TPN start date: 07/17/22  Nutritional Goals:  RD Estimated Needs Total Energy Estimated Needs: 1800-2000 kcals Total Protein Estimated Needs: 90-105 g Total Fluid Estimated Needs: >/= 1.8 L  Current Nutrition:  TPN Soft diet (7/6): reglan x2 PRN   Plan: Stop TPN per primary team - Allow current bag to finish infusing Wean TPN via:  reduce to 1/2 rate (77ml/hr) for one hour then stop TPN - discussed with bedside RN   Continue SSI and encourage PO intake     Calton Dach, PharmD Clinical Pharmacist 07/21/2022 7:17 AM

## 2022-07-21 NOTE — Progress Notes (Signed)
   07/21/22 1931  Assess: MEWS Score  Temp 98.6 F (37 C)  BP 109/78  MAP (mmHg) 89  Pulse Rate (!) 115  SpO2 100 %  O2 Device Room Air  Assess: MEWS Score  MEWS Temp 0  MEWS Systolic 0  MEWS Pulse 2  MEWS RR 0  MEWS LOC 0  MEWS Score 2  MEWS Score Color Yellow  Assess: if the MEWS score is Yellow or Red  Were vital signs taken at a resting state? Yes  Focused Assessment No change from prior assessment  Does the patient meet 2 or more of the SIRS criteria? No  MEWS guidelines implemented  Yes, yellow  Treat  MEWS Interventions Considered administering scheduled or prn medications/treatments as ordered  Take Vital Signs  Increase Vital Sign Frequency  Yellow: Q2hr x1, continue Q4hrs until patient remains green for 12hrs  Escalate  MEWS: Escalate Yellow: Discuss with charge nurse and consider notifying provider and/or RRT  Provider Notification  Provider Name/Title ON call MD  Date Provider Notified 07/21/22  Time Provider Notified 0740  Method of Notification Page  Notification Reason Other (Comment) (Yellow Mews)  Provider response Other (Comment) (Notify on call if pt. is symptomatic)  Assess: SIRS CRITERIA  SIRS Temperature  0  SIRS Pulse 1  SIRS Respirations  0  SIRS WBC 0  SIRS Score Sum  1

## 2022-07-21 NOTE — Progress Notes (Signed)
   HD#9 SUBJECTIVE:  Patient Summary: Krista Stout is a 32 y.o. with a pertinent PMH of cyclic vomiting syndrome and hyperemesis gravidarum, who presented with n/v and admitted for cyclic vomiting syndrome.   Overnight Events: No acute events overnight  Interim History: The patient notes that she did well advancing her diet yesterday - she ate yogurt, some broccoli, and a few bites of pizza. She denies any further n/v and her energy is improved.  OBJECTIVE:  Vital Signs: Vitals:   07/20/22 1551 07/20/22 2120 07/21/22 0426 07/21/22 0500  BP: 125/86 117/79 113/80   Pulse: 86 75 95   Resp: 18     Temp: 98.8 F (37.1 C) 98.1 F (36.7 C) 98.2 F (36.8 C)   TempSrc: Oral Oral Oral   SpO2: 100% 100% 96%   Weight:    60.2 kg  Height:       Supplemental O2: Room Air SpO2: 96 %  Filed Weights   07/18/22 0558 07/19/22 0408 07/21/22 0500  Weight: 60.9 kg 60.4 kg 60.2 kg     Intake/Output Summary (Last 24 hours) at 07/21/2022 0634 Last data filed at 07/21/2022 0400 Gross per 24 hour  Intake 1264.84 ml  Output --  Net 1264.84 ml   Net IO Since Admission: 5,375.6 mL [07/21/22 0634]  Physical Exam: General: Pleasant, well-appearing female laying in bed. No acute distress. Pulmonary: Normal effort.  Abdominal: Soft, nontender, nondistended. Normal bowel sounds.  Skin: Warm and dry.  Neuro: Moves all extremities spontaneously and has good strength. No focal deficit. Psych: Normal mood and affect     ASSESSMENT/PLAN:  Assessment: Active Problems:   Intractable nausea and vomiting   Malnutrition of moderate degree   Plan: Cyclic vomiting syndrome Prolonged QTc internal TPN initiated on 7/3 while patient was still on clear liquid diet. She was feeling well yesterday and we advanced her diet to softs, and she did well with this. She had no nausea or vomiting and wants to continue advancing her diet. Energy continues to improve and she ambulates throughout the halls. QT  .  - Antiemetics: Reglan, Zofran, scopolamine patch  - Advance diet as tolerated - Will discontinue TPN today  Best Practice: Diet: Regular diet IVF: Fluids: none VTE: Declined lovenox, patient is ambulatory Code: Full AB: None DISPO: Anticipated discharge today vs tomorrow to Home pending  Discontinuation of TPN .  Signature: Elza Rafter, D.O.  Internal Medicine Resident, PGY-3 [facility]  Pager: [phone] 6:34 AM, 07/21/2022   Please contact the on call pager after 5 pm and on weekends at [phone].

## 2022-07-21 NOTE — Progress Notes (Signed)
Tried to get EKG on pt twice pr NT, machine unable to read. Will try again later.

## 2022-07-22 DIAGNOSIS — G43A0 Cyclical vomiting, not intractable: Secondary | ICD-10-CM | POA: Diagnosis not present

## 2022-07-22 DIAGNOSIS — E876 Hypokalemia: Secondary | ICD-10-CM | POA: Diagnosis not present

## 2022-07-22 LAB — TSH: TSH: 0.364 u[IU]/mL (ref 0.350–4.500)

## 2022-07-22 LAB — GLUCOSE, CAPILLARY
Glucose-Capillary: 127 mg/dL — ABNORMAL HIGH (ref 70–99)
Glucose-Capillary: 97 mg/dL (ref 70–99)

## 2022-07-22 LAB — T4, FREE: Free T4: 1.41 ng/dL — ABNORMAL HIGH (ref 0.61–1.12)

## 2022-07-22 MED ORDER — ONDANSETRON 4 MG PO TBDP: 4.0000 mg | ORAL_TABLET | Freq: Three times a day (TID) | ORAL | 0 refills | Status: AC | PRN

## 2022-07-22 NOTE — Progress Notes (Signed)
Arrived to this patient's room. And explained procedure to patient. HOB less than 45*. Held breath with line removal. Pressure held for . No s/sx of bleeding at site. Pressure drsg applied. Instructed to remain in bed for and keep pressure drsg CDI for 24 hrs. And reporting any s/sx of bleeding, holding pressure to site until bleeding stops. Patient VU. Tomasita Morrow, RN VAST

## 2022-07-22 NOTE — Discharge Summary (Signed)
 Name: Krista Stout MRN: [id number] DOB: 02/02/1990 32 y.o. PCP: Pcp, No  Date of Admission: 07/11/2022  2:56 AM Date of Discharge:  07/22/22 Attending Physician: Dr. Vincent  DISCHARGE DIAGNOSIS:  Primary Problem: Intractable Nausea and Vomiting   Hospital Problems: Active Problems:   Intractable nausea and vomiting   Malnutrition of moderate degree    DISCHARGE MEDICATIONS:   Allergies as of 07/22/2022   No Known Allergies      Medication List     STOP taking these medications    diclofenac 75 MG EC tablet Commonly known as: VOLTAREN   gabapentin 300 MG capsule Commonly known as: NEURONTIN   megestrol 40 MG tablet Commonly known as: MEGACE   metoCLOPramide 10 MG tablet Commonly known as: REGLAN   oxyCODONE 5 MG immediate release tablet Commonly known as: Roxicodone       TAKE these medications    ondansetron 4 MG disintegrating tablet Commonly known as: ZOFRAN-ODT Take 1 tablet (4 mg total) by mouth every 8 (eight) hours as needed for up to 7 days for nausea. What changed:  medication strength how much to take reasons to take this   promethazine 25 MG suppository Commonly known as: PHENERGAN Place 1 suppository (25 mg total) rectally every 6 (six) hours as needed for nausea or vomiting.        DISPOSITION AND FOLLOW-UP:  Krista Stout was discharged from [facility] in Stable condition. At the hospital follow up visit please address:  Cyclic Vomiting Syndrome: Discharged with ondansetron 4 mg and promethazine 25 mg suppository as needed Incidental Left Adnexal Cyst, 5.2 cm: Follow-up with gynecology for further workup  Asymptomatic Tachycardia: TSH, free T4, and T3 values pending at discharge Labs / imaging needed at time of follow-up: Transvaginal ultrasound Pending labs/ test needing follow-up: TSH, free T4, and T3   Follow-up Appointments:  Follow-up Information     Youssefzadeh, Keon, MD .   Specialty:  Internal Medicine Contact information: [address] [place] [address] [phone]                 HOSPITAL COURSE:  Patient Summary: #Cyclic vomiting syndrome #Prolonged QTc interval #Abdominal Pain Krista Stout is a 32 yo female who presented with intractable nausea and vomiting and was admitted with severe, cyclic vomiting syndrome. She is not pregnant, and has had previous admissions for nausea and vomiting in the setting of hyperemesis gravidarum. Her cyclic vomiting syndrome may be associated with THC use. She received maximum supportive care and continued to have nausea/vomiting and was not able to take in PO intake/nutrition. Her severe nausea and vomiting also caused significant abdominal pain and discomfort during her hospitalization, requiring the use of IV dilaudid, which was discontinued when pain improved. The patient also had a Cortrak placed for tube feeds, however, she did not tolerate the tube and had vomiting after it was placed. After one week without any significant PO intake, the decision was made to start TPN. The patient started TPN on 7/3, while also maintaining a clear liquid diet. She did well with TPN and her energy improved and overall her nausea and vomiting resolved. She received TPN for 4 days. We slowly advanced her diet and by the time of discharge she was able to eat a normal diet. TPN was discontinued. The patient was medically stable for discharge. Discharged with PRN zofran and reglan.   #Asymptomatic Tachycardia  Intermittent episodes of tachycardia noted with heart rate seen in the   130s.  EKG demonstrated normal sinus rhythm and no concern for torsades de pointes as at discharge.  #Hypokalemia K low throughout stay likely secondary to repetitive vomiting. Replenished with IV Kcl 10 mEq as needed.   #Adnexal cyst Incidentally noted on imaging at admission, 5.2 cm left adnexal cyst. Recommend outpatient follow up.    DISCHARGE INSTRUCTIONS:    Discharge Instructions     Call MD for:  persistant dizziness or light-headedness   Complete by: As directed    Call MD for:  persistant nausea and vomiting   Complete by: As directed    Call MD for:  severe uncontrolled pain   Complete by: As directed    Diet general   Complete by: As directed    Discharge instructions   Complete by: As directed    Dear Krista Stout,  You were hospitalized for persistent vomiting. We strongly encouraged you to stop smoking marijuana as this can worsen your nausea and vomiting.   We *STOPPED diclofenac, oxycodone, megestrol, gabapentin, and metoclopramide. For your vomiting, continue to take the phenergan and Zofran as needed when you are nauseous.   Please continue to slowly reintroduce solid foods as tolerated and avoid large meals as your diet improves.   We also treated your low potassium that was thought to be caused by your vomiting. Please ensure you drink adequate fluids and stay hydrated.   You are scheduled for a hospital follow-up appointment with Dr. Keon Youssefzadeh on 7/16 at 3 PM. Our clinic is located on the group floor of [facility]. If you have any questions, please call us at [phone].   Increase activity slowly   Complete by: As directed        SUBJECTIVE:  Patient was evaluated at bedside.  No acute events overnight.  Patient able to tolerate solid foods yesterday with continued increased energy.  Denies any nausea, vomiting, abdominal pain, lightheadedness, or other concerns.   Discharge Vitals:   BP 100/73 (BP Location: Left Arm)   Pulse 91   Temp 98.5 F (36.9 C) (Oral)   Resp 16   Ht 5' 2" (1.575 m)   Wt 60.2 kg   LMP 06/26/2022   SpO2 100%   BMI 24.27 kg/m   OBJECTIVE:  Physical Exam Constitutional:      Appearance: Normal appearance.  HENT:     Head: Normocephalic and atraumatic.  Pulmonary:     Effort: Pulmonary effort is normal.  Skin:    General: Skin is warm.  Neurological:      General: No focal deficit present.     Mental Status: She is alert and oriented to person, place, and time.  Psychiatric:        Attention and Perception: Attention normal.        Mood and Affect: Mood and affect normal.     Pertinent Labs, Studies, and Procedures:     Latest Ref Rng & Units 07/16/2022    1:25 AM 07/14/2022   12:29 AM 07/13/2022   12:43 AM  CBC  WBC 4.0 - 10.5 K/uL 4.8  7.5  7.8   Hemoglobin 12.0 - 15.0 g/dL 12.7  13.6  14.2   Hematocrit 36.0 - 46.0 % 37.9  41.2  41.9   Platelets 150 - 400 K/uL 230  238  267        Latest Ref Rng & Units 07/21/2022    3:17 AM 07/20/2022    4:28 AM 07/19/2022    3:11 AM    CMP  Glucose 70 - 99 mg/dL 136  129  134   BUN 6 - 20 mg/dL 11  10  6   Creatinine 0.44 - 1.00 mg/dL 0.80  0.76  0.75   Sodium 135 - 145 mmol/L 135  135  136   Potassium 3.5 - 5.1 mmol/L 3.8  3.6  3.4   Chloride 98 - 111 mmol/L 102  100  102   CO2 22 - 32 mmol/L 23  26  25   Calcium 8.9 - 10.3 mg/dL 9.4  9.2  8.9   Total Protein 6.5 - 8.1 g/dL   7.3   Total Bilirubin 0.3 - 1.2 mg/dL   0.3   Alkaline Phos 38 - 126 U/L   65   AST 15 - 41 U/L   14   ALT 0 - 44 U/L   17     CT ABDOMEN PELVIS W CONTRAST  Result Date: 07/11/2022 CLINICAL DATA:  Abdominal pain, nausea, vomiting EXAM: CT ABDOMEN AND PELVIS WITH CONTRAST TECHNIQUE: Multidetector CT imaging of the abdomen and pelvis was performed using the standard protocol following bolus administration of intravenous contrast. RADIATION DOSE REDUCTION: This exam was performed according to the departmental dose-optimization program which includes automated exposure control, adjustment of the mA and/or kV according to patient size and/or use of iterative reconstruction technique. CONTRAST:  75mL OMNIPAQUE IOHEXOL 350 MG/ML SOLN COMPARISON:  04/16/2022 FINDINGS: Lower chest: Visualized lower lung fields are clear. Hepatobiliary: No focal abnormalities are seen in liver. Gallbladder is unremarkable. There is no dilation of bile  ducts. Pancreas: No focal abnormalities are seen. Spleen: Unremarkable. Adrenals/Urinary Tract: Adrenals are unremarkable. There is no hydronephrosis. There are no renal or ureteral stones. Urinary bladder is not distended. Stomach/Bowel: Small hiatal hernia is seen. Stomach is unremarkable. Small bowel loops are not dilated. Appendix is not dilated. There is incomplete distention of colon. There is mild diffuse wall thickening in the ascending and transverse colon. There is no pericolic stranding. Vascular/Lymphatic: Unremarkable. Reproductive: There is 5.2 cm smooth marginated fluid density lesion in the left adnexa. There is moderate amount of free fluid in pelvis. There is 1.7 cm smooth magnesia fluid density lesion in the right labium, possibly Bartholin cyst. Other: There is no ascites or pneumoperitoneum. Musculoskeletal: No acute findings are seen. IMPRESSION: There is no evidence of intestinal obstruction or pneumoperitoneum. Appendix is not dilated. There is no hydronephrosis. There is 5.2 cm cyst in the left adnexa, possibly functional ovarian cysts. There is moderate amount of free fluid in pelvis suggesting recent rupture of ovarian cyst or follicle. Follow-up pelvic sonogram as clinically warranted may be considered. There is mild diffuse wall thickening in ascending and transverse colon which may be due to incomplete distention or suggest nonspecific colitis. There is no pericolic stranding or fluid collection. Small hiatal hernia. Possible Bartholin cyst is seen in the right labium. Electronically Signed   By: Palani  Rathinasamy M.D.   On: 07/11/2022 12:13    Signed: Banjamin Stovall, MD Internal Medicine Resident, PGY-1 [facility]  Pager: #[phone] 

## 2022-07-22 NOTE — Plan of Care (Signed)

## 2022-07-22 NOTE — Progress Notes (Signed)
Patient being discharged home, self-care. PIV removed, catheter intact. PICC removed by IV team, patient remained in room for 30 minutes after removal. AVS discussed, pt verbalized understanding. Patient in NAD prior to leaving the unit.

## 2022-07-22 NOTE — Progress Notes (Signed)
Pt noted to have increased HR this AM up to 140s.  She denies any chest pain, shob or dizziness.  EKG completed and showed ST at 108.  Of note, pt's HR triggered a yellow MEWS yesterday evening, but HR had been around 100 since 2130 vital sign check.  Modena Slater, DO (Pager: [phone]) paged and report given to oncoming Rn's.    07/22/22 0724  Vitals  Temp 98.2 F (36.8 C)  Temp Source Oral  BP 102/77  MAP (mmHg) 84  BP Location Left Arm  BP Method Automatic  Patient Position (if appropriate) Lying  Pulse Rate (!) 134  Pulse Rate Source Monitor  Resp 16  MEWS COLOR  MEWS Score Color Yellow  Oxygen Therapy  SpO2 100 %  O2 Device Room Air  Pain Assessment  Pain Scale 0-10  Pain Score 0  POSS Scale (Pasero Opioid Sedation Scale)  POSS *See Group Information* 1-Acceptable,Awake and alert  MEWS Score  MEWS Temp 0  MEWS Systolic 0  MEWS Pulse 3  MEWS RR 0  MEWS LOC 0  MEWS Score 3   Hilton Sinclair BSN RN [facility] 07/22/2022, 7:50 AM

## 2022-07-22 NOTE — Progress Notes (Deleted)
Name: Krista Stout MRN: [id number] DOB: 12/17/1990 32 y.o. PCP: Pcp, No  Date of Admission: 07/11/2022  2:56 AM Date of Discharge:  07/22/22 Attending Physician: Dr. Oswaldo Done  DISCHARGE DIAGNOSIS:  Primary Problem: Intractable Nausea and Vomiting   Hospital Problems: Active Problems:   Intractable nausea and vomiting   Malnutrition of moderate degree    DISCHARGE MEDICATIONS:   Allergies as of 07/22/2022   No Known Allergies      Medication List     STOP taking these medications    diclofenac 75 MG EC tablet Commonly known as: VOLTAREN   gabapentin 300 MG capsule Commonly known as: NEURONTIN   megestrol 40 MG tablet Commonly known as: MEGACE   metoCLOPramide 10 MG tablet Commonly known as: REGLAN   oxyCODONE 5 MG immediate release tablet Commonly known as: Roxicodone       TAKE these medications    ondansetron 4 MG disintegrating tablet Commonly known as: ZOFRAN-ODT Take 1 tablet (4 mg total) by mouth every 8 (eight) hours as needed for up to 7 days for nausea. What changed:  medication strength how much to take reasons to take this   promethazine 25 MG suppository Commonly known as: PHENERGAN Place 1 suppository (25 mg total) rectally every 6 (six) hours as needed for nausea or vomiting.        DISPOSITION AND FOLLOW-UP:  Krista Stout was discharged from [facility] in Stable condition. At the hospital follow up visit please address:  Cyclic Vomiting Syndrome: Discharged with ondansetron 4 mg and promethazine 25 mg suppository as needed Incidental Left Adnexal Cyst, 5.2 cm: Follow-up with gynecology for further workup  Asymptomatic Tachycardia: TSH, free T4, and T3 values pending at discharge Labs / imaging needed at time of follow-up: Transvaginal ultrasound Pending labs/ test needing follow-up: TSH, free T4, and T3   Follow-up Appointments:  Follow-up Information     Lovie Macadamia, MD .   Specialty:  Internal Medicine Contact information: [address] [place] [address] [phone]                 HOSPITAL COURSE:  Patient Summary: #Cyclic vomiting syndrome #Prolonged QTc interval #Abdominal Pain Krista Stout is a 32 yo female who presented with intractable nausea and vomiting and was admitted with severe, cyclic vomiting syndrome. She is not pregnant, and has had previous admissions for nausea and vomiting in the setting of hyperemesis gravidarum. Her cyclic vomiting syndrome may be associated with THC use. She received maximum supportive care and continued to have nausea/vomiting and was not able to take in PO intake/nutrition. Her severe nausea and vomiting also caused significant abdominal pain and discomfort during her hospitalization, requiring the use of IV dilaudid, which was discontinued when pain improved. The patient also had a Cortrak placed for tube feeds, however, she did not tolerate the tube and had vomiting after it was placed. After one week without any significant PO intake, the decision was made to start TPN. The patient started TPN on 7/3, while also maintaining a clear liquid diet. She did well with TPN and her energy improved and overall her nausea and vomiting resolved. She received TPN for 4 days. We slowly advanced her diet and by the time of discharge she was able to eat a normal diet. TPN was discontinued. The patient was medically stable for discharge. Discharged with PRN zofran and reglan.   #Asymptomatic Tachycardia  Intermittent episodes of tachycardia noted with heart rate seen in the  130s.  EKG demonstrated normal sinus rhythm and no concern for torsades de pointes as at discharge.  #Hypokalemia K low throughout stay likely secondary to repetitive vomiting. Replenished with IV Kcl 10 mEq as needed.   #Adnexal cyst Incidentally noted on imaging at admission, 5.2 cm left adnexal cyst. Recommend outpatient follow up.    DISCHARGE INSTRUCTIONS:    Discharge Instructions     Call MD for:  persistant dizziness or light-headedness   Complete by: As directed    Call MD for:  persistant nausea and vomiting   Complete by: As directed    Call MD for:  severe uncontrolled pain   Complete by: As directed    Diet general   Complete by: As directed    Discharge instructions   Complete by: As directed    Dear Krista Stout,  You were hospitalized for persistent vomiting. We strongly encouraged you to stop smoking marijuana as this can worsen your nausea and vomiting.   We *STOPPED diclofenac, oxycodone, megestrol, gabapentin, and metoclopramide. For your vomiting, continue to take the phenergan and Zofran as needed when you are nauseous.   Please continue to slowly reintroduce solid foods as tolerated and avoid large meals as your diet improves.   We also treated your low potassium that was thought to be caused by your vomiting. Please ensure you drink adequate fluids and stay hydrated.   You are scheduled for a hospital follow-up appointment with Dr. Lovie Macadamia on 7/16 at 3 PM. Our clinic is located on the group floor of [facility]. If you have any questions, please call us at [phone].   Increase activity slowly   Complete by: As directed        SUBJECTIVE:  Patient was evaluated at bedside.  No acute events overnight.  Patient able to tolerate solid foods yesterday with continued increased energy.  Denies any nausea, vomiting, abdominal pain, lightheadedness, or other concerns.   Discharge Vitals:   BP 100/73 (BP Location: Left Arm)   Pulse 91   Temp 98.5 F (36.9 C) (Oral)   Resp 16   Ht 5\' 2"  (1.575 m)   Wt 60.2 kg   LMP 06/26/2022   SpO2 100%   BMI 24.27 kg/m   OBJECTIVE:  Physical Exam Constitutional:      Appearance: Normal appearance.  HENT:     Head: Normocephalic and atraumatic.  Pulmonary:     Effort: Pulmonary effort is normal.  Skin:    General: Skin is warm.  Neurological:      General: No focal deficit present.     Mental Status: She is alert and oriented to person, place, and time.  Psychiatric:        Attention and Perception: Attention normal.        Mood and Affect: Mood and affect normal.     Pertinent Labs, Studies, and Procedures:     Latest Ref Rng & Units 07/16/2022    1:25 AM 07/14/2022   12:29 AM 07/13/2022   12:43 AM  CBC  WBC 4.0 - 10.5 K/uL 4.8  7.5  7.8   Hemoglobin 12.0 - 15.0 g/dL 65.7  84.6  96.2   Hematocrit 36.0 - 46.0 % 37.9  41.2  41.9   Platelets 150 - 400 K/uL 230  238  267        Latest Ref Rng & Units 07/21/2022    3:17 AM 07/20/2022    4:28 AM 07/19/2022    3:11 AM  CMP  Glucose 70 - 99 mg/dL 191  478  295   BUN 6 - 20 mg/dL 11  10  6    Creatinine 0.44 - 1.00 mg/dL 6.21  3.08  6.57   Sodium 135 - 145 mmol/L 135  135  136   Potassium 3.5 - 5.1 mmol/L 3.8  3.6  3.4   Chloride 98 - 111 mmol/L 102  100  102   CO2 22 - 32 mmol/L 23  26  25    Calcium 8.9 - 10.3 mg/dL 9.4  9.2  8.9   Total Protein 6.5 - 8.1 g/dL   7.3   Total Bilirubin 0.3 - 1.2 mg/dL   0.3   Alkaline Phos 38 - 126 U/L   65   AST 15 - 41 U/L   14   ALT 0 - 44 U/L   17     CT ABDOMEN PELVIS W CONTRAST  Result Date: 07/11/2022 CLINICAL DATA:  Abdominal pain, nausea, vomiting EXAM: CT ABDOMEN AND PELVIS WITH CONTRAST TECHNIQUE: Multidetector CT imaging of the abdomen and pelvis was performed using the standard protocol following bolus administration of intravenous contrast. RADIATION DOSE REDUCTION: This exam was performed according to the departmental dose-optimization program which includes automated exposure control, adjustment of the mA and/or kV according to patient size and/or use of iterative reconstruction technique. CONTRAST:  75mL OMNIPAQUE IOHEXOL 350 MG/ML SOLN COMPARISON:  04/16/2022 FINDINGS: Lower chest: Visualized lower lung fields are clear. Hepatobiliary: No focal abnormalities are seen in liver. Gallbladder is unremarkable. There is no dilation of bile  ducts. Pancreas: No focal abnormalities are seen. Spleen: Unremarkable. Adrenals/Urinary Tract: Adrenals are unremarkable. There is no hydronephrosis. There are no renal or ureteral stones. Urinary bladder is not distended. Stomach/Bowel: Small hiatal hernia is seen. Stomach is unremarkable. Small bowel loops are not dilated. Appendix is not dilated. There is incomplete distention of colon. There is mild diffuse wall thickening in the ascending and transverse colon. There is no pericolic stranding. Vascular/Lymphatic: Unremarkable. Reproductive: There is 5.2 cm smooth marginated fluid density lesion in the left adnexa. There is moderate amount of free fluid in pelvis. There is 1.7 cm smooth magnesia fluid density lesion in the right labium, possibly Bartholin cyst. Other: There is no ascites or pneumoperitoneum. Musculoskeletal: No acute findings are seen. IMPRESSION: There is no evidence of intestinal obstruction or pneumoperitoneum. Appendix is not dilated. There is no hydronephrosis. There is 5.2 cm cyst in the left adnexa, possibly functional ovarian cysts. There is moderate amount of free fluid in pelvis suggesting recent rupture of ovarian cyst or follicle. Follow-up pelvic sonogram as clinically warranted may be considered. There is mild diffuse wall thickening in ascending and transverse colon which may be due to incomplete distention or suggest nonspecific colitis. There is no pericolic stranding or fluid collection. Small hiatal hernia. Possible Bartholin cyst is seen in the right labium. Electronically Signed   By: Ernie Avena M.D.   On: 07/11/2022 12:13    Signed: Morrie Sheldon, MD Internal Medicine Resident, PGY-1 [facility]  Pager: [phone]

## 2022-07-22 NOTE — Progress Notes (Signed)
IVT consult placed for lab work and PICC line removal pending discharge. No discharge orders placed. Spoke to primary RN and requested remove PICC order be placed when patient is being discharged to ensure patient has sufficient access. Raquel to have PICC removal order placed once discharge orders submitted.   Lynx Goodrich Loyola Mast, RN

## 2022-07-22 NOTE — Progress Notes (Signed)
Received patient with a yellow mews 3 due to an elevated heart rate, patient otherwise asymptomatic.  Night shift nurse paged the on-call physician.  Received the phone call back from Dr. Allena Katz, notified that primary team would be made aware and evaluate patient this am.

## 2022-07-23 LAB — T3: T3, Total: 121 ng/dL (ref 71–180)

## 2022-07-30 ENCOUNTER — Encounter: Payer: Medicaid - Out of State | Admitting: Student

## 2023-03-26 ENCOUNTER — Inpatient Hospital Stay (HOSPITAL_COMMUNITY)
Admission: EM | Admit: 2023-03-26 | Discharge: 2023-03-27 | DRG: 392 | Discharge status: Against Medical Advice | Payer: Self-pay | Attending: Family Medicine | Admitting: Family Medicine

## 2023-03-26 ENCOUNTER — Encounter (HOSPITAL_COMMUNITY): Payer: Self-pay

## 2023-03-26 ENCOUNTER — Emergency Department (HOSPITAL_COMMUNITY): Payer: Self-pay

## 2023-03-26 ENCOUNTER — Other Ambulatory Visit: Payer: Self-pay

## 2023-03-26 DIAGNOSIS — K529 Noninfective gastroenteritis and colitis, unspecified: Principal | ICD-10-CM

## 2023-03-26 DIAGNOSIS — Z87891 Personal history of nicotine dependence: Secondary | ICD-10-CM

## 2023-03-26 DIAGNOSIS — Z79899 Other long term (current) drug therapy: Secondary | ICD-10-CM

## 2023-03-26 DIAGNOSIS — E876 Hypokalemia: Secondary | ICD-10-CM | POA: Diagnosis present

## 2023-03-26 DIAGNOSIS — R112 Nausea with vomiting, unspecified: Principal | ICD-10-CM | POA: Diagnosis present

## 2023-03-26 DIAGNOSIS — F1210 Cannabis abuse, uncomplicated: Secondary | ICD-10-CM | POA: Diagnosis present

## 2023-03-26 DIAGNOSIS — Z5329 Procedure and treatment not carried out because of patient's decision for other reasons: Secondary | ICD-10-CM | POA: Diagnosis present

## 2023-03-26 DIAGNOSIS — J45909 Unspecified asthma, uncomplicated: Secondary | ICD-10-CM | POA: Diagnosis present

## 2023-03-26 LAB — COMPREHENSIVE METABOLIC PANEL
ALT: 22 U/L (ref 0–44)
AST: 20 U/L (ref 15–41)
Albumin: 4.8 g/dL (ref 3.5–5.0)
Alkaline Phosphatase: 67 U/L (ref 38–126)
Anion gap: 10 (ref 5–15)
BUN: 8 mg/dL (ref 6–20)
CO2: 26 mmol/L (ref 22–32)
Calcium: 9.6 mg/dL (ref 8.9–10.3)
Chloride: 99 mmol/L (ref 98–111)
Creatinine, Ser: 0.81 mg/dL (ref 0.44–1.00)
GFR, Estimated: >60 mL/min (ref >60)
Glucose, Bld: 97 mg/dL (ref 70–99)
Potassium: 3.9 mmol/L (ref 3.5–5.1)
Sodium: 135 mmol/L (ref 135–145)
Total Bilirubin: 1 mg/dL (ref 0.0–1.2)
Total Protein: 8.4 g/dL — ABNORMAL HIGH (ref 6.5–8.1)

## 2023-03-26 LAB — CBC
HCT: 48.3 % — ABNORMAL HIGH (ref 36.0–46.0)
Hemoglobin: 15.5 g/dL — ABNORMAL HIGH (ref 12.0–15.0)
MCH: 31 pg (ref 26.0–34.0)
MCHC: 32.1 g/dL (ref 30.0–36.0)
MCV: 96.6 fL (ref 80.0–100.0)
Platelets: 274 10*3/uL (ref 150–400)
RBC: 5 MIL/uL (ref 3.87–5.11)
RDW: 13.2 % (ref 11.5–15.5)
WBC: 4.3 10*3/uL (ref 4.0–10.5)
nRBC: 0 % (ref 0.0–0.2)

## 2023-03-26 LAB — URINALYSIS, ROUTINE W REFLEX MICROSCOPIC
Bilirubin Urine: NEGATIVE
Glucose, UA: NEGATIVE mg/dL
Hgb urine dipstick: NEGATIVE
Ketones, ur: 20 mg/dL — AB
Leukocytes,Ua: NEGATIVE
Nitrite: NEGATIVE
Protein, ur: NEGATIVE mg/dL
Specific Gravity, Urine: 1.023 (ref 1.005–1.030)
pH: 7 (ref 5.0–8.0)

## 2023-03-26 LAB — RAPID URINE DRUG SCREEN, HOSP PERFORMED
Amphetamines: NOT DETECTED
Barbiturates: NOT DETECTED
Benzodiazepines: NOT DETECTED
Cocaine: NOT DETECTED
Opiates: NOT DETECTED
Tetrahydrocannabinol: POSITIVE — AB

## 2023-03-26 LAB — LIPASE, BLOOD: Lipase: 25 U/L (ref 11–51)

## 2023-03-26 LAB — HCG, SERUM, QUALITATIVE: Preg, Serum: NEGATIVE

## 2023-03-26 MED ORDER — ACETAMINOPHEN 650 MG RE SUPP: 650.0000 mg | Freq: Four times a day (QID) | RECTAL | Status: DC | PRN

## 2023-03-26 MED ORDER — POLYETHYLENE GLYCOL 3350 17 G PO PACK: 17.0000 g | PACK | Freq: Every day | ORAL | Status: DC | PRN

## 2023-03-26 MED ORDER — IOHEXOL 300 MG/ML  SOLN
100.0000 mL | Freq: Once | INTRAMUSCULAR | Status: AC | PRN
  Administered 2023-03-26: 100 mL via INTRAVENOUS

## 2023-03-26 MED ORDER — KETOROLAC TROMETHAMINE 15 MG/ML IJ SOLN
15.0000 mg | Freq: Four times a day (QID) | INTRAMUSCULAR | Status: AC | PRN
  Administered 2023-03-26 – 2023-03-27 (×2): 15 mg via INTRAVENOUS
  Filled 2023-03-26 (×2): qty 1

## 2023-03-26 MED ORDER — ONDANSETRON HCL 4 MG/2ML IJ SOLN
4.0000 mg | Freq: Once | INTRAMUSCULAR | Status: AC
Start: 2023-03-26 — End: 2023-03-26
  Administered 2023-03-26: 4 mg via INTRAVENOUS
  Filled 2023-03-26: qty 2

## 2023-03-26 MED ORDER — DIPHENHYDRAMINE HCL 50 MG/ML IJ SOLN
12.5000 mg | Freq: Once | INTRAMUSCULAR | Status: AC
  Administered 2023-03-26: 12.5 mg via INTRAVENOUS
  Filled 2023-03-26: qty 1

## 2023-03-26 MED ORDER — ONDANSETRON HCL 4 MG/2ML IJ SOLN
4.0000 mg | Freq: Four times a day (QID) | INTRAMUSCULAR | Status: DC | PRN
  Administered 2023-03-26 – 2023-03-27 (×3): 4 mg via INTRAVENOUS
  Filled 2023-03-26 (×3): qty 2

## 2023-03-26 MED ORDER — MORPHINE SULFATE (PF) 4 MG/ML IV SOLN
4.0000 mg | Freq: Once | INTRAVENOUS | Status: AC
  Administered 2023-03-26: 4 mg via INTRAVENOUS
  Filled 2023-03-26: qty 1

## 2023-03-26 MED ORDER — PROCHLORPERAZINE EDISYLATE 10 MG/2ML IJ SOLN: 10.0000 mg | Freq: Four times a day (QID) | INTRAMUSCULAR | Status: DC | PRN

## 2023-03-26 MED ORDER — LACTATED RINGERS IV SOLN: INTRAVENOUS | Status: AC

## 2023-03-26 MED ORDER — METOCLOPRAMIDE HCL 5 MG/ML IJ SOLN
10.0000 mg | Freq: Once | INTRAMUSCULAR | Status: AC
  Administered 2023-03-26: 10 mg via INTRAVENOUS
  Filled 2023-03-26: qty 2

## 2023-03-26 MED ORDER — ENOXAPARIN SODIUM 40 MG/0.4ML IJ SOSY
40.0000 mg | PREFILLED_SYRINGE | INTRAMUSCULAR | Status: DC
  Administered 2023-03-27: 40 mg via SUBCUTANEOUS
  Filled 2023-03-26: qty 0.4

## 2023-03-26 MED ORDER — LACTATED RINGERS IV BOLUS
2000.0000 mL | Freq: Once | INTRAVENOUS | Status: AC
Start: 2023-03-26 — End: 2023-03-26
  Administered 2023-03-26: 2000 mL via INTRAVENOUS

## 2023-03-26 MED ORDER — PANTOPRAZOLE SODIUM 40 MG IV SOLR
40.0000 mg | INTRAVENOUS | Status: DC
  Administered 2023-03-26: 40 mg via INTRAVENOUS
  Filled 2023-03-26: qty 10

## 2023-03-26 MED ORDER — ACETAMINOPHEN 325 MG PO TABS: 650.0000 mg | ORAL_TABLET | Freq: Four times a day (QID) | ORAL | Status: DC | PRN

## 2023-03-26 MED ORDER — SODIUM CHLORIDE 0.9% FLUSH
3.0000 mL | Freq: Two times a day (BID) | INTRAVENOUS | Status: DC
  Administered 2023-03-27: 3 mL via INTRAVENOUS

## 2023-03-26 MED ORDER — SODIUM CHLORIDE 0.9 % IV SOLN
12.5000 mg | Freq: Once | INTRAVENOUS | Status: AC
  Administered 2023-03-26: 12.5 mg via INTRAVENOUS
  Filled 2023-03-26: qty 12.5

## 2023-03-26 MED ORDER — DROPERIDOL 2.5 MG/ML IJ SOLN
1.2500 mg | Freq: Once | INTRAMUSCULAR | Status: AC
  Administered 2023-03-26: 1.25 mg via INTRAVENOUS
  Filled 2023-03-26: qty 2

## 2023-03-26 NOTE — H&P (Signed)
 History and Physical    Patient: Krista Stout EAV:[id number] DOB: 12/29/1990 DOA: 03/26/2023 DOS: the patient was seen and examined on 03/26/2023 PCP: Pcp, No  Patient coming from: Home  Chief Complaint:  Chief Complaint  Patient presents with   Vomiting   Abdominal Pain   HPI: Krista Stout is a 33 y.o. female with medical history significant of medical issues as listed below including asthma cyclical vomiting syndrome and abuse of cannabis by smoking.  Patient reports being in her usual state of health till approximately 4 days ago when she had a new onset of vomiting about 3 episodes a day and any attempt to tolerate p.o. diet or fluids was unsuccessful.  This was associated with continued diarrhea last 2 days and today.  As well as about 3 loose bowel movements a day before yesterday and yesterday each.  Resolution of diarrhea today.  Patient denies any fever.  Generalized mild abdominal discomfort.  No dysuria.  No trauma.  Patient has had no bloody emesis or bloody diarrhea.  Diarrhea has resolved today.  Mulitple episodes in the past.  Patient presented to the ER today with complaints as above, attempts to have the patient eat and drink, failed.  Medical evaluation is sought.  At this time patient is not actively vomiting, no further complaints. Review of Systems: As mentioned in the history of present illness. All other systems reviewed and are negative. Past Medical History:  Diagnosis Date   Asthma    Cyclical vomiting syndrome    History of feeding tube 2021   [facility] in [place]   Past Surgical History:  Procedure Laterality Date   INDUCED ABORTION  03/12/2022   LAPAROSCOPIC OOPHERECTOMY Right 2022   In [place]. Patient states fibroid had right ovary encased and both were removed; unsure if tube was removed, too   UMBILICAL HERNIA REPAIR     w/ mesh   Social History:  reports that she has quit smoking. She has never used smokeless tobacco. She reports  current drug use. Drug: Marijuana. She reports that she does not drink alcohol.  No Known Allergies  History reviewed. No pertinent family history.  Prior to Admission medications   Medication Sig Start Date End Date Taking? Authorizing Provider  promethazine (PHENERGAN) 25 MG suppository Place 1 suppository (25 mg total) rectally every 6 (six) hours as needed for nausea or vomiting. 07/11/22   Garlon Hatchet, PA-C    Physical Exam: Vitals:   03/26/23 1508 03/26/23 1600 03/26/23 1700 03/26/23 1730  BP:  99/73 113/80 (!) 120/93  Pulse:  (!) 59 60   Resp:  16 14 15   Temp: 98.5 F (36.9 C)  98.3 F (36.8 C)   TempSrc: Oral  Oral   SpO2:  100% 98% 98%  Weight:      Height:       General: Patient is alert and awake appears to be in no distress, working on her phone Respiratory exam: Bilateral intravesicular Cardiovascular exam S1-S2 normal Abdomen bowel sounds normal all quadrants are diffusely tender without any guarding or rebound Extremities warm without edema.  Patient looking nontoxic. Data Reviewed:  Labs on Admission:  Results for orders placed or performed during the hospital encounter of 03/26/23 (from the past 24 hours)  Lipase, blood     Status: None   Collection Time: 03/26/23 11:01 AM  Result Value Ref Range   Lipase 25 11 - 51 U/L  Comprehensive metabolic panel     Status: Abnormal  Collection Time: 03/26/23 11:01 AM  Result Value Ref Range   Sodium 135 135 - 145 mmol/L   Potassium 3.9 3.5 - 5.1 mmol/L   Chloride 99 98 - 111 mmol/L   CO2 26 22 - 32 mmol/L   Glucose, Bld 97 70 - 99 mg/dL   BUN 8 6 - 20 mg/dL   Creatinine, Ser 2.13 0.44 - 1.00 mg/dL   Calcium 9.6 8.9 - 08.6 mg/dL   Total Protein 8.4 (H) 6.5 - 8.1 g/dL   Albumin 4.8 3.5 - 5.0 g/dL   AST 20 15 - 41 U/L   ALT 22 0 - 44 U/L   Alkaline Phosphatase 67 38 - 126 U/L   Total Bilirubin 1.0 0.0 - 1.2 mg/dL   GFR, Estimated >57 >84 mL/min   Anion gap 10 5 - 15  CBC     Status: Abnormal    Collection Time: 03/26/23 11:01 AM  Result Value Ref Range   WBC 4.3 4.0 - 10.5 K/uL   RBC 5.00 3.87 - 5.11 MIL/uL   Hemoglobin 15.5 (H) 12.0 - 15.0 g/dL   HCT 69.6 (H) 29.5 - 28.4 %   MCV 96.6 80.0 - 100.0 fL   MCH 31.0 26.0 - 34.0 pg   MCHC 32.1 30.0 - 36.0 g/dL   RDW 13.2 44.0 - 10.2 %   Platelets 274 150 - 400 K/uL   nRBC 0.0 0.0 - 0.2 %  hCG, serum, qualitative     Status: None   Collection Time: 03/26/23 11:01 AM  Result Value Ref Range   Preg, Serum NEGATIVE NEGATIVE  Urinalysis, Routine w reflex microscopic -Urine, Clean Catch     Status: Abnormal   Collection Time: 03/26/23 11:27 AM  Result Value Ref Range   Color, Urine YELLOW YELLOW   APPearance CLEAR CLEAR   Specific Gravity, Urine 1.023 1.005 - 1.030   pH 7.0 5.0 - 8.0   Glucose, UA NEGATIVE NEGATIVE mg/dL   Hgb urine dipstick NEGATIVE NEGATIVE   Bilirubin Urine NEGATIVE NEGATIVE   Ketones, ur 20 (A) NEGATIVE mg/dL   Protein, ur NEGATIVE NEGATIVE mg/dL   Nitrite NEGATIVE NEGATIVE   Leukocytes,Ua NEGATIVE NEGATIVE  Rapid urine drug screen (hospital performed)     Status: Abnormal   Collection Time: 03/26/23 11:27 AM  Result Value Ref Range   Opiates NONE DETECTED NONE DETECTED   Cocaine NONE DETECTED NONE DETECTED   Benzodiazepines NONE DETECTED NONE DETECTED   Amphetamines NONE DETECTED NONE DETECTED   Tetrahydrocannabinol POSITIVE (A) NONE DETECTED   Barbiturates NONE DETECTED NONE DETECTED   Basic Metabolic Panel: Recent Labs  Lab 03/26/23 1101  NA 135  K 3.9  CL 99  CO2 26  GLUCOSE 97  BUN 8  CREATININE 0.81  CALCIUM 9.6   Liver Function Tests: Recent Labs  Lab 03/26/23 1101  AST 20  ALT 22  ALKPHOS 67  BILITOT 1.0  PROT 8.4*  ALBUMIN 4.8   Recent Labs  Lab 03/26/23 1101  LIPASE 25   No results for input(s): "AMMONIA" in the last 168 hours. CBC: Recent Labs  Lab 03/26/23 1101  WBC 4.3  HGB 15.5*  HCT 48.3*  MCV 96.6  PLT 274   Cardiac Enzymes: No results for input(s):  "CKTOTAL", "CKMB", "CKMBINDEX", "TROPONINIHS" in the last 168 hours.  BNP (last 3 results) No results for input(s): "PROBNP" in the last 8760 hours. CBG: No results for input(s): "GLUCAP" in the last 168 hours.  Radiological Exams on Admission:  CT  ABDOMEN PELVIS W CONTRAST Result Date: 03/26/2023 CLINICAL DATA:  Acute, non localized abdominal pain EXAM: CT ABDOMEN AND PELVIS WITH CONTRAST TECHNIQUE: Multidetector CT imaging of the abdomen and pelvis was performed using the standard protocol following bolus administration of intravenous contrast. RADIATION DOSE REDUCTION: This exam was performed according to the departmental dose-optimization program which includes automated exposure control, adjustment of the mA and/or kV according to patient size and/or use of iterative reconstruction technique. CONTRAST:  OMNIPAQUE IOHEXOL 300 MG/ML  SOLN COMPARISON:  Prior CT scan of the abdomen and pelvis dated 07/11/2022 FINDINGS: Lower chest: No acute abnormality. Hepatobiliary: No focal liver abnormality is seen. No gallstones, gallbladder wall thickening, or biliary dilatation. Pancreas: Unremarkable. No pancreatic ductal dilatation or surrounding inflammatory changes. Spleen: No splenic injury or perisplenic hematoma. Adrenals/Urinary Tract: Adrenal glands are unremarkable. Kidneys are normal, without renal calculi, focal lesion, or hydronephrosis. Bladder is unremarkable. Stomach/Bowel: No evidence of obstruction or focal bowel wall thickening. Normal appendix in the right lower quadrant. The terminal ileum is unremarkable. Vascular/Lymphatic: No significant vascular findings are present. No enlarged abdominal or pelvic lymph nodes. Reproductive: Uterus and bilateral adnexa are unremarkable. Other: No abdominal wall hernia or abnormality. No abdominopelvic ascites. Musculoskeletal: No acute fracture or aggressive appearing lytic or blastic osseous lesion. IMPRESSION: No acute abnormality within the abdomen  or pelvis. Electronically Signed   By: Malachy Moan M.D.   On: 03/26/2023 16:18    chest X-ray  EKG: Independently reviewed. NSR  No intake/output data recorded. No intake/output data recorded.     Assessment and Plan: Gastroenteritis See HPI.  Patient noted to have slight hemoconcentration, status post 2 L of fluid in the ER.  Given that currently diarrhea has resolved, I will not pursue stool studies.  Patient's major residual complaint is nausea vomiting and inability to tolerate p.o. diet.  Her abdominal pain is mild.  We will treat with acetaminophen as needed.  IV fluids.  Continue with clinical monitoring.  Introduce diet slowly as able.    Patient advised to quit marijuana.  I will treat with empiric pantoprazole and check for H. pylori   HCG in urine negative    Advance Care Planning:   Code Status: Prior full code.  Consults: none    Severity of Illness: The appropriate patient status for this patient is OBSERVATION. Observation status is judged to be reasonable and necessary in order to provide the required intensity of service to ensure the patient's safety. The patient's presenting symptoms, physical exam findings, and initial radiographic and laboratory data in the context of their medical condition is felt to place them at decreased risk for further clinical deterioration. Furthermore, it is anticipated that the patient will be medically stable for discharge from the hospital within 2 midnights of admission.   Author: Nolberto Hanlon, MD 03/26/2023 6:33 PM  For on call review [email or web address].

## 2023-03-26 NOTE — Plan of Care (Signed)
 Plan of care reviewed and discussed.  Denies questions at this time.

## 2023-03-26 NOTE — Assessment & Plan Note (Signed)
 See HPI.  Patient noted to have slight hemoconcentration, status post 2 L of fluid in the ER.  Given that currently diarrhea has resolved, I will not pursue stool studies.  Patient's major residual complaint is nausea vomiting and inability to tolerate p.o. diet.  Her abdominal pain is mild.  We will treat with acetaminophen as needed.  IV fluids.  Continue with clinical monitoring.  Introduce diet slowly as able.    Patient advised to quit marijuana.  I will treat with empiric pantoprazole and check for H. pylori

## 2023-03-26 NOTE — ED Notes (Signed)
Called lab added on urine drug screen

## 2023-03-26 NOTE — ED Notes (Signed)
 Per pt, pt does not want her husband Arlys John) to visit her "under any circumstances", does not want any visitors at all.

## 2023-03-26 NOTE — ED Notes (Signed)
 Patient transported to CT

## 2023-03-26 NOTE — ED Provider Notes (Signed)
 [place] EMERGENCY DEPARTMENT AT [facility] Provider Note   CSN: [id number] Arrival date & time: 03/26/23  1049     History  Chief Complaint  Patient presents with   Vomiting   Abdominal Pain    Krista Stout is a 33 y.o. female.  33 year old female presents today for concern of abdominal pain, nausea, vomiting for the past 4 days.  Endorses inability to tolerate any p.o. intake.  She denies any hematemesis, blood in stool.  Denies any urinary complaints.  She states in the past month and a half she has not used any marijuana.  She states she has history of similar presentations in the past several years.   Abdominal Pain Associated symptoms: nausea and vomiting   Associated symptoms: no chest pain, no dysuria, no fever and no shortness of breath        Home Medications Prior to Admission medications   Medication Sig Start Date End Date Taking? Authorizing Provider  promethazine (PHENERGAN) 25 MG suppository Place 1 suppository (25 mg total) rectally every 6 (six) hours as needed for nausea or vomiting. 07/11/22   Garlon Hatchet, PA-C      Allergies    Patient has no known allergies.    Review of Systems   Review of Systems  Constitutional:  Negative for fever.  Respiratory:  Negative for shortness of breath.   Cardiovascular:  Negative for chest pain.  Gastrointestinal:  Positive for abdominal pain, nausea and vomiting.  Genitourinary:  Negative for dysuria.  Neurological:  Negative for light-headedness.  All other systems reviewed and are negative.   Physical Exam Updated Vital Signs BP (!) 138/98   Pulse 83   Temp 98.6 F (37 C) (Oral)   Resp 18   Ht 5\' 2"  (1.575 m)   Wt 60.2 kg   SpO2 100%   BMI 24.27 kg/m  Physical Exam Vitals and nursing note reviewed.  Constitutional:      General: She is not in acute distress.    Appearance: Normal appearance. She is not ill-appearing.  HENT:     Head: Normocephalic and atraumatic.     Nose:  Nose normal.  Eyes:     General: No scleral icterus.    Extraocular Movements: Extraocular movements intact.     Conjunctiva/sclera: Conjunctivae normal.  Cardiovascular:     Rate and Rhythm: Normal rate and regular rhythm.  Pulmonary:     Effort: Pulmonary effort is normal. No respiratory distress.     Breath sounds: Normal breath sounds. No wheezing or rales.  Abdominal:     General: There is no distension.     Palpations: Abdomen is soft.     Tenderness: There is abdominal tenderness. There is no right CVA tenderness, left CVA tenderness or guarding.  Musculoskeletal:        General: Normal range of motion.     Cervical back: Normal range of motion.  Skin:    General: Skin is warm and dry.  Neurological:     General: No focal deficit present.     Mental Status: She is alert. Mental status is at baseline.     ED Results / Procedures / Treatments   Labs (all labs ordered are listed, but only abnormal results are displayed) Labs Reviewed  COMPREHENSIVE METABOLIC PANEL - Abnormal; Notable for the following components:      Result Value   Total Protein 8.4 (*)    All other components within normal limits  CBC - Abnormal;  Notable for the following components:   Hemoglobin 15.5 (*)    HCT 48.3 (*)    All other components within normal limits  LIPASE, BLOOD  URINALYSIS, ROUTINE W REFLEX MICROSCOPIC  HCG, SERUM, QUALITATIVE  RAPID URINE DRUG SCREEN, HOSP PERFORMED    EKG None  Radiology No results found.  Procedures Procedures    Medications Ordered in ED Medications  lactated ringers bolus 2,000 mL (has no administration in time range)  morphine (PF) 4 MG/ML injection 4 mg (has no administration in time range)  ondansetron (ZOFRAN) injection 4 mg (has no administration in time range)  droperidol (INAPSINE) 2.5 MG/ML injection 1.25 mg (has no administration in time range)  diphenhydrAMINE (BENADRYL) injection 12.5 mg (has no administration in time range)    ED  Course/ Medical Decision Making/ A&P                                 Medical Decision Making Amount and/or Complexity of Data Reviewed Labs: ordered. Radiology: ordered.  Risk Prescription drug management.    Medical Decision Making / ED Course   This patient presents to the ED for concern of abdominal pain, nausea, vomiting, this involves an extensive number of treatment options, and is a complaint that carries with it a high risk of complications and morbidity.  The differential diagnosis includes gastroenteritis, cannabinoid induced hyperemesis syndrome, diverticulitis, pancreatitis, UTI, pyelonephritis  MDM: 33 year old female presents today for concern of abdominal pain, nausea, vomiting ongoing for the past 4 days.  Endorses no p.o. intake over this timeframe.  She states she is unable to tolerate ice chips either.  No hematemesis.  Hemodynamically overall stable.  Generalized abdominal tenderness.  Denies use marijuana in the past 1.5 months according to her.  CBC without leukocytosis.  No anemia.  CMP shows no acute concern.  Lipase within normal.  Will add on UDS.  UA pending.  CT abdomen pelvis with contrast ordered.  Will give dose of Zofran, morphine, 2 L fluid bolus, droperidol and Benadryl and reevaluate.  Patient after going to the CT scanner had an episode of emesis.  Ordered Phenergan.  Patient at the end of my shift is awaiting CT scan.  Signed out to oncoming provider.   Additional history obtained: -Additional history obtained from previous ED visits. -External records from outside source obtained and reviewed including: Chart review including previous notes, labs, imaging, consultation notes   Lab Tests: -I ordered, reviewed, and interpreted labs.   The pertinent results include:   Labs Reviewed  COMPREHENSIVE METABOLIC PANEL - Abnormal; Notable for the following components:      Result Value   Total Protein 8.4 (*)    All other components within normal  limits  CBC - Abnormal; Notable for the following components:   Hemoglobin 15.5 (*)    HCT 48.3 (*)    All other components within normal limits  URINALYSIS, ROUTINE W REFLEX MICROSCOPIC - Abnormal; Notable for the following components:   Ketones, ur 20 (*)    All other components within normal limits  RAPID URINE DRUG SCREEN, HOSP PERFORMED - Abnormal; Notable for the following components:   Tetrahydrocannabinol POSITIVE (*)    All other components within normal limits  LIPASE, BLOOD  HCG, SERUM, QUALITATIVE      EKG  EKG Interpretation Date/Time:    Ventricular Rate:    PR Interval:    QRS Duration:    QT Interval:  QTC Calculation:   R Axis:      Text Interpretation:           Imaging Studies ordered: I ordered imaging studies including CT abdomen pelvis with IV contrast I independently visualized and interpreted imaging. I agree with the radiologist interpretation   Medicines ordered and prescription drug management: Meds ordered this encounter  Medications   lactated ringers bolus 2,000 mL   morphine (PF) 4 MG/ML injection 4 mg   ondansetron (ZOFRAN) injection 4 mg   droperidol (INAPSINE) 2.5 MG/ML injection 1.25 mg   diphenhydrAMINE (BENADRYL) injection 12.5 mg   iohexol (OMNIPAQUE) 300 MG/ML solution 100 mL   promethazine (PHENERGAN) 12.5 mg in sodium chloride 0.9 % 50 mL IVPB    -I have reviewed the patients home medicines and have made adjustments as needed   Cardiac Monitoring: The patient was maintained on a cardiac monitor.  I personally viewed and interpreted the cardiac monitored which showed an underlying rhythm of: Normal sinus rhythm  Reevaluation: After the interventions noted above, I reevaluated the patient and found that they have :stayed the same  Co morbidities that complicate the patient evaluation  Past Medical History:  Diagnosis Date   Asthma    Cyclical vomiting syndrome    History of feeding tube 2021   [facility] hospital in  [place]      Dispostion: Signout to oncoming provider pending CT.   Final Clinical Impression(s) / ED Diagnoses Final diagnoses:  Nausea and vomiting, unspecified vomiting type    Rx / DC Orders ED Discharge Orders     None         Marita Kansas, PA-C 03/26/23 1503    Rexford Maus, DO 03/26/23 1531

## 2023-03-26 NOTE — ED Provider Notes (Signed)
  Physical Exam  BP 99/73   Pulse (!) 59   Temp 98.5 F (36.9 C) (Oral)   Resp 16   Ht 5\' 2"  (1.575 m)   Wt 60.2 kg   SpO2 100%   BMI 24.27 kg/m   Physical Exam Vitals and nursing note reviewed.  Constitutional:      General: She is not in acute distress.    Appearance: She is well-developed.  HENT:     Head: Normocephalic and atraumatic.  Eyes:     Conjunctiva/sclera: Conjunctivae normal.  Cardiovascular:     Rate and Rhythm: Normal rate and regular rhythm.     Heart sounds: No murmur heard. Pulmonary:     Effort: Pulmonary effort is normal. No respiratory distress.     Breath sounds: Normal breath sounds.  Abdominal:     Palpations: Abdomen is soft.     Tenderness: There is abdominal tenderness.  Musculoskeletal:        General: No swelling.     Cervical back: Neck supple.  Skin:    General: Skin is warm and dry.     Capillary Refill: Capillary refill takes less than 2 seconds.  Neurological:     Mental Status: She is alert.  Psychiatric:        Mood and Affect: Mood normal.     Procedures  Procedures  ED Course / MDM   Clinical Course as of 03/26/23 1623  Wed Mar 26, 2023  1507 Admitted for [facility], June of last year. Believes this is similar. Used marijuana 1.5 months ago. Getting phenergan. Symptom control, follow up CT, DC or admit. [CG]    Clinical Course User Index [CG] Al Decant, PA-C   Medical Decision Making Amount and/or Complexity of Data Reviewed Labs: ordered. Radiology: ordered.  Risk Prescription drug management. Decision regarding hospitalization.   Patient signed out to me at shift change.  Please see the previous provider note for further details.  In short, 33 year old female with history of CHS presents for evaluation of nausea vomiting abdominal pain.  States has not used marijuana in the last 1 and half months.  Urine drug screen positive for THC.  Patient signed out to me pending CT scan of her abdomen as well as  reevaluation.  CT scan of her abdomen and pelvis unremarkable.  No acute process identified.  Patient continues to have nausea and vomiting.  Will provide Reglan, reassess.  Patient continues to have nausea and vomiting, will admit for intractable nausea and vomiting in the setting of CHS.  Update: Reassessed, patient continues to complain of pain, nausea and vomiting.  Patient has received Reglan from myself as well as 4 mg of morphine to total 8 mg morphine.  EKG was collected which shows a prolonged QT so we will admit the patient at this time for intractable nausea and vomiting.  Patient discussed with Dr. Maryjean Ka of the [facility].  He has agreed to admit the patient for further management and care of intractable nausea and vomiting.  Stable at admission.     Al Decant, PA-C 03/26/23 1744    Charlynne Pander, MD 03/26/23 [phone]

## 2023-03-27 ENCOUNTER — Encounter (HOSPITAL_COMMUNITY): Payer: Self-pay

## 2023-03-27 ENCOUNTER — Emergency Department (HOSPITAL_COMMUNITY)
Admission: EM | Admit: 2023-03-27 | Discharge: 2023-03-28 | Disposition: A | Discharge status: Home / Self Care | Payer: Self-pay

## 2023-03-27 DIAGNOSIS — R112 Nausea with vomiting, unspecified: Secondary | ICD-10-CM | POA: Insufficient documentation

## 2023-03-27 DIAGNOSIS — R1084 Generalized abdominal pain: Secondary | ICD-10-CM | POA: Insufficient documentation

## 2023-03-27 LAB — BASIC METABOLIC PANEL
Anion gap: 10 (ref 5–15)
BUN: 9 mg/dL (ref 6–20)
CO2: 24 mmol/L (ref 22–32)
Calcium: 8.6 mg/dL — ABNORMAL LOW (ref 8.9–10.3)
Chloride: 103 mmol/L (ref 98–111)
Creatinine, Ser: 0.82 mg/dL (ref 0.44–1.00)
GFR, Estimated: >60 mL/min (ref >60)
Glucose, Bld: 87 mg/dL (ref 70–99)
Potassium: 3.3 mmol/L — ABNORMAL LOW (ref 3.5–5.1)
Sodium: 137 mmol/L (ref 135–145)

## 2023-03-27 LAB — CBC
HCT: 40.7 % (ref 36.0–46.0)
Hemoglobin: 13.2 g/dL (ref 12.0–15.0)
MCH: 31.4 pg (ref 26.0–34.0)
MCHC: 32.4 g/dL (ref 30.0–36.0)
MCV: 96.9 fL (ref 80.0–100.0)
Platelets: 216 10*3/uL (ref 150–400)
RBC: 4.2 MIL/uL (ref 3.87–5.11)
RDW: 13.1 % (ref 11.5–15.5)
WBC: 4.2 10*3/uL (ref 4.0–10.5)
nRBC: 0 % (ref 0.0–0.2)

## 2023-03-27 LAB — APTT: aPTT: 26 s (ref 24–36)

## 2023-03-27 LAB — PROTIME-INR
INR: 1 (ref 0.8–1.2)
Prothrombin Time: 13.7 s (ref 11.4–15.2)

## 2023-03-27 MED ORDER — DIPHENHYDRAMINE HCL 50 MG/ML IJ SOLN
12.5000 mg | Freq: Once | INTRAMUSCULAR | Status: AC
  Administered 2023-03-27: 12.5 mg via INTRAVENOUS
  Filled 2023-03-27: qty 1

## 2023-03-27 MED ORDER — MORPHINE SULFATE (PF) 2 MG/ML IV SOLN
2.0000 mg | INTRAVENOUS | Status: DC | PRN
  Administered 2023-03-27 (×2): 2 mg via INTRAVENOUS
  Filled 2023-03-27 (×2): qty 1

## 2023-03-27 MED ORDER — DIPHENHYDRAMINE HCL 50 MG/ML IJ SOLN
12.5000 mg | Freq: Once | INTRAMUSCULAR | Status: AC
Start: 2023-03-27 — End: 2023-03-27
  Administered 2023-03-27: 12.5 mg via INTRAVENOUS
  Filled 2023-03-27: qty 1

## 2023-03-27 MED ORDER — METOCLOPRAMIDE HCL 5 MG/ML IJ SOLN
10.0000 mg | Freq: Four times a day (QID) | INTRAMUSCULAR | Status: DC
  Administered 2023-03-27 (×2): 10 mg via INTRAVENOUS
  Filled 2023-03-27 (×2): qty 2

## 2023-03-27 MED ORDER — PANTOPRAZOLE SODIUM 40 MG IV SOLR
40.0000 mg | Freq: Two times a day (BID) | INTRAVENOUS | Status: DC
  Administered 2023-03-27: 40 mg via INTRAVENOUS
  Filled 2023-03-27: qty 10

## 2023-03-27 MED ORDER — POTASSIUM CHLORIDE 10 MEQ/100ML IV SOLN
10.0000 meq | Freq: Once | INTRAVENOUS | Status: AC
Start: 2023-03-27 — End: 2023-03-27
  Administered 2023-03-27: 10 meq via INTRAVENOUS
  Filled 2023-03-27: qty 100

## 2023-03-27 MED ORDER — LACTATED RINGERS IV SOLN: INTRAVENOUS | Status: DC

## 2023-03-27 MED ORDER — MORPHINE SULFATE (PF) 2 MG/ML IV SOLN
2.0000 mg | Freq: Once | INTRAVENOUS | Status: AC
  Administered 2023-03-27: 2 mg via INTRAVENOUS
  Filled 2023-03-27: qty 1

## 2023-03-27 NOTE — Progress Notes (Signed)
 [facility]  PROGRESS NOTE  LORALIE MALTA WUJ:[id number] DOB: 12/13/1990 DOA: 03/26/2023 PCP: Pcp, No   Brief HPI:   33 y.o. female with medical history significant of medical issues as listed below including asthma cyclical vomiting syndrome and abuse of cannabis by smoking.   Patient reports being in her usual state of health till approximately 4 days ago when she had a new onset of vomiting about 3 episodes a day and any attempt to tolerate p.o. diet or fluids was unsuccessful.  This was associated with continued diarrhea last 2 days and today.  As well as about 3 loose bowel movements a day before yesterday and yesterday each.  Resolution of diarrhea today.  Patient denies any fever.  Generalized mild abdominal discomfort.  No dysuria.  No trauma.  Patient has had no bloody emesis or bloody diarrhea.  Diarrhea has resolved today.      Assessment/Plan:   Gastroenteritis -CT abdomen/pelvis was unremarkable -Urine drug screen positive for THC -?Cannabis Cyclic Vomiting Syndrome Continue Protonix, Zofran  Patient advised to quit marijuana.   Hypokalemia -Potassium is 3.3.  Will replace potassium and follow BMP in am  Epigastric pain -Likely in setting of above -Start morphine 2 mg IV every 4 hours as needed  Medications     enoxaparin (LOVENOX) injection  40 mg Subcutaneous Q24H   pantoprazole (PROTONIX) IV  40 mg Intravenous Q24H   sodium chloride flush  3 mL Intravenous Q12H     Data Reviewed:   CBG:  No results for input(s): "GLUCAP" in the last 168 hours.  SpO2: 93 %    Vitals:   03/26/23 1730 03/26/23 2025 03/27/23 0120 03/27/23 0445  BP: (!) 120/93 (!) 140/92 132/78 102/63  Pulse:  (!) 59 (!) 54 67  Resp: 15 16 18 18   Temp:  98.5 F (36.9 C) 97.8 F (36.6 C) 98 F (36.7 C)  TempSrc:   Oral Oral  SpO2: 98% 100% 100% 93%  Weight:      Height:          Data Reviewed:  Basic Metabolic Panel: Recent Labs  Lab 03/26/23 1101 03/27/23 0612   NA 135 137  K 3.9 3.3*  CL 99 103  CO2 26 24  GLUCOSE 97 87  BUN 8 9  CREATININE 0.81 0.82  CALCIUM 9.6 8.6*    CBC: Recent Labs  Lab 03/26/23 1101 03/27/23 0832  WBC 4.3 4.2  HGB 15.5* 13.2  HCT 48.3* 40.7  MCV 96.6 96.9  PLT 274 216    LFT Recent Labs  Lab 03/26/23 1101  AST 20  ALT 22  ALKPHOS 67  BILITOT 1.0  PROT 8.4*  ALBUMIN 4.8     Antibiotics: Anti-infectives (From admission, onward)    None        DVT prophylaxis: Lovenox  Code Status: Full code  Family Communication: No family at bedside   CONSULTS    Subjective   Continues to complain of nausea.   Objective    Physical Examination:   General-appears in no acute distress Heart-S1-S2, regular, no murmur auscultated Lungs-clear to auscultation bilaterally, no wheezing or crackles auscultated Abdomen-soft, mild tenderness in epigastric region Extremities-no edema in the lower extremities Neuro-alert, oriented x3, no focal deficit noted   Status is: Inpatient:             Meredeth Ide   [facility] If 7PM-7AM, please contact night-coverage at [email or web address], Office  [phone]   03/27/2023, 9:08 AM  LOS: 0  days

## 2023-03-27 NOTE — Plan of Care (Signed)

## 2023-03-27 NOTE — Progress Notes (Signed)
 Transferred to rm 1521 via w/c.  Pt A&O x4 and in no apparent distress.  Handoff given to Charlotte Sanes, LPN who is taking over her bedside care.

## 2023-03-27 NOTE — ED Triage Notes (Signed)
 Pt states that she was admitted to the hospital for cyclic vomiting and had to leave AMA today for a family emergency, continues to vomit, pt smells of mariajuana

## 2023-03-27 NOTE — Progress Notes (Signed)
 Pt reported that she needed to leave AMA immediately due to an emergency at her home. Reviewed AMA form and pt signed. IV removed. Instructed pt to return to ED at anytime for continued symptoms. Mick Sell RN

## 2023-03-27 NOTE — Progress Notes (Signed)
    Against Medical Advice   Notified by bedside RN that patient has left AMA.    I was unable to speak with patient regarding risks of leaving AMA as she had already left the building.  However, nursing staff educated and had patient's sign AMA paperwork on 03/27/2023 at 2020.  Per nursing assessment, patient was A/O x 4.   Anthoney Harada, DNP, ACNPC- AG [facility]

## 2023-03-28 LAB — URINALYSIS, ROUTINE W REFLEX MICROSCOPIC
Bilirubin Urine: NEGATIVE
Glucose, UA: NEGATIVE mg/dL
Hgb urine dipstick: NEGATIVE
Ketones, ur: 5 mg/dL — AB
Leukocytes,Ua: NEGATIVE
Nitrite: NEGATIVE
Protein, ur: NEGATIVE mg/dL
Specific Gravity, Urine: 1.015 (ref 1.005–1.030)
pH: 7 (ref 5.0–8.0)

## 2023-03-28 LAB — COMPREHENSIVE METABOLIC PANEL
ALT: 18 U/L (ref 0–44)
AST: 19 U/L (ref 15–41)
Albumin: 4.1 g/dL (ref 3.5–5.0)
Alkaline Phosphatase: 56 U/L (ref 38–126)
Anion gap: 9 (ref 5–15)
BUN: 11 mg/dL (ref 6–20)
CO2: 23 mmol/L (ref 22–32)
Calcium: 9 mg/dL (ref 8.9–10.3)
Chloride: 106 mmol/L (ref 98–111)
Creatinine, Ser: 0.98 mg/dL (ref 0.44–1.00)
GFR, Estimated: >60 mL/min (ref >60)
Glucose, Bld: 98 mg/dL (ref 70–99)
Potassium: 3.4 mmol/L — ABNORMAL LOW (ref 3.5–5.1)
Sodium: 138 mmol/L (ref 135–145)
Total Bilirubin: 0.8 mg/dL (ref 0.0–1.2)
Total Protein: 7.2 g/dL (ref 6.5–8.1)

## 2023-03-28 LAB — CBC
HCT: 38.7 % (ref 36.0–46.0)
Hemoglobin: 12.8 g/dL (ref 12.0–15.0)
MCH: 31.3 pg (ref 26.0–34.0)
MCHC: 33.1 g/dL (ref 30.0–36.0)
MCV: 94.6 fL (ref 80.0–100.0)
Platelets: 214 10*3/uL (ref 150–400)
RBC: 4.09 MIL/uL (ref 3.87–5.11)
RDW: 12.7 % (ref 11.5–15.5)
WBC: 4.8 10*3/uL (ref 4.0–10.5)
nRBC: 0 % (ref 0.0–0.2)

## 2023-03-28 LAB — LIPASE, BLOOD: Lipase: 27 U/L (ref 11–51)

## 2023-03-28 LAB — HCG, SERUM, QUALITATIVE: Preg, Serum: NEGATIVE

## 2023-03-28 MED ORDER — DICYCLOMINE HCL 10 MG/ML IM SOLN
20.0000 mg | Freq: Once | INTRAMUSCULAR | Status: DC
  Filled 2023-03-28: qty 2

## 2023-03-28 MED ORDER — PANTOPRAZOLE SODIUM 40 MG IV SOLR
40.0000 mg | Freq: Once | INTRAVENOUS | Status: AC
  Administered 2023-03-28: 40 mg via INTRAVENOUS
  Filled 2023-03-28: qty 10

## 2023-03-28 MED ORDER — SODIUM CHLORIDE 0.9 % IV BOLUS
1000.0000 mL | Freq: Once | INTRAVENOUS | Status: AC
  Administered 2023-03-28: 1000 mL via INTRAVENOUS

## 2023-03-28 MED ORDER — DIPHENHYDRAMINE HCL 50 MG/ML IJ SOLN
12.5000 mg | Freq: Once | INTRAMUSCULAR | Status: AC
  Administered 2023-03-28: 12.5 mg via INTRAVENOUS
  Filled 2023-03-28: qty 1

## 2023-03-28 MED ORDER — DROPERIDOL 2.5 MG/ML IJ SOLN: 1.2500 mg | Freq: Once | INTRAMUSCULAR | Status: DC

## 2023-03-28 MED ORDER — ONDANSETRON HCL 4 MG/2ML IJ SOLN
4.0000 mg | Freq: Once | INTRAMUSCULAR | Status: AC
  Administered 2023-03-28: 4 mg via INTRAVENOUS
  Filled 2023-03-28: qty 2

## 2023-03-28 MED ORDER — POTASSIUM CHLORIDE CRYS ER 20 MEQ PO TBCR
20.0000 meq | EXTENDED_RELEASE_TABLET | Freq: Once | ORAL | Status: AC
  Administered 2023-03-28: 20 meq via ORAL
  Filled 2023-03-28: qty 1

## 2023-03-28 NOTE — ED Notes (Signed)
 Pt refusing bentyl administration for pain, pt states she feels much better now and is ready to go home. MD made aware.

## 2023-03-28 NOTE — ED Provider Notes (Signed)
 [place] EMERGENCY DEPARTMENT AT [facility] Provider Note   CSN: [id number] Arrival date & time: 03/27/23  2303     History  Chief Complaint  Patient presents with   Emesis    Krista Stout is a 33 y.o. female history of hyperemesis cannabinoid syndrome, marijuana use, intractable nausea vomiting presented for intractable nausea vomiting.  Patient was just seen and left AMA yesterday as she states she was in better however when she got home she began vomiting again.  Patient states she cannot hold ice chips in her mouth without vomiting.  Patient denies any fevers, chest pain, shortness of breath, hematemesis.  Patient states she is here for her cyclic vomiting.  Patient states that Zofran along with Benadryl works the best.  Patient states she has not had marijuana in a month and a half.  Home Medications Prior to Admission medications   Medication Sig Start Date End Date Taking? Authorizing Provider  ondansetron (ZOFRAN) 8 MG tablet Take 8 mg by mouth every 8 (eight) hours as needed for nausea or vomiting.    [provider]      Allergies    Patient has no known allergies.    Review of Systems   Review of Systems  Gastrointestinal:  Positive for vomiting.    Physical Exam Updated Vital Signs BP 129/87   Pulse (!) 52   Temp 98.1 F (36.7 C) (Oral)   Resp (!) 25   SpO2 100%  Physical Exam Vitals reviewed.  Constitutional:      General: She is not in acute distress. HENT:     Head: Normocephalic and atraumatic.  Eyes:     Extraocular Movements: Extraocular movements intact.     Conjunctiva/sclera: Conjunctivae normal.     Pupils: Pupils are equal, round, and reactive to light.  Cardiovascular:     Rate and Rhythm: Normal rate and regular rhythm.     Pulses: Normal pulses.     Heart sounds: Normal heart sounds.     Comments: 2+ bilateral radial/dorsalis pedis pulses with regular rate Pulmonary:     Effort: Pulmonary effort is normal. No  respiratory distress.     Breath sounds: Normal breath sounds.  Abdominal:     Palpations: Abdomen is soft.     Tenderness: There is abdominal tenderness (Generalized). There is no guarding or rebound.  Musculoskeletal:        General: Normal range of motion.     Cervical back: Normal range of motion and neck supple.     Comments: 5 out of 5 bilateral grip/leg extension strength  Skin:    General: Skin is warm and dry.     Capillary Refill: Capillary refill takes less than 2 seconds.  Neurological:     General: No focal deficit present.     Mental Status: She is alert and oriented to person, place, and time.     Comments: Sensation intact in all 4 limbs  Psychiatric:        Mood and Affect: Mood normal.     ED Results / Procedures / Treatments   Labs (all labs ordered are listed, but only abnormal results are displayed) Labs Reviewed  COMPREHENSIVE METABOLIC PANEL - Abnormal; Notable for the following components:      Result Value   Potassium 3.4 (*)    All other components within normal limits  URINALYSIS, ROUTINE W REFLEX MICROSCOPIC - Abnormal; Notable for the following components:   Ketones, ur 5 (*)  All other components within normal limits  LIPASE, BLOOD  CBC  HCG, SERUM, QUALITATIVE    EKG None  Radiology CT ABDOMEN PELVIS W CONTRAST Result Date: 03/26/2023 CLINICAL DATA:  Acute, non localized abdominal pain EXAM: CT ABDOMEN AND PELVIS WITH CONTRAST TECHNIQUE: Multidetector CT imaging of the abdomen and pelvis was performed using the standard protocol following bolus administration of intravenous contrast. RADIATION DOSE REDUCTION: This exam was performed according to the departmental dose-optimization program which includes automated exposure control, adjustment of the mA and/or kV according to patient size and/or use of iterative reconstruction technique. CONTRAST:  OMNIPAQUE IOHEXOL 300 MG/ML  SOLN COMPARISON:  Prior CT scan of the abdomen and pelvis dated  07/11/2022 FINDINGS: Lower chest: No acute abnormality. Hepatobiliary: No focal liver abnormality is seen. No gallstones, gallbladder wall thickening, or biliary dilatation. Pancreas: Unremarkable. No pancreatic ductal dilatation or surrounding inflammatory changes. Spleen: No splenic injury or perisplenic hematoma. Adrenals/Urinary Tract: Adrenal glands are unremarkable. Kidneys are normal, without renal calculi, focal lesion, or hydronephrosis. Bladder is unremarkable. Stomach/Bowel: No evidence of obstruction or focal bowel wall thickening. Normal appendix in the right lower quadrant. The terminal ileum is unremarkable. Vascular/Lymphatic: No significant vascular findings are present. No enlarged abdominal or pelvic lymph nodes. Reproductive: Uterus and bilateral adnexa are unremarkable. Other: No abdominal wall hernia or abnormality. No abdominopelvic ascites. Musculoskeletal: No acute fracture or aggressive appearing lytic or blastic osseous lesion. IMPRESSION: No acute abnormality within the abdomen or pelvis. Electronically Signed   By: Malachy Moan M.D.   On: 03/26/2023 16:18    Procedures Procedures    Medications Ordered in ED Medications  dicyclomine (BENTYL) injection 20 mg (20 mg Intramuscular Patient Refused/Not Given 03/28/23 0438)  ondansetron (ZOFRAN) injection 4 mg (4 mg Intravenous Given 03/28/23 0351)  diphenhydrAMINE (BENADRYL) injection 12.5 mg (12.5 mg Intravenous Given 03/28/23 0350)  pantoprazole (PROTONIX) injection 40 mg (40 mg Intravenous Given 03/28/23 0352)  sodium chloride 0.9 % bolus 1,000 mL (0 mLs Intravenous Stopped 03/28/23 0429)  potassium chloride SA (KLOR-CON M) CR tablet 20 mEq (20 mEq Oral Given 03/28/23 0440)    ED Course/ Medical Decision Making/ A&P                                 Medical Decision Making Amount and/or Complexity of Data Reviewed Labs: ordered.  Risk Prescription drug management.   Krista Stout 33 y.o. presented today for  abdominal pain.  Working DDx that I considered at this time includes, but not limited to, cyclic vomiting/hyperemesis, gastroenteritis, colitis, small bowel obstruction, appendicitis, cholecystitis, hepatobiliary pathology, gastritis, PUD, ACS, aortic dissection, diverticulosis/diverticulitis, pancreatitis, nephrolithiasis, medication induced, AAA, UTI, pyelonephritis, ruptured ectopic pregnancy, PID, ovarian torsion.  R/o DDx: gastroenteritis, colitis, small bowel obstruction, appendicitis, cholecystitis, hepatobiliary pathology, gastritis, PUD, ACS, aortic dissection, diverticulosis/diverticulitis, pancreatitis, nephrolithiasis, medication induced, AAA, UTI, pyelonephritis, ruptured ectopic pregnancy, PID, ovarian torsion: These are considered less likely due to history of present illness, physical exam, labs/imaging findings.  Review of prior external notes: 08/27/2017 ED  Unique Tests and My Independent Interpretation:  CBC with differential: Unremarkable CMP: Unremarkable Lipase: Unremarkable UA: Unremarkable Serum hCG qualitative: Negative EKG: Rate, rhythm, axis, intervals all examined and without medically relevant abnormality. ST segments without concerns for elevations  Social Determinants of Health: EtOH/Substance Abuse  Discussion with Independent Historian:  Significant other  Discussion of Management of Tests: None  Risk: Medium: prescription drug management  Risk Stratification Score:  None   Plan: On exam patient was no acute distress with stable vitals.  Patient does have generalized tenderness on exam without peritoneal signs.  Patient did have CT scan done a few days ago that was ultimately negative.  Patient was admitted for intractable nausea vomiting and ultimately left AMA.  Patient requested Zofran along with Benadryl she states this works the best so we will trial this first.  Labs in triage are ultimately reassuring.  At this time do not feel need to repeat advanced  imaging and will try to control patient's symptoms.  Patient requested she be discharged as she is feels better.  Will discharge patient with primary care follow-up.  Patient is safe to be discharged at this time.  Patient was given return precautions. Patient stable for discharge at this time.  Patient verbalized understanding of plan.  This chart was dictated using voice recognition software.  Despite best efforts to proofread,  errors can occur which can change the documentation meaning.         Final Clinical Impression(s) / ED Diagnoses Final diagnoses:  Generalized abdominal pain    Rx / DC Orders ED Discharge Orders     None         Netta Corrigan, PA-C 03/28/23 0445    Coral Spikes, DO 03/28/23 [phone]

## 2023-03-28 NOTE — Discharge Instructions (Signed)
 Please follow-up with your primary care provider in regards to symptoms and ER visit. You have been seen in the Emergency Department (ED) for abdominal pain.  Your labs were reassuring. Please follow up as instructed above regarding today's emergent visit and the symptoms that are bothering you. You may take Tylenol every 6 hours as needed for pain.  Return to the ED if your abdominal pain worsens or fails to improve, you develop bloody vomiting, bloody diarrhea, you are unable to tolerate fluids due to vomiting, fever greater than 101, or other symptoms that concern you.

## 2023-09-22 ENCOUNTER — Encounter (HOSPITAL_COMMUNITY): Payer: Self-pay | Admitting: *Deleted

## 2023-09-22 ENCOUNTER — Ambulatory Visit (HOSPITAL_COMMUNITY)
Admission: EM | Admit: 2023-09-22 | Discharge: 2023-09-22 | Disposition: A | Discharge status: Home / Self Care | Payer: Self-pay | Attending: Internal Medicine | Admitting: Internal Medicine

## 2023-09-22 DIAGNOSIS — R110 Nausea: Secondary | ICD-10-CM

## 2023-09-22 DIAGNOSIS — R5383 Other fatigue: Secondary | ICD-10-CM

## 2023-09-22 DIAGNOSIS — R197 Diarrhea, unspecified: Secondary | ICD-10-CM

## 2023-09-22 LAB — POCT URINE PREGNANCY: Preg Test, Ur: NEGATIVE

## 2023-09-22 MED ORDER — PROMETHAZINE HCL 25 MG RE SUPP: 25.0000 mg | Freq: Four times a day (QID) | RECTAL | 1 refills | Status: AC | PRN

## 2023-09-22 MED ORDER — METOCLOPRAMIDE HCL 5 MG/ML IJ SOLN
INTRAMUSCULAR | Status: AC
  Filled 2023-09-22: qty 2

## 2023-09-22 MED ORDER — METOCLOPRAMIDE HCL 5 MG/ML IJ SOLN
5.0000 mg | Freq: Once | INTRAMUSCULAR | Status: AC
  Administered 2023-09-22: 5 mg via INTRAMUSCULAR

## 2023-09-22 NOTE — Discharge Instructions (Signed)
 Nausea and fatigue.  Fatigue is likely secondary to some aspect of dehydration.  Physical exam and vital signs are reassuring.  Due to the nausea and fatigue however we will order a complete metabolic panel, complete blood count and lipase.  These results will take approximately 24 hours.  If there are any abnormalities we will contact you and recommend further treatment.  For the nausea today we have given Reglan  5 mg IM injection and have sent in a prescription for promethazine  suppositories 25 mg every 6 hours as needed for nausea or vomiting.  Monitor symptoms, if there is significant worsening then would recommend going to the emergency room for further evaluation.  Make sure to stay hydrated even if you do not feel like taking in solid foods.

## 2023-09-22 NOTE — ED Provider Notes (Addendum)
 [facility]    CSN: [id number] Arrival date & time: 09/22/23  1656      History   Chief Complaint Chief Complaint  Patient presents with   Nausea   Fatigue    HPI Krista Stout is a 33 y.o. female.   34 year old female presents urgent care with complaints of nausea and fatigue.  She reports that the nausea started about 2 days ago.  She also had significant diarrhea on Saturday causing her to get very dehydrated.  She has continued to have trouble eating normally and has mostly been drinking fluids since then.  She relates that she has normally a very good appetite eating 3 large meals a day plus snacks but has only been able to take in fluids.  She has not had any significant vomiting although she does have a history of cyclic vomiting syndrome in which she takes Zofran  and Reglan  at times.  She has tried to take the Zofran  but it did not help.  She does have Reglan  but it is expired.  She denies any fevers, chills, cough, shortness of breath, dysuria, hematuria, vaginal discharge.  She has had a little bit of congestion.  She denies any sick contacts.     Past Medical History:  Diagnosis Date   Asthma    Cyclical vomiting syndrome    History of feeding tube 2021   [facility] in [place]    Patient Active Problem List   Diagnosis Date Noted   Gastroenteritis 03/26/2023   Malnutrition of moderate degree 07/17/2022   Intractable nausea and vomiting 07/11/2022   Hemorrhagic cyst of ovary 03/30/2022   Marijuana abuse 03/30/2022   Hyperemesis gravidarum 03/01/2022   Asthma with status asthmaticus    Pelvic mass 06/19/2018    Past Surgical History:  Procedure Laterality Date   INDUCED ABORTION  03/12/2022   LAPAROSCOPIC OOPHERECTOMY Right 2022   In [place]. Patient states fibroid had right ovary encased and both were removed; unsure if tube was removed, too   UMBILICAL HERNIA REPAIR     w/ mesh    OB History     Gravida  3   Para  2   Term   2   Preterm      AB  1   Living  2      SAB      IAB  1   Ectopic      Multiple      Live Births  2            Home Medications    Prior to Admission medications   Medication Sig Start Date End Date Taking? Authorizing Provider  promethazine  (PHENERGAN ) 25 MG suppository Place 1 suppository (25 mg total) rectally every 6 (six) hours as needed for nausea or vomiting. 09/22/23  Yes Brick Ketcher A, PA-C  ondansetron  (ZOFRAN ) 8 MG tablet Take 8 mg by mouth every 8 (eight) hours as needed for nausea or vomiting.    [provider]    Family History History reviewed. No pertinent family history.  Social History Social History   Tobacco Use   Smoking status: Former   Smokeless tobacco: Never  Advertising account planner   Vaping status: Never Used  Substance Use Topics   Alcohol use: No   Drug use: Yes    Types: Marijuana    Comment: former     Allergies   Patient has no known allergies.   Review of Systems Review of Systems  Constitutional:  Positive for fatigue. Negative for chills and fever.  HENT:  Positive for congestion. Negative for ear pain and sore throat.   Eyes:  Negative for pain and visual disturbance.  Respiratory:  Negative for cough and shortness of breath.   Cardiovascular:  Negative for chest pain and palpitations.  Gastrointestinal:  Positive for nausea. Negative for abdominal pain and vomiting.  Genitourinary:  Negative for dysuria and hematuria.  Musculoskeletal:  Negative for arthralgias and back pain.  Skin:  Negative for color change and rash.  Neurological:  Negative for seizures and syncope.  All other systems reviewed and are negative.    Physical Exam Triage Vital Signs ED Triage Vitals  Encounter Vitals Group     BP 09/22/23 1814 108/74     Girls Systolic BP Percentile --      Girls Diastolic BP Percentile --      Boys Systolic BP Percentile --      Boys Diastolic BP Percentile --      Pulse Rate 09/22/23 1814 65      Resp 09/22/23 1814 16     Temp 09/22/23 1814 97.9 F (36.6 C)     Temp Source 09/22/23 1814 Oral     SpO2 09/22/23 1814 99 %     Weight --      Height --      Head Circumference --      Peak Flow --      Pain Score 09/22/23 1812 0     Pain Loc --      Pain Education --      Exclude from Growth Chart --    No data found.  Updated Vital Signs BP 108/74 (BP Location: Left Arm)   Pulse 65   Temp 97.9 F (36.6 C) (Oral)   Resp 16   LMP 08/02/2023 (Exact Date)   SpO2 99%   Visual Acuity Right Eye Distance:   Left Eye Distance:   Bilateral Distance:    Right Eye Near:   Left Eye Near:    Bilateral Near:     Physical Exam Vitals and nursing note reviewed.  Constitutional:      General: She is not in acute distress.    Appearance: She is well-developed.  HENT:     Head: Normocephalic and atraumatic.     Right Ear: Tympanic membrane normal.     Left Ear: Tympanic membrane normal.     Nose: Nose normal.     Mouth/Throat:     Mouth: Mucous membranes are moist.  Eyes:     Conjunctiva/sclera: Conjunctivae normal.  Cardiovascular:     Rate and Rhythm: Normal rate and regular rhythm.     Heart sounds: No murmur heard. Pulmonary:     Effort: Pulmonary effort is normal. No respiratory distress.     Breath sounds: Normal breath sounds.  Abdominal:     Palpations: Abdomen is soft.     Tenderness: There is no abdominal tenderness. There is no guarding or rebound.     Hernia: No hernia is present.  Musculoskeletal:        General: No swelling.     Cervical back: Neck supple.  Skin:    General: Skin is warm and dry.     Capillary Refill: Capillary refill takes less than 2 seconds.  Neurological:     General: No focal deficit present.     Mental Status: She is alert.  Psychiatric:        Mood and Affect: Mood  normal.      UC Treatments / Results  Labs (all labs ordered are listed, but only abnormal results are displayed) Labs Reviewed  POCT URINE PREGNANCY     EKG   Radiology No results found.  Procedures Procedures (including critical care time)  Medications Ordered in UC Medications  metoCLOPramide  (REGLAN ) injection 5 mg (5 mg Intramuscular Given 09/22/23 1935)    Initial Impression / Assessment and Plan / UC Course  I have reviewed the triage vital signs and the nursing notes.  Pertinent labs & imaging results that were available during my care of the patient were reviewed by me and considered in my medical decision making (see chart for details).     Nausea  Other fatigue  Diarrhea of presumed infectious origin   Nausea and fatigue.  Fatigue is likely secondary to some aspect of dehydration.  Physical exam and vital signs are reassuring.  Due to the nausea and fatigue however we will order a complete metabolic panel, complete blood count and lipase.  These results will take approximately 24 hours.  If there are any abnormalities we will contact you and recommend further treatment.  For the nausea today we have given Reglan  5 mg IM injection and have sent in a prescription for promethazine  suppositories 25 mg every 6 hours as needed for nausea or vomiting.  Monitor symptoms, if there is significant worsening then would recommend going to the emergency room for further evaluation.  Make sure to stay hydrated even if you do not feel like taking in solid foods.    Addendum to visit: Several attempts were made to collect the patient's blood but we were unable to.  I discussed with the patient options of attempting 1 more time versus close monitoring of symptoms and if anything worsens to go to the emergency room.  The patient has opted to monitor her symptoms and go to the ER if things worsen.  Final Clinical Impressions(s) / UC Diagnoses   Final diagnoses:  Nausea  Other fatigue  Diarrhea of presumed infectious origin     Discharge Instructions      Nausea and fatigue.  Fatigue is likely secondary to some aspect of  dehydration.  Physical exam and vital signs are reassuring.  Due to the nausea and fatigue however we will order a complete metabolic panel, complete blood count and lipase.  These results will take approximately 24 hours.  If there are any abnormalities we will contact you and recommend further treatment.  For the nausea today we have given Reglan  5 mg IM injection and have sent in a prescription for promethazine  suppositories 25 mg every 6 hours as needed for nausea or vomiting.  Monitor symptoms, if there is significant worsening then would recommend going to the emergency room for further evaluation.  Make sure to stay hydrated even if you do not feel like taking in solid foods.     ED Prescriptions     Medication Sig Dispense Auth. Provider   promethazine  (PHENERGAN ) 25 MG suppository Place 1 suppository (25 mg total) rectally every 6 (six) hours as needed for nausea or vomiting. 20 each Teresa Almarie LABOR, PA-C      PDMP not reviewed this encounter.   Teresa Almarie LABOR, PA-C 09/22/23 1932    Teresa Almarie LABOR, PA-C 09/22/23 2025

## 2023-09-22 NOTE — ED Triage Notes (Signed)
 Pt states she has fatigue and nausea X 2 days. She hasn't been able to sleep she took melatonin and benadryl .   She had a miscarriage in 12/2022 and states her cycles have not been normal since.

## 2023-09-22 NOTE — ED Notes (Signed)
 Multiple attempts to draw labs without success. Patient hydrated but decided to monitor sx and d/c blood draw

## 2024-02-12 ENCOUNTER — Encounter (HOSPITAL_COMMUNITY): Payer: Self-pay

## 2024-02-12 ENCOUNTER — Emergency Department (HOSPITAL_COMMUNITY)
Admission: EM | Admit: 2024-02-12 | Discharge: 2024-02-12 | Disposition: A | Discharge status: Home / Self Care | Payer: Self-pay | Attending: Emergency Medicine | Admitting: Emergency Medicine

## 2024-02-12 DIAGNOSIS — R1115 Cyclical vomiting syndrome unrelated to migraine: Secondary | ICD-10-CM | POA: Insufficient documentation

## 2024-02-12 DIAGNOSIS — R112 Nausea with vomiting, unspecified: Secondary | ICD-10-CM | POA: Insufficient documentation

## 2024-02-12 DIAGNOSIS — J45909 Unspecified asthma, uncomplicated: Secondary | ICD-10-CM | POA: Insufficient documentation

## 2024-02-12 DIAGNOSIS — R1013 Epigastric pain: Secondary | ICD-10-CM | POA: Insufficient documentation

## 2024-02-12 LAB — CBC
HCT: 44.7 % (ref 36.0–46.0)
Hemoglobin: 14.9 g/dL (ref 12.0–15.0)
MCH: 31.8 pg (ref 26.0–34.0)
MCHC: 33.3 g/dL (ref 30.0–36.0)
MCV: 95.5 fL (ref 80.0–100.0)
Platelets: 244 10*3/uL (ref 150–400)
RBC: 4.68 MIL/uL (ref 3.87–5.11)
RDW: 13.2 % (ref 11.5–15.5)
WBC: 4.8 10*3/uL (ref 4.0–10.5)
nRBC: 0 % (ref 0.0–0.2)

## 2024-02-12 LAB — RESP PANEL BY RT-PCR (RSV, FLU A&B, COVID)  RVPGX2
Influenza A by PCR: NEGATIVE
Influenza B by PCR: NEGATIVE
Resp Syncytial Virus by PCR: NEGATIVE
SARS Coronavirus 2 by RT PCR: NEGATIVE

## 2024-02-12 LAB — URINALYSIS, ROUTINE W REFLEX MICROSCOPIC
Bilirubin Urine: NEGATIVE
Glucose, UA: NEGATIVE mg/dL
Hgb urine dipstick: NEGATIVE
Ketones, ur: NEGATIVE mg/dL
Leukocytes,Ua: NEGATIVE
Nitrite: NEGATIVE
Protein, ur: NEGATIVE mg/dL
Specific Gravity, Urine: 1.023 (ref 1.005–1.030)
pH: 5 (ref 5.0–8.0)

## 2024-02-12 LAB — LIPASE, BLOOD: Lipase: 21 U/L (ref 11–51)

## 2024-02-12 LAB — COMPREHENSIVE METABOLIC PANEL WITH GFR
ALT: 13 U/L (ref 0–44)
AST: 17 U/L (ref 15–41)
Albumin: 4.3 g/dL (ref 3.5–5.0)
Alkaline Phosphatase: 69 U/L (ref 38–126)
Anion gap: 9 (ref 5–15)
BUN: 9 mg/dL (ref 6–20)
CO2: 25 mmol/L (ref 22–32)
Calcium: 9.5 mg/dL (ref 8.9–10.3)
Chloride: 104 mmol/L (ref 98–111)
Creatinine, Ser: 0.88 mg/dL (ref 0.44–1.00)
GFR, Estimated: >60 mL/min
Glucose, Bld: 102 mg/dL — ABNORMAL HIGH (ref 70–99)
Potassium: 4.3 mmol/L (ref 3.5–5.1)
Sodium: 138 mmol/L (ref 135–145)
Total Bilirubin: 0.4 mg/dL (ref 0.0–1.2)
Total Protein: 7.4 g/dL (ref 6.5–8.1)

## 2024-02-12 LAB — HCG, SERUM, QUALITATIVE: Preg, Serum: NEGATIVE

## 2024-02-12 MED ORDER — SODIUM CHLORIDE 0.9 % IV BOLUS
1000.0000 mL | Freq: Once | INTRAVENOUS | Status: AC
  Administered 2024-02-12: 1000 mL via INTRAVENOUS

## 2024-02-12 MED ORDER — DIPHENHYDRAMINE HCL 50 MG/ML IJ SOLN
12.5000 mg | Freq: Once | INTRAMUSCULAR | Status: AC
  Administered 2024-02-12: 12.5 mg via INTRAVENOUS
  Filled 2024-02-12: qty 1

## 2024-02-12 MED ORDER — METOCLOPRAMIDE HCL 5 MG/ML IJ SOLN
10.0000 mg | Freq: Once | INTRAMUSCULAR | Status: AC
  Administered 2024-02-12: 10 mg via INTRAVENOUS
  Filled 2024-02-12: qty 2

## 2024-02-12 MED ORDER — DROPERIDOL 2.5 MG/ML IJ SOLN
1.2500 mg | Freq: Once | INTRAMUSCULAR | Status: DC
  Filled 2024-02-12: qty 2

## 2024-02-12 NOTE — ED Provider Notes (Signed)
 " [facility] Provider Note   CSN: [id number] Arrival date & time: 02/12/24  0830     Patient presents with: Emesis and Abdominal Pain   Krista Stout is a 34 y.o. female.   Pt is a 34 yo female with pmhx significant for cyclic vomiting syndrome, asthma, and marijuana use.  Pt does not feel her MJ use has any impact on her CVS.  Pt presents to the ED today with n/v.  She can't keep anything down.  She has tried several nausea meds at home without improvement in sx.        Prior to Admission medications  Medication Sig Start Date End Date Taking? Authorizing Provider  ondansetron  (ZOFRAN ) 8 MG tablet Take 8 mg by mouth every 8 (eight) hours as needed for nausea or vomiting.    [provider]  promethazine  (PHENERGAN ) 25 MG suppository Place 1 suppository (25 mg total) rectally every 6 (six) hours as needed for nausea or vomiting. 09/22/23   Teresa Almarie LABOR, PA-C    Allergies: Patient has no known allergies.    Review of Systems  Gastrointestinal:  Positive for nausea and vomiting.  All other systems reviewed and are negative.   Updated Vital Signs BP 99/61 (BP Location: Right Arm)   Pulse 70   Temp 98.7 F (37.1 C) (Oral)   Resp 16   Ht 5' 2 (1.575 m)   Wt 59.9 kg   SpO2 100%   BMI 24.14 kg/m   Physical Exam Vitals and nursing note reviewed.  Constitutional:      Appearance: She is well-developed.  HENT:     Head: Normocephalic and atraumatic.     Mouth/Throat:     Mouth: Mucous membranes are dry.     Pharynx: Oropharynx is clear.  Eyes:     Extraocular Movements: Extraocular movements intact.     Pupils: Pupils are equal, round, and reactive to light.  Cardiovascular:     Rate and Rhythm: Normal rate and regular rhythm.  Pulmonary:     Effort: Pulmonary effort is normal.     Breath sounds: Normal breath sounds.  Abdominal:     General: Abdomen is flat. Bowel sounds are normal.     Palpations:  Abdomen is soft.     Tenderness: There is abdominal tenderness in the epigastric area.  Skin:    General: Skin is warm.     Capillary Refill: Capillary refill takes less than 2 seconds.  Neurological:     General: No focal deficit present.     Mental Status: She is alert and oriented to person, place, and time.  Psychiatric:        Mood and Affect: Mood normal.        Behavior: Behavior normal.     (all labs ordered are listed, but only abnormal results are displayed) Labs Reviewed  COMPREHENSIVE METABOLIC PANEL WITH GFR - Abnormal; Notable for the following components:      Result Value   Glucose, Bld 102 (*)    All other components within normal limits  URINALYSIS, ROUTINE W REFLEX MICROSCOPIC - Abnormal; Notable for the following components:   APPearance HAZY (*)    All other components within normal limits  RESP PANEL BY RT-PCR (RSV, FLU A&B, COVID)  RVPGX2  LIPASE, BLOOD  CBC  HCG, SERUM, QUALITATIVE    EKG: None  Radiology: No results found.   Procedures   Medications Ordered in the ED  sodium chloride  0.9 % bolus 1,000 mL (0 mLs Intravenous Stopped 02/12/24 1548)  metoCLOPramide  (REGLAN ) injection 10 mg (10 mg Intravenous Given 02/12/24 1456)  diphenhydrAMINE  (BENADRYL ) injection 12.5 mg (12.5 mg Intravenous Given 02/12/24 1456)                                    Medical Decision Making Amount and/or Complexity of Data Reviewed Labs: ordered.  Risk Prescription drug management.   This patient presents to the ED for concern of n/v, this involves an extensive number of treatment options, and is a complaint that carries with it a high risk of complications and morbidity.  The differential diagnosis includes cvs, pregnancy, infection   Co morbidities that complicate the patient evaluation  cyclic vomiting syndrome, asthma, and marijuana use   Additional history obtained:  Additional history obtained from epic chart review External records from outside  source obtained and reviewed including sig other   Lab Tests:  I Ordered, and personally interpreted labs.  The pertinent results include:  cbc nl, cmp nl, ua neg, preg neg, covid/flu/rsv neg  Medicines ordered and prescription drug management:  I ordered medication including ivfs/reglan /benadryl   for sx  Reevaluation of the patient after these medicines showed that the patient improved I have reviewed the patients home medicines and have made adjustments as needed  Problem List / ED Course:  CVS:  after fluids and meds, pt feels much better.  She is able to eat some [employer].  She is told that MJ use has been a cause of CVS.  She has nausea meds at home and does not want another rx.  She is stable for d/c.  Return if worse.    Reevaluation:  After the interventions noted above, I reevaluated the patient and found that they have :improved   Social Determinants of Health:  Lives at home   Dispostion:  After consideration of the diagnostic results and the patients response to treatment, I feel that the patent would benefit from discharge with outpatient f/u.       Final diagnoses:  Cyclic vomiting syndrome    ED Discharge Orders     None          Dean Clarity, MD 02/13/24 [phone]  "

## 2024-02-12 NOTE — ED Triage Notes (Signed)
 Pt c/o emesis and abdominal pain x2 days.  Pain score 8/10.      Pt reports she has been seen for the same several times previously.  Pt reports she has been taking prescribed medications w/o relief.   Pt reports she has been seen by GI and had an endoscopy w/ no diagnosis.
